# Patient Record
Sex: Female | Born: 1985 | Race: White | Hispanic: No | Smoking: Current every day smoker
Health system: Southern US, Community
[De-identification: ages and names within clinical notes are randomized; demographics above are authoritative.]

## PROBLEM LIST (undated history)

## (undated) DIAGNOSIS — C4490 Unspecified malignant neoplasm of skin, unspecified: Secondary | ICD-10-CM

## (undated) DIAGNOSIS — K589 Irritable bowel syndrome without diarrhea: Secondary | ICD-10-CM

## (undated) DIAGNOSIS — K219 Gastro-esophageal reflux disease without esophagitis: Secondary | ICD-10-CM

## (undated) DIAGNOSIS — F329 Major depressive disorder, single episode, unspecified: Secondary | ICD-10-CM

## (undated) DIAGNOSIS — M1990 Unspecified osteoarthritis, unspecified site: Secondary | ICD-10-CM

## (undated) DIAGNOSIS — R112 Nausea with vomiting, unspecified: Secondary | ICD-10-CM

## (undated) DIAGNOSIS — Z800 Family history of malignant neoplasm of digestive organs: Secondary | ICD-10-CM

## (undated) DIAGNOSIS — F419 Anxiety disorder, unspecified: Secondary | ICD-10-CM

## (undated) DIAGNOSIS — K529 Noninfective gastroenteritis and colitis, unspecified: Secondary | ICD-10-CM

## (undated) DIAGNOSIS — R197 Diarrhea, unspecified: Secondary | ICD-10-CM

## (undated) DIAGNOSIS — Z9889 Other specified postprocedural states: Secondary | ICD-10-CM

## (undated) DIAGNOSIS — N83209 Unspecified ovarian cyst, unspecified side: Secondary | ICD-10-CM

## (undated) DIAGNOSIS — J309 Allergic rhinitis, unspecified: Secondary | ICD-10-CM

## (undated) DIAGNOSIS — S6722XA Crushing injury of left hand, initial encounter: Secondary | ICD-10-CM

## (undated) DIAGNOSIS — M069 Rheumatoid arthritis, unspecified: Secondary | ICD-10-CM

## (undated) DIAGNOSIS — I10 Essential (primary) hypertension: Secondary | ICD-10-CM

## (undated) DIAGNOSIS — F909 Attention-deficit hyperactivity disorder, unspecified type: Secondary | ICD-10-CM

## (undated) DIAGNOSIS — G43909 Migraine, unspecified, not intractable, without status migrainosus: Secondary | ICD-10-CM

## (undated) DIAGNOSIS — M5417 Radiculopathy, lumbosacral region: Secondary | ICD-10-CM

## (undated) DIAGNOSIS — D510 Vitamin B12 deficiency anemia due to intrinsic factor deficiency: Secondary | ICD-10-CM

## (undated) DIAGNOSIS — F32A Depression, unspecified: Secondary | ICD-10-CM

## (undated) DIAGNOSIS — N809 Endometriosis, unspecified: Secondary | ICD-10-CM

## (undated) HISTORY — DX: Vitamin B12 deficiency anemia due to intrinsic factor deficiency: D51.0

## (undated) HISTORY — DX: Diarrhea, unspecified: R19.7

## (undated) HISTORY — DX: Unspecified osteoarthritis, unspecified site: M19.90

## (undated) HISTORY — PX: FRACTURE SURGERY: SHX138

## (undated) HISTORY — DX: Unspecified malignant neoplasm of skin, unspecified: C44.90

## (undated) HISTORY — DX: Irritable bowel syndrome, unspecified: K58.9

## (undated) HISTORY — DX: Family history of malignant neoplasm of digestive organs: Z80.0

## (undated) HISTORY — DX: Allergic rhinitis, unspecified: J30.9

## (undated) HISTORY — DX: Rheumatoid arthritis, unspecified: M06.9

## (undated) HISTORY — DX: Endometriosis, unspecified: N80.9

## (undated) HISTORY — PX: WISDOM TOOTH EXTRACTION: SHX21

## (undated) HISTORY — DX: Radiculopathy, lumbosacral region: M54.17

## (undated) HISTORY — DX: Essential (primary) hypertension: I10

## (undated) HISTORY — DX: Crushing injury of left hand, initial encounter: S67.22XA

## (undated) HISTORY — DX: Migraine, unspecified, not intractable, without status migrainosus: G43.909

## (undated) HISTORY — PX: SMALL INTESTINE SURGERY: SHX150

## (undated) HISTORY — DX: Noninfective gastroenteritis and colitis, unspecified: K52.9

## (undated) HISTORY — DX: Major depressive disorder, single episode, unspecified: F32.9

## (undated) HISTORY — DX: Unspecified ovarian cyst, unspecified side: N83.209

## (undated) HISTORY — DX: Attention-deficit hyperactivity disorder, unspecified type: F90.9

## (undated) HISTORY — PX: NASAL SINUS SURGERY: SHX719

## (undated) HISTORY — DX: Depression, unspecified: F32.A

## (undated) HISTORY — DX: Anxiety disorder, unspecified: F41.9

## (undated) HISTORY — PX: HAND SURGERY: SHX662

---
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
## 2006-04-28 DIAGNOSIS — N941 Unspecified dyspareunia: Secondary | ICD-10-CM | POA: Insufficient documentation

## 2006-12-28 ENCOUNTER — Emergency Department (HOSPITAL_COMMUNITY): Admission: EM | Admit: 2006-12-28 | Discharge: 2006-12-28 | Payer: Self-pay | Admitting: Emergency Medicine

## 2007-04-18 ENCOUNTER — Encounter: Admission: RE | Admit: 2007-04-18 | Discharge: 2007-04-18 | Payer: Self-pay | Admitting: Orthopaedic Surgery

## 2008-01-30 ENCOUNTER — Emergency Department (HOSPITAL_COMMUNITY): Admission: EM | Admit: 2008-01-30 | Discharge: 2008-01-31 | Payer: Self-pay | Admitting: Emergency Medicine

## 2008-06-06 ENCOUNTER — Emergency Department (HOSPITAL_COMMUNITY): Admission: EM | Admit: 2008-06-06 | Discharge: 2008-06-06 | Payer: Self-pay | Admitting: Family Medicine

## 2008-08-20 ENCOUNTER — Emergency Department (HOSPITAL_COMMUNITY): Admission: EM | Admit: 2008-08-20 | Discharge: 2008-08-20 | Payer: Self-pay | Admitting: Family Medicine

## 2008-10-26 HISTORY — PX: APPENDECTOMY: SHX54

## 2008-11-08 ENCOUNTER — Encounter (INDEPENDENT_AMBULATORY_CARE_PROVIDER_SITE_OTHER): Payer: Self-pay | Admitting: General Surgery

## 2008-11-08 ENCOUNTER — Observation Stay (HOSPITAL_COMMUNITY): Admission: EM | Admit: 2008-11-08 | Discharge: 2008-11-09 | Payer: Self-pay | Admitting: Emergency Medicine

## 2010-08-04 LAB — URINALYSIS, ROUTINE W REFLEX MICROSCOPIC
Bilirubin Urine: NEGATIVE
Glucose, UA: NEGATIVE mg/dL
Hgb urine dipstick: NEGATIVE
Ketones, ur: 15 mg/dL — AB
Protein, ur: NEGATIVE mg/dL

## 2010-08-04 LAB — GC/CHLAMYDIA PROBE AMP, GENITAL
Chlamydia, DNA Probe: NEGATIVE
GC Probe Amp, Genital: NEGATIVE

## 2010-08-04 LAB — COMPREHENSIVE METABOLIC PANEL
ALT: 20 U/L (ref 0–35)
AST: 27 U/L (ref 0–37)
Albumin: 4.1 g/dL (ref 3.5–5.2)
Alkaline Phosphatase: 42 U/L (ref 39–117)
BUN: 14 mg/dL (ref 6–23)
CO2: 22 mEq/L (ref 19–32)
Calcium: 9.7 mg/dL (ref 8.4–10.5)
Chloride: 107 mEq/L (ref 96–112)
Creatinine, Ser: 1.07 mg/dL (ref 0.4–1.2)
GFR calc Af Amer: >60 mL/min (ref >60)
GFR calc non Af Amer: >60 mL/min (ref >60)
Glucose, Bld: 108 mg/dL — ABNORMAL HIGH (ref 70–99)
Potassium: 4 mEq/L (ref 3.5–5.1)
Sodium: 138 mEq/L (ref 135–145)
Total Bilirubin: 0.9 mg/dL (ref 0.3–1.2)
Total Protein: 7.4 g/dL (ref 6.0–8.3)

## 2010-08-04 LAB — CBC
HCT: 42.7 % (ref 36.0–46.0)
Hemoglobin: 15 g/dL (ref 12.0–15.0)
MCHC: 35.2 g/dL (ref 30.0–36.0)
MCV: 87.1 fL (ref 78.0–100.0)
Platelets: 193 10*3/uL (ref 150–400)
RBC: 4.9 MIL/uL (ref 3.87–5.11)
RDW: 12.8 % (ref 11.5–15.5)
WBC: 9.7 10*3/uL (ref 4.0–10.5)

## 2010-08-04 LAB — DIFFERENTIAL
Basophils Absolute: 0.1 10*3/uL (ref 0.0–0.1)
Basophils Relative: 1 % (ref 0–1)
Eosinophils Absolute: 0.1 10*3/uL (ref 0.0–0.7)
Eosinophils Relative: 1 % (ref 0–5)
Lymphocytes Relative: 16 % (ref 12–46)
Lymphs Abs: 1.5 10*3/uL (ref 0.7–4.0)
Monocytes Absolute: 0.6 10*3/uL (ref 0.1–1.0)
Monocytes Relative: 7 % (ref 3–12)
Neutro Abs: 7.3 10*3/uL (ref 1.7–7.7)
Neutrophils Relative %: 76 % (ref 43–77)

## 2010-08-04 LAB — POCT PREGNANCY, URINE: Preg Test, Ur: NEGATIVE

## 2010-08-04 LAB — URINE CULTURE: Culture: NO GROWTH

## 2010-08-04 LAB — LIPASE, BLOOD: Lipase: 19 U/L (ref 11–59)

## 2010-09-10 NOTE — H&P (Signed)
[name], [name] NO.:  [id number]   MEDICAL RECORD NO.:  [id number]          PATIENT TYPE:  OBV   LOCATION:  5151                         FACILITY:  [facility]   PHYSICIAN:  Anselm Pancoast. Weatherly, M.D.DATE OF BIRTH:  [birth year]   DATE OF ADMISSION:  11/08/2008  DATE OF DISCHARGE:                              HISTORY & PHYSICAL   PRIMARY CARE PHYSICIAN:  Tammy R. Collins Scotland, MD   CHIEF COMPLAINT:  Right lower quadrant abdominal pain.   HISTORY OF PRESENT ILLNESS:  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  She was awakened  from sleep with upper abdominal pain more to the right upper and right  middle quadrants which was very severe in nature, began having nausea  and vomiting x1 and has had dry heaves since.  Later, she developed more  of a bilateral left lower quadrant pain, increased intensity, presented  to the ER.  White count was normal.  LFTs were normal.  Ultrasound was  negative for any gallbladder or other biliary issue.  Continued to have  pain, became more focused in the right lower quadrant, and clinical exam  also concurred with this.  So, EDP later ordered a CT of the abdomen and  pelvis which demonstrated acute appendicitis with an appendicolith.  Appendix was borderline and enlarged to 7 mm.  Previous pelvic exam  showed no evidence of cervical motion tenderness or PID.  Surgical  consultation has been requested.   PAST MEDICAL HISTORY:  1. Irritable bowel syndrome.  2. Ovarian cyst.  3. Mild depression and anxiety.   PAST SURGICAL HISTORY:  Back surgery.   FAMILY HISTORY:  Noncontributory.   SOCIAL HISTORY:  No alcohol.  No tobacco.  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  She also works as a Agricultural engineer  here at [employer] as previously mentioned.   DRUG ALLERGIES:  NKDA.   CURRENT MEDICATIONS:  She has been placed on citalopram by Dr. Alda Berthold  for anxiety and  depression issues.  She has had increasing stress with  her school work load.  Knows the citalopram had given her gastric  problems, so she came back to be reevaluated at the primary care  physician's office.  Dr. Collins Scotland was not there and the person who was  there ordered Seroquel.  The patient was reluctant to take this because  of side effects and has not started yet.   REVIEW OF SYSTEMS:  As per the history of present illness.  GI:  She has  had nausea and vomiting as described x1.  No chronic GI issues other  than the irritable bowel syndrome.  These symptoms are different than  her irritable bowel syndrome symptoms.  CONSTITUTIONAL:  She has not had  any fevers, chills, or myalgias.  GYN:  The patient has recurrent  ovarian cyst.  She is 2 weeks since her last menstrual period.  These  symptoms are not consistent with prior ovarian cyst symptoms.   PHYSICAL EXAMINATION:  GENERAL:  Pleasant but acutely ill-appearing  female patient  with a flushed-appearing face and complaining of severe  right lower quadrant abdominal pain.  VITAL SIGNS:  Temperature 98.3, BP 127/76, pulse 97 and regular, and  respirations 20.  PSYCH:  The patient is alert and oriented x3.  Affect appropriate to  current situation.  NEUROLOGIC:  Cranial nerves II-XII are grossly intact.  She is moving  all extremities x4 without any focal neurological deficits.  EYES:  Sclerae are mildly injected, nonicteric bilaterally.  EARS, NOSE, AND THROAT:  Ears are symmetrical.  No otorrhea.  Nose is  midline.  No rhinorrhea.  Oral mucous membranes are pink but dry.  CHEST:  Bilateral lung sounds are clear to auscultation anteriorly.  Respiratory effort is nonlabored.  She is on room air.  CARDIOVASCULAR:  Heart sounds are S1-S2.  No rubs, murmurs, clicks, or  gallops.  No JVD.  No peripheral edema.  ABDOMEN:  Soft, slightly distended.  Diminished bowel sounds.  Exquisitely tender in the right lower quadrant with voluntary  guarding  and some rebounding.  She has an umbilical piercing which I asked her to  remove and she gave this to her mother preoperatively.  EXTREMITIES:  Symmetrical in appearance without cyanosis or clubbing.   LABORATORY DATA:  White count 9700, neutrophils 76%, hemoglobin 15, and  platelets 193,000.  Urinalysis negative.  Wet prep only shows a few  WBCs, otherwise negative.  Sodium 138, potassium 4.0, CO2 22, glucose  108, BUN 14, and creatinine 1.07.  Lipase 19.  Ultrasound was negative.  CT again demonstrates the enlarged appendix at 7 mm with an  appendicolith consistent with early appendicitis.   IMPRESSION:  Early acute appendicitis.   PLAN:  Admit at this point on outpatient status.   PLAN:  1. Operative intervention today by Dr. Zachery Dakins.  Risks and benefits      of the procedure will be discussed per Dr. Zachery Dakins and the      patient will proceed pending acceptance.  2. Management of symptoms with IV fluids, IV narcotics, and IV      antiemetics.  3. Empiric Zosyn IV for broad-spectrum empiric antibiotic coverage.      Allison L. Rennis Harding, N.P.      Anselm Pancoast. Zachery Dakins, M.D.  Electronically Signed    ALE/MEDQ  D:  11/08/2008  T:  11/09/2008  Job:  [id number]   cc:   Tammy R. Collins Scotland, M.D.

## 2010-09-10 NOTE — Op Note (Signed)
Norma Harmon, [name] NO.:  [id number]   MEDICAL RECORD NO.:  [id number]          PATIENT TYPE:  OBV   LOCATION:  5151                         FACILITY:  [facility]   PHYSICIAN:  Anselm Pancoast. Weatherly, M.D.DATE OF BIRTH:  May 02, 1985   DATE OF PROCEDURE:  11/08/2008  DATE OF DISCHARGE:                               OPERATIVE REPORT   PREOPERATIVE DIAGNOSIS:  Acute appendicitis.   POSTOPERATIVE DIAGNOSIS:  Acute appendicitis.   OPERATION:  Laparoscopic appendectomy.   SURGEON:  Anselm Pancoast. Zachery Dakins, MD   ASSISTANT:  Letha Cape, PA   HISTORY:  Norma Harmon is a 25 year old female.  I think she is  actually a part-time employee here at [employer] who started having  abdominal pain yesterday, it kind of persisted, first it was kind of  more generalized and then kind of shifted to the lower right abdomen.  She came to the emergency room this morning, was seen by the ER  physician.  Originally, kind of wondered if this could be biliary and I  think an ultrasound of the gallbladder was obtained.  White count was  not elevated, but her pain was getting more in the lower abdomen, then a  CT was performed, which showed an enlarged appendix with fecalith in the  right lower quadrant.  The patient has had some previous issues with  abdominal pain and thought to be an ovarian cyst, but there was nothing  significant noted in the ovaries at this time.  We were asked to see her  and I agreed with the diagnosis and recommended to proceed with a  laparoscopic appendectomy.  The patient had no additional questions.  She was given 1 dose of Zosyn immediately preoperatively and taken to  the operative suite.  Induction of general anesthesia, endotracheal  tube, oral tube into the stomach, an a sterile Foley catheter was  inserted sterilely and then the abdomen was prepped with Betadine  solution and draped in the sterile manner.  A small incision was made  below the  umbilicus.  She has got real strong fascial layer and two  little Kocher were placed along the fascia of the underlying posterior  peritoneum, was picked up between 2 hemostats and then opened with a 15-  blade and then a pursestring suture of 0 Vicryl placed.  Hasson cannula  was introduced.  She has got a real low lying cecum and the 5-mm trocar  was placed under direct vision in the right lateral to the umbilicus and  the 10-11 was placed in the left lower quadrant under direct vision.  I  then was able to use a glass that allowed to kind of flipped the cecum  up, kind of manipulated the small bowel, so I could see this markedly  inflamed appendix, and the mesentery of the appendix was grasped and  then elevated.  Then with a Harmonic scalpel I divided the mesentery  under direct vision and it was inflamed right about 1 cm of this  junction with the most proximal portion of the appendix.  The mesentery  was divided, so I could get  a stapler across the base of the appendix-  cecal junction, clamped it, satisfied with the position.  I had looked  at the appendix placing the camera at the umbilicus and I had the camera  predominately in the left lower quadrant.  The stapler was fired.  The  appendix was then placed in the EndoCatch bag and then brought out the  EndoCatch bag with the inflamed appendix.  We reinserted the Hasson  cannula, looked at the cecal base.  There was no evidence of any  bleeding.  The staple line was good, controlled, a little bit of  irrigating fluid was used.  Next, I looked at the uterus and the left  and right ovaries, did not take any pictures and there was no evidence  of any cyst or anything on the ovaries or any evidence of any  inflammation around the fallopian tubes.  The CO2 was released and the  left lower quadrant port was withdrawn under direct vision.  No  bleeding.  I later put a fascial stitch in the anterior fascia under  direct vision.  The 5-mm  port was withdrawn under direct vision and then  the CO2 released.  The 10 mm trocar at the umbilicus  was withdrawn, and I put an additional figure-of-eight 0 Vicryl fascia  at the umbilicus and then tied both sutures.  The patient tolerated the  procedure nicely.  I did put Marcaine in the incisions, 5-0 Vicryl  subcuticular sutures, and then Benzoin and Steri-Strips on the skin.  The patient will spend a night and should be able to be discharged.      Anselm Pancoast. Zachery Dakins, M.D.  Electronically Signed     Anselm Pancoast. Zachery Dakins, M.D.  Electronically Signed    WJW/MEDQ  D:  11/08/2008  T:  11/09/2008  Job:  [id number]

## 2010-09-13 NOTE — Op Note (Signed)
Norma Harmon, Norma Harmon NO.:  [id number]   MEDICAL RECORD NO.:  [id number]          PATIENT TYPE:  INP   LOCATION:  6123                         FACILITY:  [facility]   PHYSICIAN:  Sharolyn Douglas, M.D.        DATE OF BIRTH:  07-09-1985   DATE OF PROCEDURE:  09/18/2005  DATE OF DISCHARGE:                                 OPERATIVE REPORT   PREOPERATIVE DIAGNOSES:  1.  L5-S1 spondylolysis.  2.  L5-S1 degenerative disc disease.  3.  L5-S1 facette arthropathy.   POSTOPERATIVE DIAGNOSES:  1.  L5-S1 spondylolysis.  2.  L5-S1 degenerative disc disease.  3.  L5-S1 facette arthropathy.   PROCEDURES:  1.  L5-S1 posterior spinal fusion.  2.  L5-S1 pedicle screw instrumentation using the Abbott Spine Pathfinder      system.  3.  Right posterior iliac crest bone graft.   SURGEON:  Sharolyn Douglas, M.D.   ASSISTANT:  Verlin Fester, P.A.   ANESTHESIA:  General endotracheal.   ESTIMATED BLOOD LOSS:  200 cc.   COMPLICATIONS:  None.   INDICATIONS:  The patient is a pleasant 25 year old female with chronic  progressive back and right leg pain.  Her radiographs and CT scan show pars  anomalies and facette arthropathy at L5-S1.  She has failed to respond to  conservative treatment measures and this point has elected to undergo  surgery in hopes of improving her symptoms.  I had talked to them  preoperatively about pars repair versus L5-S1 fusion.  We had hoped on being  able to do the pars repair to save the L5-S1 disc.  Intraoperatively, I  found the L5-S1 facette joints to be severely degenerative, right greater  than left.  There was a pars defect noted on the right side.  The left pars  was intact.  It was felt that repairing this pars defect would not  completely address the pathology.  I therefore scrubbed out and spoke to the  patient's mother and father and consented for the L5-S1 fusion with pedicle  screws.  Risks, benefits, and alternatives were discussed.   PROCEDURE:  The  patient was identified in the holding area and taken to the  operating room and underwent general endotracheal anesthesia without  difficulty, and given prophylactic IV antibiotics.  Neuro monitoring was  established in the form of lower extremity SSEPs and EMGs.  She was turned  prone onto the Booth frame.  All bony prominences were padded.  Face and  eyes were protected all times.  Back prepped and draped in the usual sterile  fashion.  Fluoroscopy was brought into the field to identify the L5-S1  level.  A midline skin incision was made from L4 down to the sacrum.  Dissection was carried through the deep fascia.  Subperiosteal exposure  carried out, exposing the facette joints of L5-S1 and the pars  interarticularis.  We found on the right side the facette joint was  enlarged, with osteophytes projecting towards the L4-5 joint.  The left  facette joint was also enlarged.  There was definitely some degree of  atropism.  The pars region was then examined.  On the left side, the pars  was found to be intact. On the right, it was found to be elongated, and a  small defect was noted.  Because of the amount of facette arthritis and  hypertrophy at L5-S1, it was not felt that repairing the pars on the right  side would address the pathology.  I therefore scrubbed out and spoke with  the patient's family.  We had discussed in the office the possibility of  lumbar fusion, and they had a good understanding.  They were consented for  the procedure.  I then returned to the operating room and completed the  operation.  Further dissection was carried out over the transverse processes  of L5 and the sacral ala bilaterally.  The facette joints were osteotomized  and decorticated.  Pedicle screws were placed at L5 and S1 using anatomic  probing technique.  Each pedicle's starting point was initiated with the awl  followed by the pedicle probe.  The pedicle holes were palpated with a ball-  tip feeler.  There were no breaches.  Each pedicle hole was tapped.  We  placed 6.5 x 40 mm screws in L5 bilaterally, 7.5 x 35 mm screws in the  sacrum.  We had excellent screw purchase.  The bone quality was good.  We  then decorticated the transverse processes of L5 and the sacral ala  bilaterally.  We then turned our attention to obtaining posterior iliac  crest bone graft.  The subcutaneous layer was elevated out to the right  posterior iliac crest.  Dissection was carried down through the deep fascia,  exposing the iliac crest.  The iliac crest cap was removed with a Leksell.  Copious amounts bone graft were removed from between the tables using a  curette.  Wound was irrigated.  The fascia was closed with #1 Vicryl suture.  We then returned to the midline incision.  The autogenous bone graft was  packed tightly between the L5 transverse process and the sacral ala.  5 cc  of Grafton allograft was supplemented.  We then placed the 50 mm titanium  rod into the polyaxial screw heads.  The locking caps were sheared off.  The  wound was irrigated.  Deep fascia was closed with a #1 Vicryl suture.  Subcutaneous layer was closed with 2-0 Vicryl followed by a running 3-0  subcuticular Vicryl suture on the skin edges.  Benzoin and Steri-Strips  placed and sterile dressing applied.  The patient was turned supine,  extubated without difficulty, transferred to recovery in stable condition.  Needle and sponge count was correct.  My assistant helped throughout the  procedure, including positioning during exposure, helping with retraction  and suction, and also with wound closure.      Sharolyn Douglas, M.D.  Electronically Signed     MC/MEDQ  D:  09/18/2005  T:  09/19/2005  Job:  [id number]

## 2010-09-13 NOTE — H&P (Signed)
Norma Harmon, LATKA NO.:  [id number]   MEDICAL RECORD NO.:  [id number]          PATIENT TYPE:  INP   LOCATION:  6123                         FACILITY:  [facility]   PHYSICIAN:  Sharolyn Douglas, M.D.        DATE OF BIRTH:  August 19, 1985   DATE OF ADMISSION:  09/18/2005  DATE OF DISCHARGE:  09/20/2005                                HISTORY & PHYSICAL   CHIEF COMPLAINT:  Low back pain.   HISTORY OF PRESENT ILLNESS:  The patient is an 24 year old female who was  found to have a possible L5 pars fracture and has been having longstanding  back pain.  She has failed to improve with any conservative treatment and it  was thought her best course of management would be L5 pars fracture repair.  Risks and benefits of this procedure were discussed with the patient and her  family today by Dr. Noel Gerold as well as myself and they indicated understanding  and opted to proceed.   ALLERGIES:  None.   MEDICATIONS:  Erythromycin.   PAST MEDICAL HISTORY:  Healthy.   PAST SURGICAL HISTORY:  None.   SOCIAL HISTORY:  The patient denies tobacco or alcohol use.  She is single.  She is a Printmaker at [employer].   FAMILY MEDICAL HISTORY:  Noncontributory.   REVIEW OF SYSTEMS:  Negative.   PHYSICAL EXAMINATION:  VITAL SIGNS:  Pulse is 72 and regular, respirations  16 and unlabored, blood pressure is 118/72.  GENERAL APPEARANCE:  The patient is an 25 year old white female who is alert  and oriented, in no acute distress, well-nourished, well-groomed, appears  stated age, and was cooperative with exam.  HEENT:  Head is normocephalic, atraumatic.  Pupils are equal, round and  reactive to light and accommodation.  Extraocular movements intact.  Nares  patent.  Pharynx is clear.  NECK:  Soft to palpation, no lymphadenopathy or thyromegaly noted.  No  bruits appreciated.  CHEST:  Clear to auscultation bilaterally.  No rales, rhonchi, stridor or  wheezes, no friction rubs.  BREASTS:  Not  pertinent and not performed.  HEART:  S1 and S2.  Regular rate and rhythm.  No murmurs, gallops or rubs  noted.  ABDOMEN:  Soft to palpation, nontender, non-distended and no organomegaly  noted.  Positive bowel sounds throughout.  GU:  Not pertinent and not performed.  EXTREMITIES:  As per HPI.  SKIN:  Intact, without any lesions or rashes.   LABORATORY AND ACCESSORY CLINICAL DATA:  CT suggests L5 pars fracture.   IMPRESSION:  L5 pars fracture with chronic back pain.   PLAN:  Admit to [facility] on Sep 18, 2005 for an L5 pars repair by  Dr. Noel Gerold.      Verlin Fester, P.A.      Sharolyn Douglas, M.D.  Electronically Signed    CM/MEDQ  D:  09/26/2005  T:  09/26/2005  Job:  [id number]

## 2010-11-17 ENCOUNTER — Emergency Department (HOSPITAL_COMMUNITY)
Admission: EM | Admit: 2010-11-17 | Discharge: 2010-11-17 | Disposition: A | Discharge status: Home / Self Care | Payer: 59 | Attending: Emergency Medicine | Admitting: Emergency Medicine

## 2010-11-17 ENCOUNTER — Emergency Department (HOSPITAL_COMMUNITY): Payer: 59

## 2010-11-17 ENCOUNTER — Emergency Department (HOSPITAL_COMMUNITY)
Admission: EM | Admit: 2010-11-17 | Discharge: 2010-11-18 | Disposition: A | Discharge status: Home / Self Care | Payer: 59 | Attending: Emergency Medicine | Admitting: Emergency Medicine

## 2010-11-17 DIAGNOSIS — R1031 Right lower quadrant pain: Secondary | ICD-10-CM | POA: Insufficient documentation

## 2010-11-17 DIAGNOSIS — R0602 Shortness of breath: Secondary | ICD-10-CM | POA: Insufficient documentation

## 2010-11-17 DIAGNOSIS — R109 Unspecified abdominal pain: Secondary | ICD-10-CM | POA: Insufficient documentation

## 2010-11-17 DIAGNOSIS — R112 Nausea with vomiting, unspecified: Secondary | ICD-10-CM | POA: Insufficient documentation

## 2010-11-17 DIAGNOSIS — K921 Melena: Secondary | ICD-10-CM | POA: Insufficient documentation

## 2010-11-17 DIAGNOSIS — R002 Palpitations: Secondary | ICD-10-CM | POA: Insufficient documentation

## 2010-11-17 DIAGNOSIS — R141 Gas pain: Secondary | ICD-10-CM | POA: Insufficient documentation

## 2010-11-17 DIAGNOSIS — R142 Eructation: Secondary | ICD-10-CM | POA: Insufficient documentation

## 2010-11-17 DIAGNOSIS — R197 Diarrhea, unspecified: Secondary | ICD-10-CM | POA: Insufficient documentation

## 2010-11-17 DIAGNOSIS — K589 Irritable bowel syndrome without diarrhea: Secondary | ICD-10-CM | POA: Insufficient documentation

## 2010-11-17 DIAGNOSIS — R110 Nausea: Secondary | ICD-10-CM | POA: Insufficient documentation

## 2010-11-17 DIAGNOSIS — K5289 Other specified noninfective gastroenteritis and colitis: Secondary | ICD-10-CM | POA: Insufficient documentation

## 2010-11-17 LAB — COMPREHENSIVE METABOLIC PANEL
ALT: 12 U/L (ref 0–35)
CO2: 25 mEq/L (ref 19–32)
Calcium: 9.5 mg/dL (ref 8.4–10.5)
Chloride: 104 mEq/L (ref 96–112)
GFR calc Af Amer: >60 mL/min (ref >60)
GFR calc non Af Amer: >60 mL/min (ref >60)
Glucose, Bld: 81 mg/dL (ref 70–99)
Sodium: 138 mEq/L (ref 135–145)
Total Bilirubin: 0.3 mg/dL (ref 0.3–1.2)

## 2010-11-17 LAB — DIFFERENTIAL
Lymphocytes Relative: 17 % (ref 12–46)
Monocytes Absolute: 0.8 10*3/uL (ref 0.1–1.0)
Monocytes Relative: 11 % (ref 3–12)
Neutro Abs: 5.1 10*3/uL (ref 1.7–7.7)

## 2010-11-17 LAB — URINALYSIS, ROUTINE W REFLEX MICROSCOPIC
Glucose, UA: NEGATIVE mg/dL
Hgb urine dipstick: NEGATIVE
Ketones, ur: NEGATIVE mg/dL
Protein, ur: NEGATIVE mg/dL
pH: 5.5 (ref 5.0–8.0)

## 2010-11-17 LAB — CBC
HCT: 40.8 % (ref 36.0–46.0)
Hemoglobin: 14.1 g/dL (ref 12.0–15.0)
MCH: 29.9 pg (ref 26.0–34.0)
MCHC: 34.6 g/dL (ref 30.0–36.0)

## 2010-11-17 MED ORDER — IOHEXOL 300 MG/ML  SOLN
100.0000 mL | Freq: Once | INTRAMUSCULAR | Status: AC | PRN
  Administered 2010-11-17: 100 mL via INTRAVENOUS

## 2010-11-18 ENCOUNTER — Telehealth: Payer: Self-pay | Admitting: Internal Medicine

## 2010-11-18 NOTE — Telephone Encounter (Signed)
Spoke with patient and scheduled her to see Dr. Juanda Chance on 11/20/10 at 3:30 PM. Patient states she thinks she saw a GI doctor when she was in middle school. She will try to get those records prior to the OV on 11/20/10.

## 2010-11-20 ENCOUNTER — Ambulatory Visit (INDEPENDENT_AMBULATORY_CARE_PROVIDER_SITE_OTHER): Payer: 59 | Admitting: Internal Medicine

## 2010-11-20 ENCOUNTER — Encounter: Payer: Self-pay | Admitting: Internal Medicine

## 2010-11-20 VITALS — BP 110/68 | HR 64 | Ht 65.5 in | Wt 142.0 lb

## 2010-11-20 DIAGNOSIS — R933 Abnormal findings on diagnostic imaging of other parts of digestive tract: Secondary | ICD-10-CM

## 2010-11-20 DIAGNOSIS — R1031 Right lower quadrant pain: Secondary | ICD-10-CM

## 2010-11-20 MED ORDER — HYDROCODONE-ACETAMINOPHEN 5-325 MG PO TABS: ORAL_TABLET | ORAL | Status: DC

## 2010-11-20 MED ORDER — PREDNISONE 10 MG PO TABS: ORAL_TABLET | ORAL | Status: DC

## 2010-11-20 MED ORDER — ALPRAZOLAM 0.25 MG PO TABS: ORAL_TABLET | ORAL | Status: DC

## 2010-11-20 MED ORDER — ONDANSETRON HCL 4 MG PO TABS: 4.0000 mg | ORAL_TABLET | Freq: Three times a day (TID) | ORAL | Status: DC | PRN

## 2010-11-20 NOTE — Progress Notes (Signed)
Norma Harmon 03-31-1986 MRN [id number]     History of Present Illness:  This is a 25 year old white female referred from the emergency room where she was evaluated on July 22nd for an abnormal CT scan of the abdomen and pelvis showing an active inflammatory process in the right lower quadrant and terminal ileum, some abnormal loops of the small bowel suggesting delayed transit consistent with focal enteritis suspicious for Crohn's disease. She has had 2 prior episodes of right lower quadrant abdominal pain in 2009 and again in 2010 when she underwent an appendectomy. For the past month she has experienced some diarrhea and abdominal pain. Her labs in the emergency room were normal. She was treated with Cipro twice a day and Flagyl 3 times a day which she is taking currently. This is day # 3 of antibiotics. She still has severe right lower quadrant abdominal pain for which she takes hydrocodone. She has had small amounts of diarrhea but no nausea or vomiting. There has been no fever. She  has also taken Zofran 8 mg when necessary for nausea. Her weight was 162 pounds 2 years ago. She is currently 142 pounds.   Past Medical History  Diagnosis Date  . IBS (irritable bowel syndrome)   . Ovarian cyst    Past Surgical History  Procedure Date  . Appendectomy 2010  . Back surgery     L5-S1    reports that she quit smoking about 6 months ago. Her smoking use included Cigarettes. She has never used smokeless tobacco. She reports that she drinks alcohol. She reports that she does not use illicit drugs. family history includes Breast cancer in her paternal grandmother; Cirrhosis in her maternal grandfather; Colon cancer in her paternal grandfather; Colon polyps in her paternal grandmother; Diabetes in her maternal grandmother; and Heart disease in her maternal grandmother. Allergies  Allergen Reactions  . Percocet (Oxycodone-Acetaminophen)         Review of Systems: Denies vomiting but has  had nausea. No fever. No chest pain or shortness of breath  The remainder of the 10  point ROS is negative except as outlined in H&P   Physical Exam: General appearance  Well developed in no distress Eyes- non icteric HEENT nontraumatic, normocephalic Mouth no lesions, tongue papillated, no cheilosis, no aphthous ulcers Neck supple without adenopathy, thyroid not enlarged, no carotid bruits, no JVD Lungs Clear to auscultation bilaterally Cor normal S1 normal S2, regular rhythm , no murmur,  quiet precordium Abdomen soft abdomen with marked tenderness in the right lower quadrant at membranous point. No fullness or mass. No rebound. Bowel sounds are normoactive. Left lower and upper quadrants unremarkable. There is no CVA tenderness. Rectal: Soft Hemoccult negative stool. No perianal disease. Extremities no pedal edema. Skin no lesions. Neurological alert and oriented x 3. Psychological normal mood and affect.  Assessment and Plan:  Problem #1 Right lower quadrant abdominal pain with acute exacerbation. There have been 2 prior episodes of right lower quadrant abdominal pain with  CT scans not showing any inflammatory process c/w Crohn's disease.. She is status post appendectomy 2 years ago. Her symptoms and CT scan findings are suggestive of Crohn's disease. I cannot rule out an infectious process , she has not so far responded to Cipro and Flagyl. There is a partial distal small bowel obstruction as per CT scan with delayed transit time. We will start steroids including prednisone 40 mg daily. I have discussed the possibility of taking Entocort but favor the use of  prednisone. She will use Xanax 0.25 mg when necessary for anxiety. She will stay on full liquids and low fiber diet and complete the course of antibiotics. I will see her again in 2 weeks. If she is much improved, we will consider a colonoscopy. I have discussed at length with her steroids and her steroid taper as well as side effects  of steroids.   11/20/2010 [name]

## 2010-11-20 NOTE — Patient Instructions (Signed)
We have sent the following medications to your pharmacy for you to pick up at your convenience: Prednisone 10 mg. Take 2 tablets by mouth twice daily x 2 weeks (40mg ), then take 2 tablets by mouth every morning and 1 tablet by mouth every evening x 2 weeks. Hydrocodone 5/325 mg. Take 1 tablet by mouth every 4-6 hours as needed. Zofran 4 mg. Take 1 tablet by mouth every 8 hours as needed for nausea. Xanax 0.25 mg. Take 1 tablet by mouth every 8 hours as needed. Continue your antibiotics. Follow a full liquid diet and increase to low residue diet as tolerated (We have given you brochures on this). We have given you an excuse for work. 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 We have scheduled you for an appointment for follow up with Dr Juanda Chance in 2 weeks, 12/04/10 @ 9:00 am.

## 2010-11-22 ENCOUNTER — Telehealth: Payer: Self-pay | Admitting: *Deleted

## 2010-11-22 NOTE — Telephone Encounter (Signed)
We got a prior authorization request from [facility] for ondansetron for patient. I have contacted [employer] regarding the authorization. I was on hold for 32 minutes and was then hung up on. I will attempt to call back on Monday.

## 2010-11-26 NOTE — Telephone Encounter (Signed)
OK to send Phenergan 12,5 mg, #20, 1 po q 4-6 hrs prn

## 2010-11-26 NOTE — Telephone Encounter (Signed)
Dr Juanda Chance, patient's insurance has denied ondansetron because she does not have a diagnosis of hyperemesis gravidarum and is not receiving chemotherapy or radiation therapy. Do you want to try her on phenergan or reglan?

## 2010-11-26 NOTE — Telephone Encounter (Signed)
Sent prior authorization form. Awaiting response from insurance company.

## 2010-11-27 ENCOUNTER — Telehealth: Payer: Self-pay | Admitting: Internal Medicine

## 2010-11-27 MED ORDER — PROMETHAZINE HCL 12.5 MG PO TABS: ORAL_TABLET | ORAL | Status: DC

## 2010-11-27 NOTE — Telephone Encounter (Signed)
Forwarded to Dr. Brodie for Review °

## 2010-11-27 NOTE — Telephone Encounter (Signed)
I have left a voicemail for patient that her insurance will not cover zofran, therefore, we will send phenergan instead.

## 2010-12-04 ENCOUNTER — Encounter: Payer: Self-pay | Admitting: Internal Medicine

## 2010-12-04 ENCOUNTER — Ambulatory Visit (INDEPENDENT_AMBULATORY_CARE_PROVIDER_SITE_OTHER): Payer: 59 | Admitting: Internal Medicine

## 2010-12-04 ENCOUNTER — Telehealth: Payer: Self-pay | Admitting: Internal Medicine

## 2010-12-04 DIAGNOSIS — R197 Diarrhea, unspecified: Secondary | ICD-10-CM

## 2010-12-04 DIAGNOSIS — R933 Abnormal findings on diagnostic imaging of other parts of digestive tract: Secondary | ICD-10-CM

## 2010-12-04 MED ORDER — HYDROCODONE-ACETAMINOPHEN 5-325 MG PO TABS: ORAL_TABLET | ORAL | Status: DC

## 2010-12-04 MED ORDER — PEG-KCL-NACL-NASULF-NA ASC-C 100 G PO SOLR: 1.0000 | Freq: Once | ORAL | Status: DC

## 2010-12-04 NOTE — Telephone Encounter (Signed)
Patient states that she recently went from 2 tablets bid of prednisone down to 2 tablets in am and 1 tablet in pm. States she was starting to feel better on the 2 tab bid dosing but once she went down to 3 tabs daily, she relapsed. She was wondering if Dr Juanda Chance thinks this could have anything to do with her starting to have abdominal pain again.

## 2010-12-04 NOTE — Progress Notes (Signed)
Norma Harmon [birth year] MRN [id number]    History of Present Illness:  This is a 25 year old white female with right lower quadrant abdominal pain. He rlast appointment was 3 weeks ago. She was started on prednisone 40 mg a day with initial improvement in abdominal pain but has had a relapse of pain in the last several days. She continues to have diarrhea. She notes pelvic pain on the right side when she inserts a tampon. Her gynecological exam was normal and she denies any vaginal discharge, fever or rectal bleeding. She is currently on prednisone 30 mg a day. She is tolerating full liquids and soft foods but is unable to tolerate high roughage foods. There has been no nausea or vomiting. She has continued to lose weight and is currently 134 pounds.   Past Medical History  Diagnosis Date  . IBS (irritable bowel syndrome)   . Ovarian cyst   . Diarrhea    Past Surgical History  Procedure Date  . Appendectomy 2010  . Back surgery     L5-S1    reports that she quit smoking about 7 months ago. Her smoking use included Cigarettes. She has never used smokeless tobacco. She reports that she drinks alcohol. She reports that she does not use illicit drugs. family history includes Breast cancer in her paternal grandmother; Cirrhosis in her maternal grandfather; Colon cancer in her paternal grandfather; Colon polyps in her paternal grandmother; Diabetes in her maternal grandmother; and Heart disease in her maternal grandmother. Allergies  Allergen Reactions  . Percocet (Oxycodone-Acetaminophen)         Review of Systems: Denies dysphagia odynophagia or shortness of breath or chest pain.  The remainder of the 10  point ROS is negative except as outlined in H&P   Physical Exam: General appearance  Well developed, in no distress. Eyes- non icteric. HEENT nontraumatic, normocephalic. Mouth no lesions, tongue papillated, no cheilosis. Neck supple without adenopathy, thyroid not  enlarged, no carotid bruits, no JVD. Lungs Clear to auscultation bilaterally. Cor normal S1 normal S2, regular rhythm , no murmur,  quiet precordium. Abdomen scaphoid. Soft. Marked tenderness in the right lower quadrant without palpable mass normoactive bowel sounds. No rebound.  Rectal: Not done. Extremities no pedal edema. Skin no lesions. Neurological alert and oriented x 3. Psychological normal mood and affect.  Assessment and Plan:  Problem #1 Inflammatory process in the right lower quadrant of the pelvis consistent with inflammatory bowel disease. This has been poorly responsive to steroids. We need to rule out a partial terminal ileal obstruction. We will proceed with a colonoscopy and biopsies. She is to continue prednisone 30 mg a day. We may need to consider repeating a CT scan or obtaining a small bowel follow-through to assess the terminal ileum for obstruction. We may also need to consider adding Asacol depending on the findings of her colonoscopy.   12/04/2010 [name]

## 2010-12-04 NOTE — Telephone Encounter (Signed)
Spoke with pt, she will go up to 40mg /day

## 2010-12-04 NOTE — Patient Instructions (Addendum)
You have been scheduled for a colonoscopy. Please follow written instructions given to you at your visit today.  Please pick up your Moviprep kit at the pharmacy within the next 2-3 days. We have sent the following medications to your pharmacy for you to pick up at your convenience: Hydrocodone

## 2010-12-06 ENCOUNTER — Telehealth: Payer: Self-pay | Admitting: Internal Medicine

## 2010-12-06 ENCOUNTER — Ambulatory Visit (AMBULATORY_SURGERY_CENTER): Payer: 59 | Admitting: Internal Medicine

## 2010-12-06 ENCOUNTER — Encounter: Payer: Self-pay | Admitting: Internal Medicine

## 2010-12-06 VITALS — BP 120/74 | HR 62 | Temp 97.5°F | Resp 13 | Ht 65.0 in | Wt 127.0 lb

## 2010-12-06 DIAGNOSIS — R197 Diarrhea, unspecified: Secondary | ICD-10-CM

## 2010-12-06 DIAGNOSIS — R933 Abnormal findings on diagnostic imaging of other parts of digestive tract: Secondary | ICD-10-CM

## 2010-12-06 DIAGNOSIS — K5289 Other specified noninfective gastroenteritis and colitis: Secondary | ICD-10-CM

## 2010-12-06 DIAGNOSIS — K5000 Crohn's disease of small intestine without complications: Secondary | ICD-10-CM

## 2010-12-06 MED ORDER — SODIUM CHLORIDE 0.9 % IV SOLN: 500.0000 mL | INTRAVENOUS | Status: DC

## 2010-12-06 MED ORDER — METRONIDAZOLE 250 MG PO TABS: 250.0000 mg | ORAL_TABLET | Freq: Three times a day (TID) | ORAL | Status: AC

## 2010-12-06 NOTE — Telephone Encounter (Signed)
Patient called after having a colonoscopy today, which on review of the note, revealed ileitis.   She reports having received the rx for prednisone, metronidazole, but not for asacol. On review of the colon report, Dr. Juanda Chance indicates Asacol HD 2 tabs po BID.   I will alert Dr. Juanda Chance to this call and she can rx the med on Monday if needed. I advised the patient that going the next 2 days without this medication should not adversely affect her medical condition.  She voiced understanding and thanked me for the call.

## 2010-12-09 ENCOUNTER — Telehealth: Payer: Self-pay | Admitting: *Deleted

## 2010-12-09 NOTE — Telephone Encounter (Signed)
Follow up Call- Patient questions:  Do you have a fever, pain , or abdominal swelling? yes Pain Score  6 *  Have you tolerated food without any problems? no  Have you been able to return to your normal activities? no  Do you have any questions about your discharge instructions: Diet   no Medications  no Follow up visit  no  Do you have questions or concerns about your Care? no  Actions: * If pain score is 4 or above: Physician/ provider Notified : [name], MD.  Patient states that it's the same pain that she has had all along.  States still can't eat well, but can eat a little.  Will notify Dr. Juanda Chance when she comes in this am.   Patient states no need to call Dr. Nor to make another appt. To see her.  Will add documentation if further orders are received,

## 2010-12-11 ENCOUNTER — Telehealth: Payer: Self-pay | Admitting: Internal Medicine

## 2010-12-11 MED ORDER — MESALAMINE 800 MG PO TBEC: DELAYED_RELEASE_TABLET | ORAL | Status: DC

## 2010-12-11 NOTE — Telephone Encounter (Signed)
Patient notified of Dr. Brodie's recommendations. 

## 2010-12-11 NOTE — Telephone Encounter (Signed)
OK to go down on Prednisone to 30 mg/day. I will call her with results

## 2010-12-11 NOTE — Telephone Encounter (Signed)
Spoke with patient and told her I do not have her results back yet. Patient states she did not get an rx for the Asacol HD 2 po BID last week after her procedure as stated in her report. Rx sent to patient's pharmacy. She also reports since going back up to Prednisone 40 mg/ day, she has started feeling "jittery" and is wondering if she can decrease the dose. Please, advise.

## 2010-12-12 ENCOUNTER — Encounter: Payer: Self-pay | Admitting: Internal Medicine

## 2010-12-13 ENCOUNTER — Encounter: Payer: Self-pay | Admitting: *Deleted

## 2010-12-13 ENCOUNTER — Telehealth: Payer: Self-pay | Admitting: Internal Medicine

## 2010-12-13 NOTE — Telephone Encounter (Signed)
Patient given results as per Dr. Regino Schultze letter. Scheduled patient for OV on 01/15/11 at 8:30 AM.

## 2010-12-17 ENCOUNTER — Other Ambulatory Visit: Payer: Self-pay | Admitting: Internal Medicine

## 2010-12-17 ENCOUNTER — Telehealth: Payer: Self-pay | Admitting: Internal Medicine

## 2010-12-17 NOTE — Telephone Encounter (Signed)
Spoke with patient. She states for the last 2 days, her stomach has felt like it is in "knots," States she is still not eating much. She is taking her Asacol 2 tabs BID. She has finished her antibiotic. She is on Prednisone 30 mg/day( She decreased from 40 mg/day last week due to jittery feeling) and wants to know if she is to stay on this until her OV on 01/15/11. Please, advise.

## 2010-12-17 NOTE — Telephone Encounter (Signed)
Left a message for patient to call me. 

## 2010-12-17 NOTE — Telephone Encounter (Signed)
I have spoken to the pt, she is somewhat better but not as well as I was expecting her to be on Prednisone 30mg /day. She has lost 3 more lbs, stays on liquids. Please schedule her for SBFT any and let her know. She will stay on Pred.30 mg x 1 more week, then start tapering down.

## 2010-12-17 NOTE — Telephone Encounter (Signed)
Scheduled patient for SBFT at [facility] radiology on 12/23/10 9:00 AM. NPO after midnight. Patient notified.

## 2010-12-23 ENCOUNTER — Ambulatory Visit (HOSPITAL_COMMUNITY)
Admission: RE | Admit: 2010-12-23 | Discharge: 2010-12-23 | Disposition: A | Discharge status: Home / Self Care | Payer: 59 | Source: Ambulatory Visit | Attending: Internal Medicine | Admitting: Internal Medicine

## 2010-12-23 DIAGNOSIS — R1031 Right lower quadrant pain: Secondary | ICD-10-CM | POA: Insufficient documentation

## 2010-12-24 ENCOUNTER — Telehealth: Payer: Self-pay | Admitting: *Deleted

## 2010-12-24 NOTE — Telephone Encounter (Signed)
Dr. Juanda Chance, These are the choices for IBD markers . Please tell me which one you would like: 1. [facility] Diagnostic- includes serology, genetic and inflammation markers to help differentiate IBD vs non-IBD and Crohn's disease vs UC. 2.[facility] Add [facility] Crohn's Prognostic- If [facility] indicates Crohn's disease 3. [facility] Add [facility] Celiac Serology- If [facility] indicated non-IBD

## 2010-12-24 NOTE — Telephone Encounter (Signed)
Message copied by Daphine Deutscher on Tue Dec 24, 2010  1:11 PM ------      Message from: Hart Carwin      Created: Tue Dec 24, 2010  9:53 AM       Please call pt with results of SBFT which confirm a narrow segment of the TI about 11/2 inch long consistent with Crohn's disease. Stay on Prednisone , please, as we planned, low residue diet. OV  In next  2-3 weeks. To discuss possible surgery. Vs new treatment ( such as remicade). Please obtain IBD markers

## 2010-12-24 NOTE — Telephone Encounter (Signed)
The # 1. db

## 2010-12-25 NOTE — Telephone Encounter (Signed)
The abnormal CT scan and small bowl x-ray are not consistent with celiac disease. Never the less, please order a sprue panel before IBD markers, please.

## 2010-12-25 NOTE — Telephone Encounter (Signed)
Spoke with patient and gave her the results and recommendations by Dr. Juanda Chance. Patient states she has been reading some articles and is wondering if she could have an gluten allergy. She wants to know if she should be tested for this before having the expensive IBD markers. Please, advise.

## 2010-12-26 ENCOUNTER — Telehealth: Payer: Self-pay | Admitting: Internal Medicine

## 2010-12-26 NOTE — Telephone Encounter (Signed)
Spoke with patient and she will come tomorrow for sprue panel.

## 2010-12-27 ENCOUNTER — Other Ambulatory Visit: Payer: 59

## 2010-12-31 ENCOUNTER — Telehealth: Payer: Self-pay | Admitting: Internal Medicine

## 2010-12-31 LAB — GLIA (IGA/G) + TTG IGA: Tissue Transglutaminase Ab, IgA: 3.2 U/mL (ref <20)

## 2011-01-01 ENCOUNTER — Telehealth: Payer: Self-pay | Admitting: *Deleted

## 2011-01-01 MED ORDER — HYDROCODONE-ACETAMINOPHEN 5-325 MG PO TABS: ORAL_TABLET | ORAL | Status: DC

## 2011-01-01 MED ORDER — DOXYCYCLINE HYCLATE 100 MG PO TABS: ORAL_TABLET | ORAL | Status: DC

## 2011-01-01 NOTE — Telephone Encounter (Signed)
Left a message for patient to call me. 

## 2011-01-01 NOTE — Telephone Encounter (Signed)
See previous phone note.  

## 2011-01-01 NOTE — Telephone Encounter (Signed)
Message copied by Daphine Deutscher on Wed Jan 01, 2011  8:12 AM ------      Message from: Hart Carwin      Created: Tue Dec 31, 2010  6:09 PM       I have discussed negative celiac profile with the pt. She is still having pain, across lower abdomen, running low grade temperature. Down to Prednisone 25 mg, will start 20 mg /day tomorrow. Please refill her pain meds ( Oxycodone) and start Doxycycline 100 mg, #30 1 po qd, She has an appointment on Sept 18. We discussed surgery.

## 2011-01-01 NOTE — Telephone Encounter (Signed)
Spoke with patient and verified with her that her pain medication is Hydrocodone. Rx sent to her pharmacy as requested.

## 2011-01-10 ENCOUNTER — Other Ambulatory Visit: Payer: Self-pay | Admitting: Internal Medicine

## 2011-01-15 ENCOUNTER — Other Ambulatory Visit: Payer: Self-pay | Admitting: *Deleted

## 2011-01-15 ENCOUNTER — Encounter: Payer: Self-pay | Admitting: Internal Medicine

## 2011-01-15 ENCOUNTER — Ambulatory Visit (INDEPENDENT_AMBULATORY_CARE_PROVIDER_SITE_OTHER): Payer: 59 | Admitting: Internal Medicine

## 2011-01-15 ENCOUNTER — Telehealth: Payer: Self-pay | Admitting: *Deleted

## 2011-01-15 ENCOUNTER — Other Ambulatory Visit (INDEPENDENT_AMBULATORY_CARE_PROVIDER_SITE_OTHER): Payer: 59

## 2011-01-15 DIAGNOSIS — K56609 Unspecified intestinal obstruction, unspecified as to partial versus complete obstruction: Secondary | ICD-10-CM

## 2011-01-15 DIAGNOSIS — R933 Abnormal findings on diagnostic imaging of other parts of digestive tract: Secondary | ICD-10-CM

## 2011-01-15 DIAGNOSIS — K5010 Crohn's disease of large intestine without complications: Secondary | ICD-10-CM

## 2011-01-15 DIAGNOSIS — E538 Deficiency of other specified B group vitamins: Secondary | ICD-10-CM

## 2011-01-15 LAB — COMPREHENSIVE METABOLIC PANEL
AST: 15 U/L (ref 0–37)
Albumin: 4.1 g/dL (ref 3.5–5.2)
Alkaline Phosphatase: 26 U/L — ABNORMAL LOW (ref 39–117)
BUN: 11 mg/dL (ref 6–23)
Calcium: 9.3 mg/dL (ref 8.4–10.5)
Chloride: 107 mEq/L (ref 96–112)
Glucose, Bld: 83 mg/dL (ref 70–99)
Potassium: 3.7 mEq/L (ref 3.5–5.1)
Sodium: 141 mEq/L (ref 135–145)
Total Protein: 6.7 g/dL (ref 6.0–8.3)

## 2011-01-15 LAB — CBC WITH DIFFERENTIAL/PLATELET
Basophils Absolute: 0 10*3/uL (ref 0.0–0.1)
Eosinophils Relative: 0.2 % (ref 0.0–5.0)
HCT: 40.5 % (ref 36.0–46.0)
Hemoglobin: 13.5 g/dL (ref 12.0–15.0)
Lymphs Abs: 1.3 10*3/uL (ref 0.7–4.0)
MCV: 91.1 fl (ref 78.0–100.0)
Monocytes Absolute: 0.4 10*3/uL (ref 0.1–1.0)
Neutro Abs: 5.4 10*3/uL (ref 1.4–7.7)
Platelets: 196 10*3/uL (ref 150.0–400.0)
RDW: 15.3 % — ABNORMAL HIGH (ref 11.5–14.6)

## 2011-01-15 LAB — IBC PANEL: Iron: 91 ug/dL (ref 42–145)

## 2011-01-15 MED ORDER — OMEPRAZOLE 20 MG PO CPDR: 20.0000 mg | DELAYED_RELEASE_CAPSULE | Freq: Every day | ORAL | Status: DC

## 2011-01-15 MED ORDER — CYANOCOBALAMIN 1000 MCG/ML IJ SOLN
1000.0000 ug | INTRAMUSCULAR | Status: DC
  Administered 2011-01-17: 1000 ug via INTRAMUSCULAR

## 2011-01-15 NOTE — Progress Notes (Signed)
Norma Harmon 09/02/1985 MRN [id number]    History of Present Illness:  This is a 25 year old white female with right lower quadrant abdominal pain and now diffuse abdominal pain which occurs in relationship to meals as early as during the meal and as late as several hours after eating. She continues to lose weight from an initial 140 pounds to a current 122 pounds. She is afraid to eat because of abdominal pain. There has been no fever or rectal bleeding. A small bowel follow through recently showed a narrow terminal ileum suggestive of possible partial obstruction but no evidence of dilatation of the small bowel. There was a 3 cm segment of beaking of the terminal ileum. A CT scan of the abdomen showed an inflammatory process in the right lower quadrant with thickening of the terminal ileum. There was no fistula. A colonoscopy on 12/06/2010 showed ileitis and focal colitis but endoscopically, the mucosa appeared unremarkable. She had a tubular adenoma removed. She is currently on Asacol HD 800 mg 2 tablets twice a day, doxycycline 100 mg a day and a prednisone taper from 40 mg mg to a current 10 mg a day. Although prednisone initially helped somewhat, it has not truly resolved all the pain and diarrhea. She denies upper stomatitis but has low back pain.   Past Medical History  Diagnosis Date  . IBS (irritable bowel syndrome)   . Ovarian cyst   . Diarrhea   . Arthritis     BACK  . Crohn's   . Family history of malignant neoplasm of gastrointestinal tract    Past Surgical History  Procedure Date  . Appendectomy 2010  . Back surgery     L5-S1  . Wisdom tooth extraction     reports that she quit smoking about 8 months ago. Her smoking use included Cigarettes. She has never used smokeless tobacco. She reports that she does not drink alcohol or use illicit drugs. family history includes Breast cancer in her paternal grandmother; Cirrhosis in her maternal grandfather; Colon cancer in her  paternal grandfather; Colon polyps in her paternal grandmother; Diabetes in her maternal grandmother; and Heart disease in her maternal grandmother. Allergies  Allergen Reactions  . Percocet (Oxycodone-Acetaminophen)         Review of Systems: Positive for heartburn indigestion abdominal pain The remainder of the 10  point ROS is negative except as outlined in H&P   Physical Exam: General appearance  Well developed, in no distress. Eyes- non icteric. HEENT nontraumatic, normocephalic. Mouth no lesions, tongue papillated, no cheilosis. Neck supple without adenopathy, thyroid not enlarged, no carotid bruits, no JVD. Lungs Clear to auscultation bilaterally. Cor normal S1 normal S2, regular rhythm , no murmur,  quiet precordium. Abdomen soft flat with normoactive bowel sounds. Diffusely tender, more so in the right lower quadrant. No palpable mass. Rectal: Not repeated. Extremities no pedal edema. Skin no lesions, tattoo on right lower quadrant. Neurological alert and oriented x 3. Psychological normal mood and affect.  Assessment and Plan:  Problem #1 Persistent radiographic abnormality of the terminal ileum associated with right lower quadrant abdominal pain and weight loss consistent with inflammatory bowel disease. We have not ordered IBD markers because of the expense to the patient and the fact that even if they were negative, we would move ahead with our recommendations . I feel confident that we are dealing with Crohn's disease with a stricture which has not responded to conservative measures. She has been having attacks of abdominal pain now for  several years and I suspect that she has a fixed stricture rather than edema I am skeptical that Imuran or Remicade would eliminate the structural abnormality.. For that reason, after long discussion with the patient and her mother I recommend  surgical resection of the terminal ileum. We will go ahead and make an appointment for a surgical  consultation. We will recheck her metabolic panel, iron studies, Z61 and CBC. He will be started on Prilosec 20 mg a day.   01/15/2011 Norma Harmon

## 2011-01-15 NOTE — Telephone Encounter (Signed)
Message copied by Daphine Deutscher on Wed Jan 15, 2011  2:32 PM ------      Message from: Hart Carwin      Created: Wed Jan 15, 2011  1:33 PM       Please call pt with normal results except for B12 219 pg. Please start B12 1000ugIM weekly x 4, then monthly. Iron level and blood count are normal.

## 2011-01-15 NOTE — Patient Instructions (Signed)
Your physician has requested that you go to the basement for the following lab work before leaving today: CMET, IBC, B12, Sedimentation Rate, CBC. We have sent the following medications to your pharmacy for you to pick up at your convenience: Prilosec 20 mg daily. You have been scheduled for an appointment with Dr Luretha Murphy at [facility]. Your appointment is on 02/12/11 Wednesday at 10:00 am. Please arrive at 9:30 am for registration. Make certain to bring a list of current medications, including any over the counter medications or vitamins. Also bring your co-pay if you have one as well as your insurance cards. [facility] is located at [address], [address]. Should you need to reschedule your appointment, please contact them at [phone]. CC: Dr Luretha Murphy

## 2011-01-15 NOTE — Telephone Encounter (Signed)
Spoke with patient and gave her results and recommendations. Scheduled for first B 12 injection on 01/17/11 at 9:45 AM. Orders in EPIC

## 2011-01-17 ENCOUNTER — Telehealth: Payer: Self-pay | Admitting: Internal Medicine

## 2011-01-17 ENCOUNTER — Ambulatory Visit (INDEPENDENT_AMBULATORY_CARE_PROVIDER_SITE_OTHER): Payer: 59 | Admitting: Internal Medicine

## 2011-01-17 DIAGNOSIS — D519 Vitamin B12 deficiency anemia, unspecified: Secondary | ICD-10-CM

## 2011-01-17 DIAGNOSIS — E538 Deficiency of other specified B group vitamins: Secondary | ICD-10-CM

## 2011-01-17 MED ORDER — CYANOCOBALAMIN 1000 MCG/ML IJ SOLN: 1000.0000 ug | Freq: Once | INTRAMUSCULAR | Status: DC

## 2011-01-17 NOTE — Telephone Encounter (Signed)
Spoke with Dr. Juanda Chance and she states Prilosec is the most likely cause. Stop Prilosec. Patient notified.

## 2011-01-17 NOTE — Telephone Encounter (Signed)
Patient calling to report red bumps on her lower neck and stomach that are rash like in appearance. It does not itch. She had her first Vitamin B 12 injection this AM at 9:45 and she started on Prilosec on Wednesday. She is wondering if either of these could have caused the rash. Please, advise.

## 2011-01-20 ENCOUNTER — Telehealth: Payer: Self-pay | Admitting: Internal Medicine

## 2011-01-20 MED ORDER — SYRINGE (DISPOSABLE) 3 ML MISC: Status: DC

## 2011-01-20 NOTE — Telephone Encounter (Signed)
Pt reported we did not order any needles or syringes for her- ordered. Pt also reports we only gave her 10 individual vials and how long should she take them- she has surgery soon. Advised pt to call before her last injection for advice on f/u labs, OV , etc; pt stated understanding.

## 2011-01-22 ENCOUNTER — Other Ambulatory Visit: Payer: Self-pay | Admitting: Internal Medicine

## 2011-01-23 ENCOUNTER — Other Ambulatory Visit: Payer: Self-pay | Admitting: Internal Medicine

## 2011-01-23 MED ORDER — HYDROCODONE-ACETAMINOPHEN 5-325 MG PO TABS: ORAL_TABLET | ORAL | Status: DC

## 2011-01-23 NOTE — Telephone Encounter (Signed)
Advised pt rx was sent already.

## 2011-01-23 NOTE — Telephone Encounter (Signed)
OK to refill, she is awaiting surgical consultation

## 2011-01-23 NOTE — Telephone Encounter (Signed)
rx sent

## 2011-01-23 NOTE — Telephone Encounter (Signed)
Dr Juanda Chance- Patient requests more refills on hydrocodone. However, she got #20 on 12/17/10, #20 on 01/01/11 and #20 on 01/10/11. Do you want me to refill again this early? If so, do you want me to give her more than #20 ?

## 2011-01-27 LAB — DIFFERENTIAL
Eosinophils Absolute: 0.1
Eosinophils Relative: 1
Lymphocytes Relative: 30
Lymphs Abs: 2.1
Monocytes Absolute: 0.6
Monocytes Relative: 8

## 2011-01-27 LAB — CBC
MCV: 88.2
Platelets: 206
RDW: 12.3
WBC: 6.8

## 2011-01-27 LAB — URINALYSIS, ROUTINE W REFLEX MICROSCOPIC
Bilirubin Urine: NEGATIVE
Glucose, UA: NEGATIVE
Hgb urine dipstick: NEGATIVE
Ketones, ur: NEGATIVE
Nitrite: NEGATIVE
Specific Gravity, Urine: 1.022
pH: 6

## 2011-01-27 LAB — WET PREP, GENITAL: Trich, Wet Prep: NONE SEEN

## 2011-01-27 LAB — POCT I-STAT, CHEM 8
BUN: 9
Chloride: 112
Creatinine, Ser: 1
Glucose, Bld: 100 — ABNORMAL HIGH
Hemoglobin: 13.6
Potassium: 3.6
Sodium: 141

## 2011-01-27 LAB — GC/CHLAMYDIA PROBE AMP, GENITAL: Chlamydia, DNA Probe: NEGATIVE

## 2011-01-27 LAB — LIPASE, BLOOD: Lipase: 21

## 2011-01-28 ENCOUNTER — Encounter: Payer: Self-pay | Admitting: Family Medicine

## 2011-01-28 ENCOUNTER — Ambulatory Visit (INDEPENDENT_AMBULATORY_CARE_PROVIDER_SITE_OTHER): Payer: 59 | Admitting: Family Medicine

## 2011-01-28 DIAGNOSIS — R0789 Other chest pain: Secondary | ICD-10-CM

## 2011-01-28 DIAGNOSIS — Z23 Encounter for immunization: Secondary | ICD-10-CM

## 2011-01-28 DIAGNOSIS — R0602 Shortness of breath: Secondary | ICD-10-CM

## 2011-01-28 DIAGNOSIS — Z720 Tobacco use: Secondary | ICD-10-CM

## 2011-01-28 DIAGNOSIS — K5090 Crohn's disease, unspecified, without complications: Secondary | ICD-10-CM

## 2011-01-28 DIAGNOSIS — M549 Dorsalgia, unspecified: Secondary | ICD-10-CM

## 2011-01-28 DIAGNOSIS — R300 Dysuria: Secondary | ICD-10-CM

## 2011-01-28 DIAGNOSIS — F17200 Nicotine dependence, unspecified, uncomplicated: Secondary | ICD-10-CM

## 2011-01-28 LAB — POCT URINALYSIS DIPSTICK
Bilirubin, UA: NEGATIVE
Blood, UA: NEGATIVE
Nitrite, UA: NEGATIVE
Protein, UA: NEGATIVE
pH, UA: 6

## 2011-01-28 MED ORDER — VARENICLINE TARTRATE 1 MG PO TABS: 1.0000 mg | ORAL_TABLET | Freq: Two times a day (BID) | ORAL | Status: DC

## 2011-01-28 NOTE — Progress Notes (Signed)
Subjective:    Patient ID: Norma Harmon, female    DOB: 1985/06/12, 25 y.o.   MRN: [id number]  HPI Norma Harmon Is a 25 year old, divorced female, who comes in today as a new patient for evaluation of multiple issues.  She states she was diagnosed this summer by Dr. Dickie Harmon to have Crohn's disease and is due to see Dr. Daphine Harmon at October, the 17th to consider surgery.  She is currently tapering prednisone and is on a Asacol.  She's had trouble with left upper anterior chest wall pain for about two weeks.  She says it comes and goes.  It feels sharp.  She also states she sometimes feels short of breath.  She also has left and right back pain.  This has been going on for about 3 days.  She describes as dull and varies in a scale of one to 5.  The pain is decreased by rest and increased by bending.  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  The birth control.  She uses the ring......... Pap sprain 2012 normal.  Family history negative for bowel disease.  Tetanus booster unknown.......... We will give her a booster today.  She smokes, but she is decreased her smoking down to two cigarettes a day.  We discussed a smoking cessation program and will start her on chantix today   Review of Systems  Constitutional: Positive for fatigue.  HENT: Negative.   Eyes: Negative.   Respiratory: Positive for shortness of breath.   Cardiovascular: Positive for chest pain.  Gastrointestinal: Positive for nausea and abdominal pain.  Genitourinary: Negative.   Musculoskeletal: Negative.   Skin:       History of skin cancer left abdominal wall, type unknown.  Asked her to get the records  Neurological: Negative.   Hematological: Negative.   Psychiatric/Behavioral: Negative.        Objective:   Physical Exam  Constitutional: She appears well-developed and well-nourished.  HENT:  Head: Normocephalic and atraumatic.  Right Ear: External ear normal.  Left  Ear: External ear normal.  Nose: Nose normal.  Mouth/Throat: Oropharynx is clear and moist.  Eyes: EOM are normal. Pupils are equal, round, and reactive to light.  Neck: Normal range of motion. Neck supple. No JVD present. No tracheal deviation present. No thyromegaly present.  Cardiovascular: Normal rate, regular rhythm, normal heart sounds and intact distal pulses.  Exam reveals no gallop and no friction rub.   No murmur heard. Pulmonary/Chest: Effort normal and breath sounds normal. No stridor. No respiratory distress. She has no wheezes. She has no rales. She exhibits no tenderness.  Abdominal: Soft. Bowel sounds are normal. She exhibits no distension and no mass. There is tenderness. There is no rebound and no guarding.  Genitourinary:       Bilateral breast exam shows multiple fibrocystic changes also, the area that she is having the chest wall pain.  There is a pea-sized, fibrocystic lesion in her right breast at the 9 o'clock position.  Musculoskeletal: Normal range of motion.  Lymphadenopathy:    She has no cervical adenopathy.  Neurological: She is alert. She has normal reflexes. No cranial nerve deficit. She exhibits normal muscle tone. Coordination normal.  Skin: Skin is warm and dry.       Scar left abdomen demand from previous skin cancer removal....... She's unsure of the type asked her to get her medical records  Psychiatric: She has a normal mood and affect.  Her behavior is normal. Judgment and thought content normal.          Assessment & Plan:  Healthy female.  Crohn's disease, followed by Dr. Dickie Harmon, and surgical consult with Dr. Daphine Harmon in October.  Fibrocystic breasts.  Advise BSE monthly.  Back pain secondary to Crohn's disease.  Continue Vicodin per Dr. Marian Harmon directions....... She takes two tabs daily after work.  History of skin cancer asked her to get her medical records.  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  Status post  appendectomy.  Tobacco abuse start on chantix one half tab daily explained she must stop smoking.

## 2011-01-28 NOTE — Patient Instructions (Signed)
Start the Chantix..............Norma Harmon takes one half tablet daily.  Return to see me in two weeks for follow-up.  In the meantime get a copy of all your records from the dermatologist concerning the skin cancer.  Bring those records which you to your next visit.  Do a thorough breast exam monthly......... You have multiple fibrocystic changes, as we discussed.  Wear sunscreens SPF 50 and going forward.  We need to see you in the fall for a complete examination.  Return in the spring for your Pap smear and refill on your birth control

## 2011-01-29 ENCOUNTER — Telehealth: Payer: Self-pay | Admitting: *Deleted

## 2011-01-29 NOTE — Telephone Encounter (Signed)
patient  Was unable to get the Chantix filled because of cost.  However she is going to try to stop smoking on her own.  FYI

## 2011-01-29 NOTE — Telephone Encounter (Signed)
ok 

## 2011-02-07 ENCOUNTER — Other Ambulatory Visit: Payer: Self-pay | Admitting: Internal Medicine

## 2011-02-11 ENCOUNTER — Ambulatory Visit (INDEPENDENT_AMBULATORY_CARE_PROVIDER_SITE_OTHER): Payer: 59 | Admitting: Family Medicine

## 2011-02-11 ENCOUNTER — Encounter: Payer: Self-pay | Admitting: Family Medicine

## 2011-02-11 ENCOUNTER — Encounter (INDEPENDENT_AMBULATORY_CARE_PROVIDER_SITE_OTHER): Payer: Self-pay | Admitting: Surgery

## 2011-02-11 DIAGNOSIS — Z720 Tobacco use: Secondary | ICD-10-CM | POA: Insufficient documentation

## 2011-02-11 DIAGNOSIS — F909 Attention-deficit hyperactivity disorder, unspecified type: Secondary | ICD-10-CM

## 2011-02-11 DIAGNOSIS — F17200 Nicotine dependence, unspecified, uncomplicated: Secondary | ICD-10-CM

## 2011-02-11 MED ORDER — METHYLPHENIDATE HCL 10 MG PO TABS: ORAL_TABLET | ORAL | Status: DC

## 2011-02-11 MED ORDER — VARENICLINE TARTRATE 1 MG PO TABS: 1.0000 mg | ORAL_TABLET | Freq: Two times a day (BID) | ORAL | Status: DC

## 2011-02-11 NOTE — Progress Notes (Signed)
  Subjective:    Patient ID: Norma Harmon, female    DOB: 12-16-1985, 25 y.o.   MRN: [id number]  HPI  Norma Harmon is a delightful 25 year old female......... Ex smoker x 2 weeks via cold [place],,,,, who comes in today for follow-up.  She cannot afford the chantix, but she stop smoking completely on her own.  I discussed with buying the product at [facility] and using a smaller number and to only take a half a tablet a day.  She went to [employer] had a 4.0 however, when she went to [employer].  She did not do well in school.  She finds focusing concentration and reading very difficult.  Now she is in a job where she has to go to class and read material and she is having a great deal of difficulty staying on point.  She wonders if she might have some underlying ADD.  Her brother has ADD and    Review of Systems    General review of systems otherwise Objective:   Physical Exam Well-developed well-nourished, female in acute distress       Assessment & Plan:  Tobacco abuse, ex-smoker......... Trial of chantix one half tab daily.  Probable ADD......... Trial of Ritalin, 10 mg b.i.d........... She will check with her insurance company to see what product to cover

## 2011-02-11 NOTE — Patient Instructions (Signed)
Began the Ritalin, 10 mg twice daily.  Also, consider the chantix one half tab daily to help.  He is off your nicotine   Return in two weeks for follow-up.  Check with your insurance company to see what products they would cover for the ADD

## 2011-02-12 ENCOUNTER — Encounter (INDEPENDENT_AMBULATORY_CARE_PROVIDER_SITE_OTHER): Payer: Self-pay | Admitting: Surgery

## 2011-02-12 ENCOUNTER — Ambulatory Visit (INDEPENDENT_AMBULATORY_CARE_PROVIDER_SITE_OTHER): Payer: 59 | Admitting: Surgery

## 2011-02-12 ENCOUNTER — Other Ambulatory Visit: Payer: Self-pay | Admitting: Internal Medicine

## 2011-02-12 VITALS — BP 124/88 | HR 76 | Temp 98.3°F | Resp 12 | Ht 65.5 in | Wt 122.4 lb

## 2011-02-12 DIAGNOSIS — K5000 Crohn's disease of small intestine without complications: Secondary | ICD-10-CM

## 2011-02-12 NOTE — Progress Notes (Signed)
Subjective:     Patient ID: Norma Harmon, female   DOB: October 07, 1985, 25 y.o.   MRN: [id number]  HPI  25 year old white female with a history of RLQ abdominal pain going back to the seventh grade when she lost 40 lbs over a summer with similar pain.  This has recurred and is very similar.  She had a lap appendectomy 10/2008 by Zachery Dakins which showed acute appendicitis and not Crohn's.  She has developed terminal ileitis and has been treated with some success but she still hurts and she wants to have this area resected.  Patient Active Problem List  Diagnoses  . Crohn's disease  . Chest pain, atypical  . SOB (shortness of breath)  . Back pain  . Tobacco abuse  . ADD (attention deficit disorder with hyperactivity)     Current Outpatient Prescriptions  Medication Sig Dispense Refill  . ASACOL HD 800 MG TBEC TAKE 2 TABLETS BY MOUTH TWICE DAILY  120 tablet  0  . B-D INSULIN SYRINGE 1CC/25GX1" 25G X 1" 1 ML MISC       . cyanocobalamin (,VITAMIN B-12,) 1000 MCG/ML injection Inject 1 mL (1,000 mcg total) into the muscle once.  10 mL  0  . HYDROcodone-acetaminophen (NORCO) 5-325 MG per tablet TAKE 1 TABLET BY MOUTH EVERY 4 HOURS AS NEEDED FOR PAIN  20 tablet  0  . methylphenidate (RITALIN) 10 MG tablet One p.o. B.i.d.  60 tablet  0  . NUVARING 0.12-0.015 MG/24HR vaginal ring       . promethazine (PHENERGAN) 12.5 MG tablet Take 1 tablet by mouth every 4-6 hours as needed. (pharmacy-please d/c rx for zofran. Insurance will not cover)  20 tablet  0  . Syringe, Disposable, 3 ML MISC Use syringe for B12 injections: 1000ug weekly x 4 then monthly  25 each  0  . varenicline (CHANTIX CONTINUING MONTH PAK) 1 MG tablet Take 1 tablet (1 mg total) by mouth 2 (two) times daily.  15 tablet  3   Current Facility-Administered Medications  Medication Dose Route Frequency Provider Last Rate Last Dose  . cyanocobalamin ((VITAMIN B-12)) injection 1,000 mcg  1,000 mcg Intramuscular Weekly Hart Carwin, MD   1,000  mcg at 01/17/11 1008   Allergies  Allergen Reactions  . Percocet (Oxycodone-Acetaminophen)    Past Medical History  Diagnosis Date  . IBS (irritable bowel syndrome)   . Ovarian cyst   . Diarrhea   . Arthritis     BACK  . Crohn's   . Family history of malignant neoplasm of gastrointestinal tract   . Skin cancer    Past Surgical History  Procedure Date  . Back surgery     L5-S1  . Wisdom tooth extraction   . Appendectomy 10/2008  . Spine surgery 08/2005    L5 - S1 fusion     Review of Systems  Constitutional: Positive for activity change and appetite change.  HENT: Negative.   Eyes: Negative.   Respiratory: Negative.   Cardiovascular: Negative.   Gastrointestinal: Positive for vomiting and abdominal pain.  Genitourinary: Negative.   Musculoskeletal: Negative.   Skin: Negative.   Neurological: Negative.   Hematological: Negative.   Psychiatric/Behavioral: Negative.        Objective:   Physical Exam  Constitutional: She is oriented to person, place, and time. She appears well-developed and well-nourished.  HENT:  Head: Normocephalic and atraumatic.  Eyes: Conjunctivae are normal. Pupils are equal, round, and reactive to light.  Neck: Normal range  of motion. Neck supple.  Cardiovascular: Normal rate, regular rhythm and normal heart sounds.   Pulmonary/Chest: Effort normal and breath sounds normal.  Abdominal: Soft. There is tenderness.       RLQ pain to palpation  Musculoskeletal: Normal range of motion.  Neurological: She is alert and oriented to person, place, and time.  Skin: Skin is warm and dry.  Psychiatric: She has a normal mood and affect. Her behavior is normal. Judgment and thought content normal.       Assessment:     Crohn's of terminal ileum.  I have discussed lap assisted ileo cecectomy with her and her mother including risk and benefits with chance for recurrent disease of the small bowel      Plan:     Lap assisted right ileocecectomy

## 2011-02-13 ENCOUNTER — Other Ambulatory Visit (INDEPENDENT_AMBULATORY_CARE_PROVIDER_SITE_OTHER): Payer: Self-pay | Admitting: General Surgery

## 2011-02-14 ENCOUNTER — Other Ambulatory Visit (INDEPENDENT_AMBULATORY_CARE_PROVIDER_SITE_OTHER): Payer: Self-pay | Admitting: General Surgery

## 2011-02-14 ENCOUNTER — Telehealth: Payer: Self-pay | Admitting: Internal Medicine

## 2011-02-14 MED ORDER — PREDNISONE 10 MG PO TABS: ORAL_TABLET | ORAL | Status: DC

## 2011-02-14 NOTE — Telephone Encounter (Signed)
Reviewed and agree.

## 2011-02-14 NOTE — Telephone Encounter (Signed)
Patient calling to report on episode of blood in stool with bowel movement and when she wiped. She has had another bowel movement with no blood in it since this. Also reports increase in right lower abdominal pain and left upper abdominal pain. Some nausea but not vomiting. No fever. Bowel movements are normal but not as frequent as usual. ?had a virus last weekend. Not eating well. Taking Asacol 2 tab/day. Not on Prednisone but did feel better when on it. She is going out of town for 2 weeks. She is scheduled for resection of terminal ileum on 04/02/11. Hx crohns stricture. Spoke with Mike Gip, PA re: patient. Use pain medication as needed(patient states she had this), bland to soft diet, Prednisone 20 mg/day x 2 weeks, then 15 mg x 2 weeks then 10 mg x  2 weeks. Call us back if not better next week. Rx sent to pharmacy Patient aware. She will call if she is not better next week.

## 2011-02-19 ENCOUNTER — Telehealth: Payer: Self-pay | Admitting: Internal Medicine

## 2011-02-19 ENCOUNTER — Other Ambulatory Visit (INDEPENDENT_AMBULATORY_CARE_PROVIDER_SITE_OTHER): Payer: Self-pay | Admitting: General Surgery

## 2011-02-19 ENCOUNTER — Telehealth (INDEPENDENT_AMBULATORY_CARE_PROVIDER_SITE_OTHER): Payer: Self-pay | Admitting: General Surgery

## 2011-02-19 DIAGNOSIS — K5090 Crohn's disease, unspecified, without complications: Secondary | ICD-10-CM

## 2011-02-19 NOTE — Telephone Encounter (Signed)
Spoke with Feliz Beam [facility]/ rx called for nulytley and Erythromycin 1gm #3, Neomycin 1gm #3 take at 3pm 6pm 10pm

## 2011-02-19 NOTE — Telephone Encounter (Signed)
Dr Juanda Chance, do you want me to go ahead and fill rx? #20 last given 02/07/11.

## 2011-02-19 NOTE — Telephone Encounter (Signed)
OK to refill Hydrocodone 

## 2011-02-20 MED ORDER — HYDROCODONE-ACETAMINOPHEN 5-325 MG PO TABS
1.0000 | ORAL_TABLET | ORAL | Status: DC | PRN
Start: 2011-02-20 — End: 2011-03-10

## 2011-02-20 NOTE — Telephone Encounter (Signed)
rx sent. Patient advised. 

## 2011-03-10 ENCOUNTER — Ambulatory Visit (INDEPENDENT_AMBULATORY_CARE_PROVIDER_SITE_OTHER): Payer: 59 | Admitting: Family Medicine

## 2011-03-10 ENCOUNTER — Encounter: Payer: Self-pay | Admitting: Family Medicine

## 2011-03-10 DIAGNOSIS — K5090 Crohn's disease, unspecified, without complications: Secondary | ICD-10-CM

## 2011-03-10 DIAGNOSIS — F909 Attention-deficit hyperactivity disorder, unspecified type: Secondary | ICD-10-CM

## 2011-03-10 MED ORDER — AMPHETAMINE-DEXTROAMPHET ER 30 MG PO CP24: 30.0000 mg | ORAL_CAPSULE | ORAL | Status: DC

## 2011-03-10 MED ORDER — HYDROCODONE-ACETAMINOPHEN 5-325 MG PO TABS: 1.0000 | ORAL_TABLET | ORAL | Status: DC | PRN

## 2011-03-10 NOTE — Patient Instructions (Signed)
Start the long-acting Adderall 30 mg daily.  Return after surgery for follow-up

## 2011-03-10 NOTE — Progress Notes (Signed)
  Subjective:    Patient ID: [name], female    DOB: Nov 16, 1985, 25 y.o.   MRN: [id number]  HPI K. Is a 25 year old single female now a nonsmoker...Marland KitchenMarland KitchenMarland Kitchen Anticipating partial colectomy for ulcerative colitis December, the fifth......... Who comes in today for follow-up.  She was congratulated on staying away from smoking.  She did not have to take the Chantix  She just stopped.  We started her on 10 mg of Ritalin b.i.d. Because of ADD.  She says it helped tremendously with focus and concentration.  Her only lasted for a couple hours.  She check with her insurance company and was told they do cover the long-acting Adderall.  We will therefore start her on 30 mg daily.  She is out of the pain medication that Dr. Dickie La gave her.  She wants to know if we can refill her medication or she needs to see Dr. Dickie La.  I refilled her medicine for   Review of Systems    General psychiatric his systems otherwise negative Objective:   Physical Exam  Well-developed well-nourished, female in acute distress      Assessment & Plan:  Adult ADD, start Adderall 30 mg long-acting daily.  Follow-up after surgery.  Crohn's disease with a focal abnormality anticipating 4 inches of bowel removal December the fifth refilled Vicodin, number 50 no refills

## 2011-03-18 ENCOUNTER — Emergency Department (HOSPITAL_COMMUNITY): Payer: 59

## 2011-03-18 ENCOUNTER — Encounter (HOSPITAL_COMMUNITY): Payer: Self-pay | Admitting: Emergency Medicine

## 2011-03-18 ENCOUNTER — Emergency Department (HOSPITAL_COMMUNITY)
Admission: EM | Admit: 2011-03-18 | Discharge: 2011-03-18 | Disposition: A | Discharge status: Home / Self Care | Payer: 59 | Attending: Emergency Medicine | Admitting: Emergency Medicine

## 2011-03-18 ENCOUNTER — Telehealth: Payer: Self-pay | Admitting: Internal Medicine

## 2011-03-18 DIAGNOSIS — K5090 Crohn's disease, unspecified, without complications: Secondary | ICD-10-CM | POA: Insufficient documentation

## 2011-03-18 DIAGNOSIS — R10813 Right lower quadrant abdominal tenderness: Secondary | ICD-10-CM | POA: Insufficient documentation

## 2011-03-18 DIAGNOSIS — R109 Unspecified abdominal pain: Secondary | ICD-10-CM | POA: Insufficient documentation

## 2011-03-18 LAB — URINE MICROSCOPIC-ADD ON

## 2011-03-18 LAB — URINALYSIS, ROUTINE W REFLEX MICROSCOPIC
Glucose, UA: NEGATIVE mg/dL
pH: 7 (ref 5.0–8.0)

## 2011-03-18 LAB — DIFFERENTIAL
Eosinophils Relative: 1 % (ref 0–5)
Lymphocytes Relative: 28 % (ref 12–46)
Lymphs Abs: 2.1 10*3/uL (ref 0.7–4.0)
Neutrophils Relative %: 64 % (ref 43–77)

## 2011-03-18 LAB — CBC
Hemoglobin: 13.3 g/dL (ref 12.0–15.0)
MCV: 93.2 fL (ref 78.0–100.0)
Platelets: 210 10*3/uL (ref 150–400)
RBC: 4.29 MIL/uL (ref 3.87–5.11)
WBC: 7.5 10*3/uL (ref 4.0–10.5)

## 2011-03-18 LAB — POCT PREGNANCY, URINE: Preg Test, Ur: NEGATIVE

## 2011-03-18 LAB — POCT I-STAT, CHEM 8
Chloride: 101 mEq/L (ref 96–112)
HCT: 41 % (ref 36.0–46.0)
Potassium: 3.5 mEq/L (ref 3.5–5.1)

## 2011-03-18 MED ORDER — SODIUM CHLORIDE 0.9 % IV SOLN
INTRAVENOUS | Status: DC
  Administered 2011-03-18: 13:00:00 via INTRAVENOUS

## 2011-03-18 MED ORDER — HYDROMORPHONE HCL PF 2 MG/ML IJ SOLN
INTRAMUSCULAR | Status: AC
  Administered 2011-03-18: 1 mg via INTRAVENOUS
  Filled 2011-03-18: qty 1

## 2011-03-18 MED ORDER — IOHEXOL 300 MG/ML  SOLN
100.0000 mL | Freq: Once | INTRAMUSCULAR | Status: AC | PRN
  Administered 2011-03-18: 100 mL via INTRAVENOUS

## 2011-03-18 MED ORDER — METHYLPREDNISOLONE SODIUM SUCC 40 MG IJ SOLR
20.0000 mg | Freq: Once | INTRAMUSCULAR | Status: AC
  Administered 2011-03-18: 13:00:00 via INTRAVENOUS
  Filled 2011-03-18: qty 1

## 2011-03-18 MED ORDER — HYDROMORPHONE HCL PF 1 MG/ML IJ SOLN: 1.0000 mg | Freq: Once | INTRAMUSCULAR | Status: AC

## 2011-03-18 MED ORDER — ONDANSETRON HCL 4 MG/2ML IJ SOLN
4.0000 mg | Freq: Once | INTRAMUSCULAR | Status: AC
  Administered 2011-03-18: 4 mg via INTRAVENOUS
  Filled 2011-03-18: qty 2

## 2011-03-18 MED ORDER — PROMETHAZINE HCL 25 MG/ML IJ SOLN
12.5000 mg | Freq: Once | INTRAMUSCULAR | Status: AC
  Administered 2011-03-18: 12.5 mg via INTRAVENOUS
  Filled 2011-03-18 (×2): qty 1

## 2011-03-18 MED ORDER — ONDANSETRON HCL 4 MG/2ML IJ SOLN
INTRAMUSCULAR | Status: AC
  Administered 2011-03-18: 4 mg via INTRAVENOUS
  Filled 2011-03-18: qty 2

## 2011-03-18 NOTE — ED Notes (Signed)
Pt ambulatory to [facility]

## 2011-03-18 NOTE — ED Notes (Signed)
MD at bedside. 

## 2011-03-18 NOTE — ED Provider Notes (Signed)
History     CSN: [id number] Arrival date & time: 03/18/2011 10:39 AM   First MD Initiated Contact with Patient 03/18/11 1140      Chief Complaint  Patient presents with  . Abdominal Pain    (Consider location/radiation/quality/duration/timing/severity/associated sxs/prior treatment) HPI Complains of abdominal pain right lower quadrant nonradiating onset this morning accompanied by one episode of vomiting. Last bowel movement yesterday, small amount. No treatment prior to coming here as she could not hold down her pain medicine due to vomiting. Presently complains of nausea. Feels like flareup of Crohn's disease that she's had in the past. Patient reports partial obstruction of bowel is scheduled for surgery 04/01/2011. Pain is worse with pressure not improved by anything sharp moderate to severe Past Medical History  Diagnosis Date  . IBS (irritable bowel syndrome)   . Ovarian cyst   . Diarrhea   . Arthritis     BACK  . Crohn's   . Family history of malignant neoplasm of gastrointestinal tract   . Skin cancer     Past Surgical History  Procedure Date  . Back surgery     L5-S1  . Wisdom tooth extraction   . Appendectomy 10/2008  . Spine surgery 08/2005    L5 - S1 fusion    Family History  Problem Relation Age of Onset  . Colon cancer Paternal Grandfather   . Cancer Paternal Grandfather     colon  . Breast cancer Paternal Grandmother   . Colon polyps Paternal Grandmother   . Cancer Paternal Grandmother     breast  . Diabetes Maternal Grandmother   . Heart disease Maternal Grandmother   . Cirrhosis Maternal Grandfather     heavy drinker    History  Substance Use Topics  . Smoking status: Former Smoker    Types: Cigarettes    Quit date: 04/28/2010  . Smokeless tobacco: Never Used  . Alcohol Use: No     rarely    OB History    Grav Para Term Preterm Abortions TAB SAB Ect Mult Living                  Review of Systems  Constitutional: Negative.   HENT:  Negative.   Respiratory: Negative.   Cardiovascular: Negative.   Gastrointestinal: Positive for nausea, vomiting and abdominal pain.  Musculoskeletal: Negative.   Skin: Negative.   Neurological: Negative.   Hematological: Negative.   Psychiatric/Behavioral: Negative.     Allergies  Percocet  Home Medications   Current Outpatient Rx  Name Route Sig Dispense Refill  . AMPHETAMINE-DEXTROAMPHETAMINE 30 MG PO CP24 Oral Take 1 capsule (30 mg total) by mouth every morning. 30 capsule 0  . ASACOL HD 800 MG PO TBEC  TAKE 2 TABLETS BY MOUTH TWICE DAILY 120 tablet 1  . BD INSULIN SYRINGE 25G X 1" 1 ML MISC      . CYANOCOBALAMIN 1000 MCG/ML IJ SOLN Intramuscular Inject 1 mL (1,000 mcg total) into the muscle once. 10 mL 0  . HYDROCODONE-ACETAMINOPHEN 5-325 MG PO TABS Oral Take 1 tablet by mouth every 4 (four) hours as needed for pain. 50 tablet 0  . NUVARING 0.12-0.015 MG/24HR VA RING      . PREDNISONE 10 MG PO TABS  Take 20 mg po daily x 2 weeks, then 15 mg po  daily x 2 weeks then 10 mg po daily x 2 weeks. 70 tablet 0  . PROMETHAZINE HCL 12.5 MG PO TABS  Take 1 tablet by mouth  every 4-6 hours as needed. (pharmacy-please d/c rx for zofran. Insurance will not cover) 20 tablet 0  . SYRINGE (DISPOSABLE) 3 ML MISC  Use syringe for B12 injections: 1000ug weekly x 4 then monthly 25 each 0    BP 124/82  Pulse 103  Temp(Src) 97.7 F (36.5 C) (Oral)  Resp 18  SpO2 100%  LMP 03/17/2011  Physical Exam  Nursing note and vitals reviewed. Constitutional: She appears well-developed and well-nourished.  HENT:  Head: Normocephalic and atraumatic.  Eyes: Conjunctivae are normal. Pupils are equal, round, and reactive to light.  Neck: Neck supple. No tracheal deviation present. No thyromegaly present.  Cardiovascular: Normal rate and regular rhythm.   No murmur heard. Pulmonary/Chest: Effort normal and breath sounds normal.  Abdominal: Soft. Bowel sounds are normal. She exhibits no distension. There  is tenderness.       Tender right lower quadrant  Musculoskeletal: Normal range of motion. She exhibits no edema and no tenderness.  Neurological: She is alert. Coordination normal.  Skin: Skin is warm and dry. No rash noted.  Psychiatric: She has a normal mood and affect.    ED Course  Procedures (including critical care time)   Labs Reviewed  I-STAT, CHEM 8  CBC  DIFFERENTIAL  URINALYSIS, ROUTINE W REFLEX MICROSCOPIC  POCT PREGNANCY, URINE   No results found.   No diagnosis found.  12:25 PM spoke with Dr. Juanda Chance plan suggests Solu-Medrol 20 mg intravenously pain and nausea control, symptomatic treatment rule out bowel obstruction CT scan  3:25 PM patient's pain is improved after treatment with narcotic pain medicine intravenous fluids. Still feels nauseated Phenergan ordered. 4:10 PM patient feels ready to go home 3:25 PM  MDM  Plan clear liquids for 24 hours, Dr.Brodie's office to call patient tomorrow for followup Diagnosis abdominal pain #2 nausea and vomiting #3 exacerbation of Crohn's disease        Doug Sou, MD 03/18/11 1622

## 2011-03-18 NOTE — ED Notes (Signed)
Pt c/o sudden onset of abd pain since this AM. Hx of Crohn's dx.  Pt states she has n/v. Scheduled for surgery on 12/5. Pt states she took a Phenergan PTA.

## 2011-03-18 NOTE — ED Notes (Signed)
Returned from CT.

## 2011-03-18 NOTE — Telephone Encounter (Signed)
Patient's mother calling to report patient is in a lot of pain in RLQ this AM. States her stomach feels like it is under pressure all across it. Every time she sits up she has nausea and vomiting. She can hardly get out of bed. Denies fever. This all started this AM. Patient was fine when she went to bed. She will take patient to ER to be evaluated. Dr. Juanda Chance aware.

## 2011-03-18 NOTE — Telephone Encounter (Signed)
Left a message for patient to call back. 

## 2011-03-25 ENCOUNTER — Telehealth: Payer: Self-pay | Admitting: *Deleted

## 2011-03-25 NOTE — Telephone Encounter (Signed)
Patient states that she is doing much better than on Tuesday, however, she is still having some left sided abdominal pain and aching joints. She states that this is tolerable at this time. Her surgery is scheduled for next Wednesday and she wonders if she should discontinue her steroids prior to surgery. I have advised her that her surgeon may be able to answer that question better since he will be operating on her. She verbalizes understanding and has been told that she may start full liquids if she feels she is able to.

## 2011-03-25 NOTE — Telephone Encounter (Signed)
Message copied by Richardson Chiquito on Tue Mar 25, 2011 10:00 AM ------      Message from: Richardson Chiquito      Created: Tue Mar 25, 2011  9:14 AM                   ----- Message -----         From: Vernia Buff, CMA         Sent: 03/25/2011   8:58 AM           To: Daphine Deutscher, RN            Left message for patient to call back.            ----- Message -----         From: Mike Gip, PA         Sent: 03/18/2011   3:37 PM           To: Daphine Deutscher, RN             Rene Kocher, pt was in the er today, and had a CT which does not show obstruction. She was sent home on clear liquids and analgesics, and is to continue her steroids. Please check on her tomorrow-she can advance to full liquids if doing ok.  Also need to be sure she has her surgery date  Scheduled. Thank you

## 2011-03-28 ENCOUNTER — Encounter (HOSPITAL_COMMUNITY): Payer: Self-pay

## 2011-03-31 ENCOUNTER — Encounter (HOSPITAL_COMMUNITY): Payer: Self-pay

## 2011-03-31 ENCOUNTER — Ambulatory Visit (HOSPITAL_COMMUNITY)
Admission: RE | Admit: 2011-03-31 | Discharge: 2011-03-31 | Disposition: A | Discharge status: Home / Self Care | Payer: 59 | Source: Ambulatory Visit | Attending: Surgery | Admitting: Surgery

## 2011-03-31 ENCOUNTER — Encounter (HOSPITAL_COMMUNITY)
Admission: RE | Admit: 2011-03-31 | Discharge: 2011-03-31 | Disposition: A | Discharge status: Home / Self Care | Payer: 59 | Source: Ambulatory Visit | Attending: Surgery | Admitting: Surgery

## 2011-03-31 DIAGNOSIS — Z01812 Encounter for preprocedural laboratory examination: Secondary | ICD-10-CM | POA: Insufficient documentation

## 2011-03-31 DIAGNOSIS — K5000 Crohn's disease of small intestine without complications: Secondary | ICD-10-CM | POA: Insufficient documentation

## 2011-03-31 DIAGNOSIS — Z01818 Encounter for other preprocedural examination: Secondary | ICD-10-CM | POA: Insufficient documentation

## 2011-03-31 DIAGNOSIS — R0602 Shortness of breath: Secondary | ICD-10-CM | POA: Insufficient documentation

## 2011-03-31 HISTORY — DX: Nausea with vomiting, unspecified: R11.2

## 2011-03-31 HISTORY — DX: Other specified postprocedural states: Z98.890

## 2011-03-31 HISTORY — DX: Gastro-esophageal reflux disease without esophagitis: K21.9

## 2011-03-31 LAB — BASIC METABOLIC PANEL
CO2: 23 mEq/L (ref 19–32)
Chloride: 104 mEq/L (ref 96–112)
Creatinine, Ser: 0.82 mg/dL (ref 0.50–1.10)
GFR calc Af Amer: >90 mL/min (ref >90)
Potassium: 3.8 mEq/L (ref 3.5–5.1)
Sodium: 138 mEq/L (ref 135–145)

## 2011-03-31 LAB — SURGICAL PCR SCREEN: MRSA, PCR: NEGATIVE

## 2011-03-31 LAB — CBC
MCV: 89.2 fL (ref 78.0–100.0)
Platelets: 231 10*3/uL (ref 150–400)
RBC: 4.45 MIL/uL (ref 3.87–5.11)
RDW: 11.8 % (ref 11.5–15.5)
WBC: 5.1 10*3/uL (ref 4.0–10.5)

## 2011-03-31 LAB — HCG, SERUM, QUALITATIVE: Preg, Serum: NEGATIVE

## 2011-03-31 NOTE — Patient Instructions (Addendum)
20 GENOLA YUILLE  03/31/2011   Your procedure is scheduled on:  04/02/11    Surgery [id number]  Report to [facility] [id number] AM.  Call this number if you have problems the morning of surgery: [phone]              PST  Azalya Galyon [id number]  Remember:   Do not eat food:After Midnight. Monday  May have clear liquids:beginning Tuesday AM. Increase fluids Tuesday. None after midnight Tuesday night. States has bowel prep instructions at home. Clear liquids include soda, tea, black coffee, apple or grape juice, broth.  Take these medicines the morning of surgery with A SIP OF WATER:NORCO, PHENERGRAN  Or Xanax with sip water if needed   Do not wear jewelry, make-up or nail polish.  Do not wear lotions, powders, or perfumes. You may wear deodorant.  Do not shave 48 hours prior to surgery.  Do not bring valuables to the hospital.  Contacts, dentures or bridgework may not be worn into surgery.  Leave suitcase in the car. After surgery it may be brought to your room.  For patients admitted to the hospital, checkout time is 11:00 AM the day of discharge.   Patients discharged the day of surgery will not be allowed to drive home.  Name and phone number of your driver: mom or friend  Special Instructions: CHG Shower Use Special Wash: 1/2 bottle night before surgery and 1/2 bottle morning of surgery.               Regular soap face and privates, no more shaving before surgery  Please read over the following fact sheets that you were given: MRSA Information

## 2011-03-31 NOTE — Pre-Procedure Instructions (Signed)
EKG report 7/12 on chart- denies any problems

## 2011-04-01 ENCOUNTER — Encounter (HOSPITAL_COMMUNITY): Payer: Self-pay | Admitting: *Deleted

## 2011-04-01 NOTE — Anesthesia Preprocedure Evaluation (Addendum)
Anesthesia Evaluation  Patient identified by MRN, date of birth, ID band Patient awake    Reviewed: Allergy & Precautions, H&P , NPO status , Patient's Chart, lab work & pertinent test results  History of Anesthesia Complications (+) PONV  Airway Mallampati: II TM Distance: >3 FB Neck ROM: full    Dental No notable dental hx. (+) Teeth Intact and Dental Advisory Given   Pulmonary neg pulmonary ROS,  clear to auscultation  Pulmonary exam normal       Cardiovascular Exercise Tolerance: Good neg cardio ROS regular Normal    Neuro/Psych PSYCHIATRIC DISORDERS Anxiety Depression Negative Neurological ROS     GI/Hepatic Neg liver ROS, GERD-  ,Crohns disease.  Chronic steroid use.   Endo/Other  Negative Endocrine ROS  Renal/GU negative Renal ROS  Genitourinary negative   Musculoskeletal   Abdominal   Peds  Hematology negative hematology ROS (+)   Anesthesia Other Findings   Reproductive/Obstetrics negative OB ROS                       Anesthesia Physical Anesthesia Plan  ASA: II  Anesthesia Plan: General   Post-op Pain Management:    Induction: Intravenous  Airway Management Planned: Oral ETT  Additional Equipment:   Intra-op Plan:   Post-operative Plan: Extubation in OR  Informed Consent: I have reviewed the patients History and Physical, chart, labs and discussed the procedure including the risks, benefits and alternatives for the proposed anesthesia with the patient or authorized representative who has indicated his/her understanding and acceptance.   Dental Advisory Given  Plan Discussed with: CRNA and Surgeon  Anesthesia Plan Comments:         Anesthesia Quick Evaluation

## 2011-04-01 NOTE — H&P (Addendum)
Norma Merino, MD   Subjective:   Patient ID: Norma Harmon, female DOB: 1985/07/13, 25 y.o. MRN: [id number]  HPI 25 year old white female with a history of RLQ abdominal pain going back to the seventh grade when she lost 40 lbs over a summer with similar pain. This has recurred and is very similar. She had a lap appendectomy 10/2008 by Zachery Dakins which showed acute appendicitis and not Crohn's. She has developed terminal ileitis and has been treated with some success but she still hurts and she wants to have this area resected.  Patient Active Problem List   Diagnoses   .  Crohn's disease   .  Chest pain, atypical   .  SOB (shortness of breath)   .  Back pain   .  Tobacco abuse   .  ADD (attention deficit disorder with hyperactivity)    Current Outpatient Prescriptions   Medication  Sig  Dispense  Refill   .  ASACOL HD 800 MG TBEC  TAKE 2 TABLETS BY MOUTH TWICE DAILY  120 tablet  0   .  B-D INSULIN SYRINGE 1CC/25GX1" 25G X 1" 1 ML MISC      .  cyanocobalamin (,VITAMIN B-12,) 1000 MCG/ML injection  Inject 1 mL (1,000 mcg total) into the muscle once.  10 mL  0   .  HYDROcodone-acetaminophen (NORCO) 5-325 MG per tablet  TAKE 1 TABLET BY MOUTH EVERY 4 HOURS AS NEEDED FOR PAIN  20 tablet  0   .  methylphenidate (RITALIN) 10 MG tablet  One p.o. B.i.d.  60 tablet  0   .  NUVARING 0.12-0.015 MG/24HR vaginal ring      .  promethazine (PHENERGAN) 12.5 MG tablet  Take 1 tablet by mouth every 4-6 hours as needed. (pharmacy-please d/c rx for zofran. Insurance will not cover)  20 tablet  0   .  Syringe, Disposable, 3 ML MISC  Use syringe for B12 injections: 1000ug weekly x 4 then monthly  25 each  0   .  varenicline (CHANTIX CONTINUING MONTH PAK) 1 MG tablet  Take 1 tablet (1 mg total) by mouth 2 (two) times daily.  15 tablet  3    Current Facility-Administered Medications   Medication  Dose  Route  Frequency  Provider  Last Rate  Last Dose   .  cyanocobalamin ((VITAMIN B-12)) injection 1,000  mcg  1,000 mcg  Intramuscular  Weekly  Hart Carwin, MD   1,000 mcg at 01/17/11 1008    Allergies   Allergen  Reactions   .  Percocet (Oxycodone-Acetaminophen)     Past Medical History   Diagnosis  Date   .  IBS (irritable bowel syndrome)    .  Ovarian cyst    .  Diarrhea    .  Arthritis      BACK   .  Crohn's    .  Family history of malignant neoplasm of gastrointestinal tract    .  Skin cancer     Past Surgical History   Procedure  Date   .  Back surgery      L5-S1   .  Wisdom tooth extraction    .  Appendectomy  10/2008   .  Spine surgery  08/2005     L5 - S1 fusion    Review of Systems  Constitutional: Positive for activity change and appetite change.  HENT: Negative.  Eyes: Negative.  Respiratory: Negative.  Cardiovascular: Negative.  Gastrointestinal: Positive for  vomiting and abdominal pain.  Genitourinary: Negative.  Musculoskeletal: Negative.  Skin: Negative.  Neurological: Negative.  Hematological: Negative.  Psychiatric/Behavioral: Negative.    Objective:   Physical Exam  Constitutional: She is oriented to person, place, and time. She appears well-developed and well-nourished.  HENT:  Head: Normocephalic and atraumatic.  Eyes: Conjunctivae are normal. Pupils are equal, round, and reactive to light.  Neck: Normal range of motion. Neck supple.  Cardiovascular: Normal rate, regular rhythm and normal heart sounds.  Pulmonary/Chest: Effort normal and breath sounds normal.  Abdominal: Soft. There is tenderness.  RLQ pain to palpation  Musculoskeletal: Normal range of motion.  Neurological: She is alert and oriented to person, place, and time.  Skin: Skin is warm and dry.  Psychiatric: She has a normal mood and affect. Her behavior is normal. Judgment and thought content normal.    Assessment:    Crohn's of terminal ileum. I have reviewed her SBFT.   I have discussed lap assisted ileo cecectomy with her and her mother including risk and benefits with  chance for recurrent disease of the small bowel   Plan:    Lap assisted right ileocecectomy   There has been no change in the patient's past medical history or physical exam in the past 24 hours to the best of my knowledge.  Expectations and outcome results have been discussed with the patient to include risks and benefits.  All questions have been answered and will proceed with previously discussed procedure noted and signed in the consent form in the patient's record.    Livier Hendel BMD @NOW  04/02/2011

## 2011-04-02 ENCOUNTER — Encounter (HOSPITAL_COMMUNITY): Payer: Self-pay | Admitting: *Deleted

## 2011-04-02 ENCOUNTER — Other Ambulatory Visit (INDEPENDENT_AMBULATORY_CARE_PROVIDER_SITE_OTHER): Payer: Self-pay | Admitting: Surgery

## 2011-04-02 ENCOUNTER — Encounter (HOSPITAL_COMMUNITY)
Admission: RE | Disposition: A | Discharge status: Home / Self Care | Payer: Self-pay | Source: Ambulatory Visit | Attending: Surgery

## 2011-04-02 ENCOUNTER — Encounter (HOSPITAL_COMMUNITY): Payer: Self-pay | Admitting: Surgery

## 2011-04-02 ENCOUNTER — Inpatient Hospital Stay (HOSPITAL_COMMUNITY): Payer: 59 | Admitting: Anesthesiology

## 2011-04-02 ENCOUNTER — Encounter (HOSPITAL_COMMUNITY): Payer: Self-pay | Admitting: Anesthesiology

## 2011-04-02 ENCOUNTER — Inpatient Hospital Stay (HOSPITAL_COMMUNITY)
Admission: RE | Admit: 2011-04-02 | Discharge: 2011-04-09 | DRG: 331 | Disposition: A | Discharge status: Home / Self Care | Payer: 59 | Source: Ambulatory Visit | Attending: Surgery | Admitting: Surgery

## 2011-04-02 DIAGNOSIS — R110 Nausea: Secondary | ICD-10-CM | POA: Diagnosis not present

## 2011-04-02 DIAGNOSIS — K5090 Crohn's disease, unspecified, without complications: Secondary | ICD-10-CM

## 2011-04-02 DIAGNOSIS — F17200 Nicotine dependence, unspecified, uncomplicated: Secondary | ICD-10-CM | POA: Diagnosis present

## 2011-04-02 DIAGNOSIS — K562 Volvulus: Principal | ICD-10-CM | POA: Diagnosis present

## 2011-04-02 DIAGNOSIS — M549 Dorsalgia, unspecified: Secondary | ICD-10-CM

## 2011-04-02 DIAGNOSIS — F909 Attention-deficit hyperactivity disorder, unspecified type: Secondary | ICD-10-CM

## 2011-04-02 DIAGNOSIS — K560 Paralytic ileus: Secondary | ICD-10-CM | POA: Diagnosis not present

## 2011-04-02 HISTORY — PX: COLON RESECTION: SHX5231

## 2011-04-02 LAB — CBC
HCT: 34.4 % — ABNORMAL LOW (ref 36.0–46.0)
MCV: 88.2 fL (ref 78.0–100.0)
Platelets: 170 10*3/uL (ref 150–400)
RBC: 3.9 MIL/uL (ref 3.87–5.11)
WBC: 10 10*3/uL (ref 4.0–10.5)

## 2011-04-02 LAB — CREATININE, SERUM: GFR calc Af Amer: >90 mL/min (ref >90)

## 2011-04-02 LAB — TYPE AND SCREEN: ABO/RH(D): A POS

## 2011-04-02 SURGERY — COLON RESECTION LAPAROSCOPIC
Anesthesia: General | Site: Abdomen | Wound class: Clean Contaminated

## 2011-04-02 MED ORDER — PROMETHAZINE HCL 25 MG/ML IJ SOLN
INTRAMUSCULAR | Status: AC
  Filled 2011-04-02: qty 1

## 2011-04-02 MED ORDER — SODIUM CHLORIDE 0.9 % IV SOLN
1.0000 g | INTRAVENOUS | Status: AC
  Administered 2011-04-02: 1 g via INTRAVENOUS
  Filled 2011-04-02: qty 1

## 2011-04-02 MED ORDER — FENTANYL CITRATE 0.05 MG/ML IJ SOLN
INTRAMUSCULAR | Status: DC | PRN
  Administered 2011-04-02 (×5): 100 ug via INTRAVENOUS
  Administered 2011-04-02 (×2): 50 ug via INTRAVENOUS

## 2011-04-02 MED ORDER — KETOROLAC TROMETHAMINE 15 MG/ML IJ SOLN
15.0000 mg | Freq: Four times a day (QID) | INTRAMUSCULAR | Status: AC
  Administered 2011-04-02 – 2011-04-03 (×2): 15 mg via INTRAVENOUS
  Filled 2011-04-02 (×2): qty 1

## 2011-04-02 MED ORDER — BUPIVACAINE LIPOSOME 1.3 % IJ SUSP
20.0000 mL | INTRAMUSCULAR | Status: AC
  Administered 2011-04-02: 20 mL
  Filled 2011-04-02: qty 20

## 2011-04-02 MED ORDER — HYDROMORPHONE HCL PF 1 MG/ML IJ SOLN
0.5000 mg | INTRAMUSCULAR | Status: DC | PRN
  Administered 2011-04-02: 0.5 mg via INTRAVENOUS
  Filled 2011-04-02: qty 1

## 2011-04-02 MED ORDER — SODIUM CHLORIDE 0.9 % IV SOLN
INTRAVENOUS | Status: AC
  Filled 2011-04-02: qty 50

## 2011-04-02 MED ORDER — HYDROMORPHONE HCL PF 2 MG/ML IJ SOLN
1.0000 mg | INTRAMUSCULAR | Status: DC | PRN
  Administered 2011-04-02: 1 mg via INTRAVENOUS
  Administered 2011-04-02: 0.5 mg via INTRAVENOUS
  Administered 2011-04-02 – 2011-04-09 (×30): 1 mg via INTRAVENOUS
  Filled 2011-04-02 (×31): qty 1

## 2011-04-02 MED ORDER — HYDROMORPHONE HCL PF 1 MG/ML IJ SOLN
INTRAMUSCULAR | Status: AC
  Administered 2011-04-02: 0.5 mg via INTRAVENOUS
  Filled 2011-04-02: qty 1

## 2011-04-02 MED ORDER — POTASSIUM CHLORIDE IN NACL 20-0.45 MEQ/L-% IV SOLN
INTRAVENOUS | Status: DC
  Administered 2011-04-02 – 2011-04-08 (×10): via INTRAVENOUS
  Filled 2011-04-02 (×14): qty 1000

## 2011-04-02 MED ORDER — ACETAMINOPHEN 10 MG/ML IV SOLN
INTRAVENOUS | Status: DC | PRN
  Administered 2011-04-02: 1000 mg via INTRAVENOUS

## 2011-04-02 MED ORDER — HYDROMORPHONE HCL PF 1 MG/ML IJ SOLN
0.2500 mg | INTRAMUSCULAR | Status: DC | PRN
  Administered 2011-04-02 (×3): 0.5 mg via INTRAVENOUS

## 2011-04-02 MED ORDER — LACTATED RINGERS IV SOLN: INTRAVENOUS | Status: DC

## 2011-04-02 MED ORDER — LACTATED RINGERS IV SOLN
INTRAVENOUS | Status: DC | PRN
  Administered 2011-04-02 (×3): via INTRAVENOUS

## 2011-04-02 MED ORDER — SCOPOLAMINE 1 MG/3DAYS TD PT72
MEDICATED_PATCH | TRANSDERMAL | Status: AC
  Filled 2011-04-02: qty 1

## 2011-04-02 MED ORDER — HEPARIN SODIUM (PORCINE) 5000 UNIT/ML IJ SOLN
5000.0000 [IU] | Freq: Three times a day (TID) | INTRAMUSCULAR | Status: DC
  Administered 2011-04-02 – 2011-04-09 (×21): 5000 [IU] via SUBCUTANEOUS
  Filled 2011-04-02 (×24): qty 1

## 2011-04-02 MED ORDER — DEXAMETHASONE SODIUM PHOSPHATE 10 MG/ML IJ SOLN
INTRAMUSCULAR | Status: DC | PRN
  Administered 2011-04-02: 10 mg via INTRAVENOUS

## 2011-04-02 MED ORDER — ONDANSETRON HCL 4 MG/2ML IJ SOLN
INTRAMUSCULAR | Status: DC | PRN
  Administered 2011-04-02: 4 mg via INTRAVENOUS

## 2011-04-02 MED ORDER — HEPARIN SODIUM (PORCINE) 5000 UNIT/ML IJ SOLN
5000.0000 [IU] | Freq: Once | INTRAMUSCULAR | Status: AC
  Administered 2011-04-02: 5000 [IU] via SUBCUTANEOUS

## 2011-04-02 MED ORDER — FENTANYL CITRATE 0.05 MG/ML IJ SOLN
INTRAMUSCULAR | Status: AC
  Filled 2011-04-02: qty 2

## 2011-04-02 MED ORDER — SCOPOLAMINE 1 MG/3DAYS TD PT72
MEDICATED_PATCH | TRANSDERMAL | Status: DC | PRN
  Administered 2011-04-02: 1 via TRANSDERMAL

## 2011-04-02 MED ORDER — SODIUM CHLORIDE 0.9 % IR SOLN
Status: DC | PRN
  Administered 2011-04-02: 1000 mL

## 2011-04-02 MED ORDER — ONDANSETRON HCL 4 MG/2ML IJ SOLN
4.0000 mg | Freq: Four times a day (QID) | INTRAMUSCULAR | Status: DC | PRN
  Administered 2011-04-02 – 2011-04-08 (×13): 4 mg via INTRAVENOUS
  Filled 2011-04-02 (×13): qty 2

## 2011-04-02 MED ORDER — PROPOFOL 10 MG/ML IV EMUL
INTRAVENOUS | Status: DC | PRN
  Administered 2011-04-02: 180 mg via INTRAVENOUS

## 2011-04-02 MED ORDER — DIPHENHYDRAMINE HCL 50 MG/ML IJ SOLN
25.0000 mg | Freq: Four times a day (QID) | INTRAMUSCULAR | Status: AC | PRN
  Administered 2011-04-02 – 2011-04-07 (×6): 25 mg via INTRAVENOUS
  Filled 2011-04-02 (×6): qty 1

## 2011-04-02 MED ORDER — ROCURONIUM BROMIDE 100 MG/10ML IV SOLN
INTRAVENOUS | Status: DC | PRN
  Administered 2011-04-02: 50 mg via INTRAVENOUS
  Administered 2011-04-02: 10 mg via INTRAVENOUS

## 2011-04-02 MED ORDER — NEOSTIGMINE METHYLSULFATE 1 MG/ML IJ SOLN
INTRAMUSCULAR | Status: DC | PRN
  Administered 2011-04-02: 3.5 mg via INTRAVENOUS

## 2011-04-02 MED ORDER — SUCCINYLCHOLINE CHLORIDE 20 MG/ML IJ SOLN
INTRAMUSCULAR | Status: DC | PRN
  Administered 2011-04-02: 100 mg via INTRAVENOUS

## 2011-04-02 MED ORDER — HYDROCODONE-ACETAMINOPHEN 5-325 MG PO TABS
1.0000 | ORAL_TABLET | ORAL | Status: DC | PRN
  Administered 2011-04-03 (×2): 1 via ORAL
  Administered 2011-04-03 – 2011-04-04 (×3): 2 via ORAL
  Administered 2011-04-04: 1 via ORAL
  Administered 2011-04-04 – 2011-04-05 (×5): 2 via ORAL
  Administered 2011-04-05: 1 via ORAL
  Administered 2011-04-06 – 2011-04-09 (×12): 2 via ORAL
  Filled 2011-04-02 (×2): qty 2
  Filled 2011-04-02: qty 1
  Filled 2011-04-02 (×5): qty 2
  Filled 2011-04-02: qty 1
  Filled 2011-04-02 (×13): qty 2
  Filled 2011-04-02: qty 1
  Filled 2011-04-02: qty 2

## 2011-04-02 MED ORDER — PROMETHAZINE HCL 25 MG/ML IJ SOLN
6.2500 mg | INTRAMUSCULAR | Status: DC | PRN
  Administered 2011-04-02: 6.25 mg via INTRAVENOUS

## 2011-04-02 MED ORDER — LIDOCAINE HCL (CARDIAC) 20 MG/ML IV SOLN
INTRAVENOUS | Status: DC | PRN
  Administered 2011-04-02: 50 mg via INTRAVENOUS

## 2011-04-02 MED ORDER — ACETAMINOPHEN 10 MG/ML IV SOLN
1000.0000 mg | Freq: Once | INTRAVENOUS | Status: AC
  Administered 2011-04-02: 1000 mg via INTRAVENOUS
  Filled 2011-04-02: qty 100

## 2011-04-02 MED ORDER — GLYCOPYRROLATE 0.2 MG/ML IJ SOLN
INTRAMUSCULAR | Status: DC | PRN
  Administered 2011-04-02: .6 mg via INTRAVENOUS

## 2011-04-02 MED ORDER — HYDROMORPHONE HCL PF 1 MG/ML IJ SOLN
INTRAMUSCULAR | Status: AC
  Filled 2011-04-02: qty 1

## 2011-04-02 MED ORDER — ONDANSETRON HCL 4 MG PO TABS
4.0000 mg | ORAL_TABLET | Freq: Four times a day (QID) | ORAL | Status: DC | PRN
  Administered 2011-04-04 – 2011-04-09 (×3): 4 mg via ORAL
  Filled 2011-04-02 (×3): qty 1

## 2011-04-02 MED ORDER — ACETAMINOPHEN 10 MG/ML IV SOLN
INTRAVENOUS | Status: AC
  Filled 2011-04-02: qty 100

## 2011-04-02 MED ORDER — HYDROMORPHONE HCL PF 1 MG/ML IJ SOLN
INTRAMUSCULAR | Status: DC | PRN
  Administered 2011-04-02 (×2): 1 mg via INTRAVENOUS

## 2011-04-02 SURGICAL SUPPLY — 83 items
APPLIER CLIP 5 13 M/L LIGAMAX5 (MISCELLANEOUS)
APPLIER CLIP ROT 10 11.4 M/L (STAPLE)
APR CLP MED LRG 11.4X10 (STAPLE)
BLADE EXTENDED COATED 6.5IN (ELECTRODE) ×2 IMPLANT
BLADE HEX COATED 2.75 (ELECTRODE) ×2 IMPLANT
BLADE SURG SZ10 CARB STEEL (BLADE) ×2 IMPLANT
CANISTER SUCTION 2500CC (MISCELLANEOUS) ×2 IMPLANT
CELLS DAT CNTRL 66122 CELL SVR (MISCELLANEOUS) ×1 IMPLANT
CLAMP ENDO BABCK 10MM (STAPLE) IMPLANT
CLIP APPLIE 5 13 M/L LIGAMAX5 (MISCELLANEOUS) IMPLANT
CLIP APPLIE ROT 10 11.4 M/L (STAPLE) IMPLANT
CLOTH BEACON ORANGE TIMEOUT ST (SAFETY) ×2 IMPLANT
CONNECTOR 5 IN 1 STRAIGHT STRL (MISCELLANEOUS) IMPLANT
COVER MAYO STAND STRL (DRAPES) ×2 IMPLANT
COVER SURGICAL LIGHT HANDLE (MISCELLANEOUS) IMPLANT
DECANTER SPIKE VIAL GLASS SM (MISCELLANEOUS) ×2 IMPLANT
DEVICE TROCAR PUNCTURE CLOSURE (ENDOMECHANICALS) IMPLANT
DRAPE LAPAROSCOPIC ABDOMINAL (DRAPES) ×2 IMPLANT
DRAPE LG THREE QUARTER DISP (DRAPES) ×2 IMPLANT
DRAPE WARM FLUID 44X44 (DRAPE) ×2 IMPLANT
ELECT REM PT RETURN 9FT ADLT (ELECTROSURGICAL) ×2
ELECTRODE REM PT RTRN 9FT ADLT (ELECTROSURGICAL) ×1 IMPLANT
GAUZE SPONGE 4X4 12PLY STRL LF (GAUZE/BANDAGES/DRESSINGS) ×2 IMPLANT
GLOVE BIOGEL M 8.0 STRL (GLOVE) ×4 IMPLANT
GLOVE BIOGEL PI IND STRL 7.0 (GLOVE) ×1 IMPLANT
GLOVE BIOGEL PI INDICATOR 7.0 (GLOVE) ×1
GOWN STRL NON-REIN LRG LVL3 (GOWN DISPOSABLE) ×4 IMPLANT
GOWN STRL REIN XL XLG (GOWN DISPOSABLE) ×4 IMPLANT
HAND ACTIVATED (MISCELLANEOUS) ×2 IMPLANT
KIT BASIN OR (CUSTOM PROCEDURE TRAY) ×2 IMPLANT
LEGGING LITHOTOMY PAIR STRL (DRAPES) IMPLANT
LIGASURE IMPACT 36 18CM CVD LR (INSTRUMENTS) IMPLANT
NS IRRIG 1000ML POUR BTL (IV SOLUTION) ×6 IMPLANT
PENCIL BUTTON HOLSTER BLD 10FT (ELECTRODE) ×2 IMPLANT
RELOAD PROXIMATE 75MM BLUE (ENDOMECHANICALS) ×4 IMPLANT
RTRCTR WOUND ALEXIS 18CM MED (MISCELLANEOUS) ×2
SCISSORS LAP 5X35 DISP (ENDOMECHANICALS) ×2 IMPLANT
SET IRRIG TUBING LAPAROSCOPIC (IRRIGATION / IRRIGATOR) ×2 IMPLANT
SHEET LAVH (DRAPES) IMPLANT
SLEEVE Z-THREAD 5X100MM (TROCAR) ×2 IMPLANT
SOLUTION ANTI FOG 6CC (MISCELLANEOUS) ×2 IMPLANT
SPONGE GAUZE 4X4 12PLY (GAUZE/BANDAGES/DRESSINGS) ×2 IMPLANT
SPONGE LAP 18X18 X RAY DECT (DISPOSABLE) ×4 IMPLANT
STAPLER PROXIMATE 75MM BLUE (STAPLE) ×2 IMPLANT
STAPLER VISISTAT 35W (STAPLE) ×2 IMPLANT
STRIP CLOSURE SKIN 1/2X4 (GAUZE/BANDAGES/DRESSINGS) IMPLANT
STRIP CLOSURE SKIN 1/8X3 (GAUZE/BANDAGES/DRESSINGS) ×2 IMPLANT
SUCTION POOLE TIP (SUCTIONS) ×2 IMPLANT
SUT PDS AB 0 CTX 60 (SUTURE) ×2 IMPLANT
SUT PDS AB 1 CT1 27 (SUTURE) IMPLANT
SUT PDS AB 1 CTX 36 (SUTURE) IMPLANT
SUT PDS AB 4-0 SH 27 (SUTURE) ×4 IMPLANT
SUT PROLENE 2 0 KS (SUTURE) IMPLANT
SUT SILK 2 0 (SUTURE) ×1
SUT SILK 2 0 SH CR/8 (SUTURE) ×4 IMPLANT
SUT SILK 2-0 18XBRD TIE 12 (SUTURE) ×1 IMPLANT
SUT SILK 3 0 (SUTURE) ×1
SUT SILK 3 0 SH CR/8 (SUTURE) ×2 IMPLANT
SUT SILK 3-0 18XBRD TIE 12 (SUTURE) ×1 IMPLANT
SUT VIC AB 2-0 CT1 27 (SUTURE)
SUT VIC AB 2-0 CT1 27XBRD (SUTURE) IMPLANT
SUT VIC AB 3-0 PS2 18 (SUTURE)
SUT VIC AB 3-0 PS2 18XBRD (SUTURE) IMPLANT
SUT VIC AB 4-0 SH 18 (SUTURE) IMPLANT
SUT VICRYL 0 UR6 27IN ABS (SUTURE) IMPLANT
SYR 30ML LL (SYRINGE) ×2 IMPLANT
SYR BULB IRRIGATION 50ML (SYRINGE) ×2 IMPLANT
SYS LAPSCP GELPORT 120MM (MISCELLANEOUS)
SYSTEM LAPSCP GELPORT 120MM (MISCELLANEOUS) IMPLANT
TAPE CLOTH SURG 4X10 WHT LF (GAUZE/BANDAGES/DRESSINGS) ×2 IMPLANT
TOWEL OR 17X26 10 PK STRL BLUE (TOWEL DISPOSABLE) IMPLANT
TRAY FOLEY CATH 14FRSI W/METER (CATHETERS) ×2 IMPLANT
TRAY LAP CHOLE (CUSTOM PROCEDURE TRAY) ×2 IMPLANT
TROCAR BLADELESS OPT 5 100 (ENDOMECHANICALS) IMPLANT
TROCAR BLADELESS OPT 5 75 (ENDOMECHANICALS) IMPLANT
TROCAR HASSON GELL 12X100 (TROCAR) IMPLANT
TROCAR Z-THREAD FIOS 11X100 BL (TROCAR) IMPLANT
TROCAR Z-THREAD FIOS 5X100MM (TROCAR) ×2 IMPLANT
TROCAR Z-THREAD SLEEVE 11X100 (TROCAR) ×2 IMPLANT
TUBING CONNECTING 10 (TUBING) IMPLANT
TUBING FILTER THERMOFLATOR (ELECTROSURGICAL) ×2 IMPLANT
YANKAUER SUCT BULB TIP 10FT TU (MISCELLANEOUS) ×2 IMPLANT
YANKAUER SUCT BULB TIP NO VENT (SUCTIONS) ×2 IMPLANT

## 2011-04-02 NOTE — Progress Notes (Signed)
Pt did bowel prep with good results

## 2011-04-02 NOTE — Progress Notes (Signed)
Pt admitted from [facility], moved from stretcher to bed under own power.  C/o pain and nausea. VS stable.  Family at bedside.  Will medicate and continue to monitor.

## 2011-04-02 NOTE — Preoperative (Signed)
Beta Blockers   Reason not to administer Beta Blockers:Not Applicable 

## 2011-04-02 NOTE — Op Note (Signed)
Surgeon: Wenda Low, MD, FACS  Asst:  Harriette Bouillon, MD, FACS  Anes:  General by Ms. Judeth Cornfield [place]  Procedure: Laparoscopy assisted right colectomy   Diagnosis: RLQ pain, ileal stricture and presumed Crohn's disease.  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 She understood the indications and risks of the procedure. She was taken back to the OR #11 and given general anesthesia. The abdomen was prepped with PCMX and draped sterilely. A timeout was performed. Abdomen was entered through the left upper quadrant using a 0 5 mm Optiview technique. The abdomen was insufflated and 2 additional 5 mm ports were placed one in the side of the eventual right transverse incision and the other in the lower midline. The cecum was noted to be long and down in the pelvis and completely mobile where it could be twisting easily on itself. I identified the appendiceal stump where Dr. Zachery Dakins had previously removed her appendix laparoscopically. There were no adhesions to this everything was well healed. Identified her terminal ileum and with the laparoscopic instruments I examined her intestine all the way back to her ligament of Treitz. There was no creeping fat, no small bowel thickening or evidence of Crohn's disease. I had previously reviewed her upper GI which showed the terminal ileal stricture which at that point I felt was likely due to the likely volvulus of the cecum. I discussed this case with Dr. Dickie La by telephone.  I resected the terminal ileum and the descending colon did this through a 8 cm transverse incision to the right of the midline. The bowel was easily delivered outside the body with the hand assist wound protector in place. The terminal ileum was stapled with a GIA as was the descending colon near the hepatic flexure.  Dermal mesentery was resected and we ligated the mesentery with 2-0 silk's as we went across. Hemostasis was maintained. A side-to-side ileo-colostomy was created with the GIA and the common defect was closed in 2 layers with 4-0 PDS and 3-0 silk. Mesentery was approximated with 2-0 silk area the bowel was returned to the abdomen. Gloves were changed. There was essentially no spillage.  The posterior sheath was closed with running 0 PDS and Exparel was injected into the subfascial layer. Wounds were irrigated and closed with 4-0 Vicryl with benzoin and Steri-Strips. Patient tolerated procedure well and was taken to the recovery room in satisfactory condition.   Matt B. Daphine Deutscher, MD, [facility], [place] [phone]

## 2011-04-02 NOTE — Transfer of Care (Signed)
Immediate Anesthesia Transfer of Care Note  Patient: Norma Harmon  Procedure(s) Performed:  COLON RESECTION LAPAROSCOPIC - Laparscopic  Assisted Ileocecostomy   Patient Location: PACU  Anesthesia Type: General  Level of Consciousness: awake and oriented  Airway & Oxygen Therapy: Patient Spontanous Breathing and Patient connected to face mask oxygen  Post-op Assessment: Report given to PACU RN, Post -op Vital signs reviewed and stable and Patient moving all extremities X 4  Post vital signs: Reviewed and stable  Complications: No apparent anesthesia complications

## 2011-04-02 NOTE — Progress Notes (Signed)
Pt c/o itching.  Paged Dr.Martin.  Awaiting return call.

## 2011-04-02 NOTE — Progress Notes (Signed)
Pt c/o itching.  Paged Dr.Martin again.  Awaiting return call.

## 2011-04-02 NOTE — Anesthesia Procedure Notes (Addendum)
Procedure Name: Intubation Date/Time: 04/02/2011 8:36 AM Performed by: [place], Chester Romero C Pre-anesthesia Checklist: Patient identified, Emergency Drugs available and Suction available Patient Re-evaluated:Patient Re-evaluated prior to inductionOxygen Delivery Method: Circle System Utilized Preoxygenation: Pre-oxygenation with 100% oxygen Intubation Type: IV induction Ventilation: Mask ventilation without difficulty Laryngoscope Size: Mac and 3 Tube type: Oral Tube size: 7.5 mm Number of attempts: 1 Airway Equipment and Method: stylet Placement Confirmation: ETT inserted through vocal cords under direct vision,  positive ETCO2,  CO2 detector and breath sounds checked- equal and bilateral Secured at: 21 cm Tube secured with: Tape Dental Injury: Teeth and Oropharynx as per pre-operative assessment

## 2011-04-02 NOTE — Progress Notes (Signed)
Foley catheter removed without difficulty- 125 cc of yellow-colored urine in bag.

## 2011-04-02 NOTE — Anesthesia Postprocedure Evaluation (Signed)
  Anesthesia Post-op Note  Patient: Norma Harmon  Procedure(s) Performed:  COLON RESECTION LAPAROSCOPIC - Laparscopic  Assisted Ileocecostomy   Patient Location: PACU  Anesthesia Type: General  Level of Consciousness: awake and alert   Airway and Oxygen Therapy: Patient Spontanous Breathing  Post-op Pain: mild  Post-op Assessment: Post-op Vital signs reviewed, Patient's Cardiovascular Status Stable, Respiratory Function Stable, Patent Airway and No signs of Nausea or vomiting  Post-op Vital Signs: stable  Complications: No apparent anesthesia complications

## 2011-04-03 MED ORDER — PROMETHAZINE HCL 25 MG/ML IJ SOLN
12.5000 mg | Freq: Four times a day (QID) | INTRAMUSCULAR | Status: DC | PRN
  Administered 2011-04-03 – 2011-04-07 (×8): 12.5 mg via INTRAVENOUS
  Administered 2011-04-08 (×2): 25 mg via INTRAVENOUS
  Filled 2011-04-03 (×10): qty 1

## 2011-04-03 MED ORDER — METHYLPREDNISOLONE SODIUM SUCC 40 MG IJ SOLR
40.0000 mg | Freq: Two times a day (BID) | INTRAMUSCULAR | Status: DC
  Administered 2011-04-03 – 2011-04-07 (×10): 40 mg via INTRAVENOUS
  Filled 2011-04-03 (×12): qty 1

## 2011-04-03 NOTE — Progress Notes (Signed)
Patient ID: Norma Harmon, female   DOB: 1985/05/11, 25 y.o.   MRN: [id number] [facility] Progress Note:   1 Day Post-Op  Subjective: Pain better controlled this am.  No new complaints Objective: Vital signs in last 24 hours: Temp:  [97.1 F (36.2 C)-98.7 F (37.1 C)] 98.4 F (36.9 C) (12/06 0600) Pulse Rate:  [66-110] 75  (12/06 0600) Resp:  [8-20] 16  (12/06 0600) BP: (96-145)/(57-88) 103/60 mmHg (12/06 0600) SpO2:  [96 %-100 %] 97 % (12/06 0600) Weight:  [115 lb (52.164 kg)] 115 lb (52.164 kg) (12/05 1433)  Intake/Output from previous day: 12/05 0701 - 12/06 0700 In: 4381 [I.V.:4381] Out: 2826 [Urine:2826] Intake/Output this shift:    Physical Exam:  Incisions OK Lab Results:   [facility] 04/02/11 1405 03/31/11 0935  WBC 10.0 5.1  HGB 11.8* 13.5  HCT 34.4* 39.7  PLT 170 231   BMET  Basename 04/02/11 1405 03/31/11 0935  NA -- 138  K -- 3.8  CL -- 104  CO2 -- 23  GLUCOSE -- 83  BUN -- 8  CREATININE 0.77 0.82  CALCIUM -- 10.1   PT/INR No results found for this basename: LABPROT:2,INR:2 in the last 72 hours Studies/Results: No results found. Anti-infectives: Anti-infectives     Start     Dose/Rate Route Frequency Ordered Stop   04/02/11 0645   ertapenem (INVANZ) 1 g in sodium chloride 0.9 % 50 mL IVPB        1 g 100 mL/hr over 30 Minutes Intravenous 60 min pre-op 04/02/11 4098 04/02/11 1191          Assessment/Plan: Problem List: Patient Active Problem List  Diagnoses  . Crohn's disease  . Back pain  . ADD (attention deficit disorder with hyperactivity)    Post op right  Colectomy.  No flatus.  Will add solumedrol because of history of recent steroid administration 1 Day Post-Op    LOS: 1 day   Matt B. Daphine Deutscher, MD, [facility] [phone] beeper [phone]  04/03/2011 8:37 AM

## 2011-04-03 NOTE — Progress Notes (Signed)
Pt verbalizes that she understands how to correctly use Incentive Spirometer; pt states that she has no questions regarding its correct use. Marcelino Duster, RN

## 2011-04-04 ENCOUNTER — Encounter (HOSPITAL_COMMUNITY): Payer: Self-pay | Admitting: Surgery

## 2011-04-04 NOTE — Progress Notes (Signed)
Patient ID: Norma Harmon, female   DOB: 1986/01/27, 25 y.o.   MRN: [id number] [facility] Progress Note:   2 Days Post-Op  Subjective: Sitting up and moving around more.  Less discomfort.  Some flatus Objective: Vital signs in last 24 hours: Temp:  [97.9 F (36.6 C)-98.3 F (36.8 C)] 98.3 F (36.8 C) (12/06 2200) Pulse Rate:  [56-74] 70  (12/06 2200) Resp:  [18] 18  (12/06 2200) BP: (104-113)/(64-70) 107/64 mmHg (12/06 2200) SpO2:  [97 %-100 %] 100 % (12/06 2200)  Intake/Output from previous day: 12/06 0701 - 12/07 0700 In: 2089.7 [I.V.:2089.7] Out: 3275 [Urine:3275] Intake/Output this shift:    Physical Exam:  nontender Lab Results:   Basename 04/02/11 1405  WBC 10.0  HGB 11.8*  HCT 34.4*  PLT 170   BMET  Basename 04/02/11 1405  NA --  K --  CL --  CO2 --  GLUCOSE --  BUN --  CREATININE 0.77  CALCIUM --   PT/INR No results found for this basename: LABPROT:2,INR:2 in the last 72 hours Studies/Results: No results found. Anti-infectives: Anti-infectives     Start     Dose/Rate Route Frequency Ordered Stop   04/02/11 0645   ertapenem (INVANZ) 1 g in sodium chloride 0.9 % 50 mL IVPB        1 g 100 mL/hr over 30 Minutes Intravenous 60 min pre-op 04/02/11 6578 04/02/11 4696          Assessment/Plan: Problem List: Patient Active Problem List  Diagnoses  . Crohn's disease  . Back pain  . ADD (attention deficit disorder with hyperactivity)    Path pending.  Covered with steroids short term.  Advance to clear liquid diet 2 Days Post-Op    LOS: 2 days   Matt B. Daphine Deutscher, MD, [facility] [phone] beeper [phone]  04/04/2011 7:56 AM

## 2011-04-05 LAB — CBC
HCT: 34.1 % — ABNORMAL LOW (ref 36.0–46.0)
RDW: 11.7 % (ref 11.5–15.5)
WBC: 6.5 10*3/uL (ref 4.0–10.5)

## 2011-04-05 LAB — DIFFERENTIAL
Basophils Absolute: 0 10*3/uL (ref 0.0–0.1)
Lymphocytes Relative: 13 % (ref 12–46)
Monocytes Absolute: 0.3 10*3/uL (ref 0.1–1.0)
Neutro Abs: 5.3 10*3/uL (ref 1.7–7.7)

## 2011-04-05 NOTE — Progress Notes (Signed)
Patient ID: Norma Harmon, female   DOB: May 26, 1985, 25 y.o.   MRN: [id number] [facility] Progress Note:   3 Days Post-Op  Subjective: Had some nausea this morning with chicken broth.  Good flatus since last night.   Objective: Vital signs in last 24 hours: Temp:  [98.2 F (36.8 C)-98.8 F (37.1 C)] 98.8 F (37.1 C) (12/08 0600) Pulse Rate:  [60-65] 60  (12/08 0600) Resp:  [18] 18  (12/08 0600) BP: (111-124)/(67-77) 114/67 mmHg (12/08 0600) SpO2:  [97 %-100 %] 100 % (12/08 0600)  Intake/Output from previous day: 12/07 0701 - 12/08 0700 In: 3422.4 [P.O.:1040; I.V.:2382.4] Out: 1550 [Urine:1550] Intake/Output this shift:    Physical Exam:  Incisions clean.  Abdomen flat Lab Results:   [facility] 04/02/11 1405  WBC 10.0  HGB 11.8*  HCT 34.4*  PLT 170   BMET  Basename 04/02/11 1405  NA --  K --  CL --  CO2 --  GLUCOSE --  BUN --  CREATININE 0.77  CALCIUM --   PT/INR No results found for this basename: LABPROT:2,INR:2 in the last 72 hours Studies/Results: No results found. Anti-infectives: Anti-infectives     Start     Dose/Rate Route Frequency Ordered Stop   04/02/11 0645   ertapenem (INVANZ) 1 g in sodium chloride 0.9 % 50 mL IVPB        1 g 100 mL/hr over 30 Minutes Intravenous 60 min pre-op 04/02/11 5621 04/02/11 3086          Assessment/Plan: Problem List: Patient Active Problem List  Diagnoses  . Crohn's disease  . Back pain  . ADD (attention deficit disorder with hyperactivity)    Will decrease IV rate to 50 and allow to continue clears today.  May advance tomorrow with hopeful discharge Monday.   3 Days Post-Op    LOS: 3 days   Matt B. Daphine Deutscher, MD, [facility] [phone] beeper [phone]  04/05/2011 8:08 AM

## 2011-04-06 LAB — CBC
HCT: 36.1 % (ref 36.0–46.0)
Hemoglobin: 12.1 g/dL (ref 12.0–15.0)
MCHC: 33.5 g/dL (ref 30.0–36.0)
RBC: 4 MIL/uL (ref 3.87–5.11)
WBC: 5.8 10*3/uL (ref 4.0–10.5)

## 2011-04-06 NOTE — Progress Notes (Signed)
Patient ID: Norma Harmon, female   DOB: [birth year], 25 y.o.   MRN: [id number] 4 Days Post-Op  Subjective: Some incisional pain when ambulating but not severe. She is drinking some clear liquids but having nausea without vomiting. She has had loose bowel movements. Up walking in the hall.  Objective: Vital signs in last 24 hours: Temp:  [98.9 F (37.2 C)-99.1 F (37.3 C)] 98.9 F (37.2 C) (12/09 0607) Pulse Rate:  [56-79] 62  (12/09 0607) Resp:  [16-18] 18  (12/09 0607) BP: (117-126)/(69-78) 117/78 mmHg (12/09 0607) SpO2:  [97 %] 97 % (12/09 0607) Last BM Date: 04/05/11  Intake/Output from previous day: 12/08 0701 - 12/09 0700 In: 2305.8 [P.O.:960; I.V.:1345.8] Out: -  Intake/Output this shift:    General appearance: alert and no distress GI: normal findings: soft, non-tender Incision/Wound:clean and dry without evidence of infection  Lab Results:   Basename 04/06/11 0435 04/05/11 1040  WBC 5.8 6.5  HGB 12.1 11.6*  HCT 36.1 34.1*  PLT 213 196   BMET No results found for this basename: NA:2,K:2,CL:2,CO2:2,GLUCOSE:2,BUN:2,CREATININE:2,CALCIUM:2 in the last 72 hours PT/INR No results found for this basename: LABPROT:2,INR:2 in the last 72 hours ABG No results found for this basename: PHART:2,PCO2:2,PO2:2,HCO3:2 in the last 72 hours  Studies/Results: No results found.  Anti-infectives: Anti-infectives     Start     Dose/Rate Route Frequency Ordered Stop   04/02/11 0645   ertapenem (INVANZ) 1 g in sodium chloride 0.9 % 50 mL IVPB        1 g 100 mL/hr over 30 Minutes Intravenous 60 min pre-op 04/02/11 4098 04/02/11 0838          Assessment/Plan: s/p Procedure(s): COLON RESECTION LAPAROSCOPIC Doing well except for some persistent nausea. Likely resolving ileus. The patient does not like clear liquids and would like to try a little full liquids. Will order this and observe today.   LOS: 4 days    Noel Rodier T 04/06/2011

## 2011-04-07 MED ORDER — ENSURE CLINICAL ST REVIGOR PO LIQD
237.0000 mL | Freq: Three times a day (TID) | ORAL | Status: DC
  Administered 2011-04-07 – 2011-04-09 (×3): 237 mL via ORAL

## 2011-04-07 NOTE — Progress Notes (Signed)
Patient ID: Norma Harmon, female   DOB: 04-12-1986, 25 y.o.   MRN: [id number] [facility] Progress Note:   5 Days Post-Op  Subjective: Got nauseated with the grits.  Not sure if it is pain meds or physiology.  However, will advance diet Objective: Vital signs in last 24 hours: Temp:  [97 F (36.1 C)-98.3 F (36.8 C)] 98.3 F (36.8 C) (12/10 0644) Pulse Rate:  [58-79] 58  (12/10 0644) Resp:  [18] 18  (12/10 0644) BP: (120-131)/(76-84) 131/84 mmHg (12/10 0644) SpO2:  [97 %-98 %] 98 % (12/10 0644)  Intake/Output from previous day: 12/09 0701 - 12/10 0700 In: 840.8 [P.O.:265; I.V.:575.8] Out: -  Intake/Output this shift:    Physical Exam:  Soft, no pain except incisional Lab Results:   Basename 04/06/11 0435 04/05/11 1040  WBC 5.8 6.5  HGB 12.1 11.6*  HCT 36.1 34.1*  PLT 213 196   BMET No results found for this basename: NA:2,K:2,CL:2,CO2:2,GLUCOSE:2,BUN:2,CREATININE:2,CALCIUM:2 in the last 72 hours PT/INR No results found for this basename: LABPROT:2,INR:2 in the last 72 hours Studies/Results: No results found. Anti-infectives: Anti-infectives     Start     Dose/Rate Route Frequency Ordered Stop   04/02/11 0645   ertapenem (INVANZ) 1 g in sodium chloride 0.9 % 50 mL IVPB        1 g 100 mL/hr over 30 Minutes Intravenous 60 min pre-op 04/02/11 0865 04/02/11 7846          Assessment/Plan: Problem List: Patient Active Problem List  Diagnoses  . Crohn's disease  . Back pain  . ADD (attention deficit disorder with hyperactivity)    Will try to advance diet.  Not quite ready for discharge 5 Days Post-Op    LOS: 5 days   Matt B. Daphine Deutscher, MD, [facility] [phone] beeper [phone]  04/07/2011 7:25 AM

## 2011-04-07 NOTE — Progress Notes (Signed)
Attempted to flush iv in right anterior forearm, pt stated flush caused excruciating pain and requested iv be pulled out and restarted by IV team, IV team notified of need,

## 2011-04-08 MED ORDER — DIPHENHYDRAMINE HCL 50 MG/ML IJ SOLN
25.0000 mg | INTRAMUSCULAR | Status: DC | PRN
  Administered 2011-04-08 – 2011-04-09 (×3): 25 mg via INTRAVENOUS
  Filled 2011-04-08 (×3): qty 1

## 2011-04-08 MED ORDER — METHYLPREDNISOLONE SODIUM SUCC 40 MG IJ SOLR
40.0000 mg | INTRAMUSCULAR | Status: DC
  Administered 2011-04-08: 40 mg via INTRAVENOUS
  Filled 2011-04-08 (×2): qty 1

## 2011-04-08 NOTE — Progress Notes (Signed)
Patient ID: AUBRIEGH MINCH, female   DOB: [birth year], 25 y.o.   MRN: [id number] [facility] Progress Note:   6 Days Post-Op  Subjective: Nausea better.  Wants to try to eat more before going home Objective: Vital signs in last 24 hours: Temp:  [98.2 F (36.8 C)-98.6 F (37 C)] 98.2 F (36.8 C) (12/11 0625) Pulse Rate:  [60-77] 76  (12/11 0625) Resp:  [18] 18  (12/11 0625) BP: (121-131)/(83-84) 124/84 mmHg (12/11 0625) SpO2:  [96 %-98 %] 98 % (12/11 0625)  Intake/Output from previous day: 12/10 0701 - 12/11 0700 In: 2890.3 [P.O.:1060; I.V.:1808.3; IV Piggyback:22] Out: -  Intake/Output this shift:    Physical Exam:  No abdominal pain.   Lab Results:   Basename 04/06/11 0435 04/05/11 1040  WBC 5.8 6.5  HGB 12.1 11.6*  HCT 36.1 34.1*  PLT 213 196   BMET No results found for this basename: NA:2,K:2,CL:2,CO2:2,GLUCOSE:2,BUN:2,CREATININE:2,CALCIUM:2 in the last 72 hours PT/INR No results found for this basename: LABPROT:2,INR:2 in the last 72 hours Studies/Results: No results found. Anti-infectives: Anti-infectives     Start     Dose/Rate Route Frequency Ordered Stop   04/02/11 0645   ertapenem (INVANZ) 1 g in sodium chloride 0.9 % 50 mL IVPB        1 g 100 mL/hr over 30 Minutes Intravenous 60 min pre-op 04/02/11 4098 04/02/11 1191          Assessment/Plan: Problem List: Patient Active Problem List  Diagnoses  . Crohn's disease  . Back pain  . ADD (attention deficit disorder with hyperactivity)    Improvement.  Hopeful discharge tomorrow 6 Days Post-Op    LOS: 6 days   Matt B. Daphine Deutscher, MD, [facility] [phone] beeper [phone]  04/08/2011 7:18 AM

## 2011-04-09 MED ORDER — ONDANSETRON HCL 4 MG PO TABS: 4.0000 mg | ORAL_TABLET | Freq: Four times a day (QID) | ORAL | Status: DC | PRN

## 2011-04-09 NOTE — Discharge Summary (Signed)
Physician Discharge Summary  Patient ID: Norma Harmon MRN: [id number] DOB/AGE: [birth year] 25 y.o.  Admit date: 04/02/2011 Discharge date: 04/09/2011  Admission Diagnoses:  Discharge Diagnoses:  Active Problems:  * No active hospital problems. *    Discharged Condition: good  Hospital Course: Did well after right hemicolectomy for intermittent cecal volvulus  Consults: none  Significant Diagnostic Studies: path showed no Crohn's  Treatments: right hemicolectomy  Discharge Exam: Blood pressure 135/78, pulse 81, temperature 98.7 F (37.1 C), temperature source Oral, resp. rate 18, height 5\' 5"  (1.651 m), weight 115 lb (52.164 kg), SpO2 99.00%. Incision/Wound: healing ok  Disposition: Home or Self Care  Discharge Orders    Future Appointments: Provider: Department: Dept Phone: Center:   04/25/2011 2:10 PM [name], MD [facility] [phone] None     Future Orders Please Complete By Expires   Discharge patient        Current Discharge Medication List    START taking these medications   Details  ondansetron (ZOFRAN) 4 MG tablet Take 1 tablet (4 mg total) by mouth every 6 (six) hours as needed for nausea. Qty: 20 tablet, Refills: 1      CONTINUE these medications which have NOT CHANGED   Details  amphetamine-dextroamphetamine (ADDERALL XR, 30MG ,) 30 MG 24 hr capsule Take 1 capsule (30 mg total) by mouth every morning. Qty: 30 capsule, Refills: 0   Associated Diagnoses: ADD (attention deficit disorder with hyperactivity)    BIOTIN 5000 PO Take 1 tablet by mouth daily.     HYDROcodone-acetaminophen (NORCO) 5-325 MG per tablet Take 1 tablet by mouth every 4 (four) hours as needed. pain    Multiple Vitamins-Minerals (CENTRUM ULTRA WOMENS PO) Take 1 tablet by mouth 2 (two) times daily after a meal.     multivitamin (THERAGRAN) per tablet Take 1 tablet by mouth daily.      NUVARING 0.12-0.015 MG/24HR vaginal ring Place 1 each vaginally every 28  (twenty-eight) days. Nuvaring in place at PST appt- states due to change 04/06/11    promethazine (PHENERGAN) 12.5 MG tablet Take 12.5 mg by mouth every 6 (six) hours as needed. Take 1 tablet by mouth every 4-6 hours as needed. (pharmacy-please d/c rx for zofran. Insurance will not cover)    ALPRAZolam (XANAX) 0.25 MG tablet Take 0.25 mg by mouth at bedtime as needed.     predniSONE (DELTASONE) 10 MG tablet Take 10 mg by mouth every morning.       STOP taking these medications     ASACOL HD 800 MG TBEC        Follow-up Information    Follow up with Pistol Kessenich B, MD in 3 weeks.   Contact information:   [employer] [place] [address], [address] [place] [address] [phone]          Signed: [name] 04/09/2011, 11:20 AM

## 2011-04-09 NOTE — Progress Notes (Signed)
Patient given discharge instructions, verbalized understanding. Iv site removed, catheter tip intact. Left unit in wheelchair accompanied by staff and mother.

## 2011-04-09 NOTE — Progress Notes (Signed)
Patient ID: Norma Harmon, female   DOB: 1986/04/16, 25 y.o.   MRN: [id number] [facility] Progress Note:   7 Days Post-Op  Subjective: Feeling better and eating better.  Ready for discharge Objective: Vital signs in last 24 hours: Temp:  [98.3 F (36.8 C)-98.7 F (37.1 C)] 98.7 F (37.1 C) (12/12 0600) Pulse Rate:  [81-88] 81  (12/12 0600) Resp:  [18] 18  (12/12 0600) BP: (120-135)/(78-85) 135/78 mmHg (12/12 0600) SpO2:  [95 %-99 %] 99 % (12/12 0600)  Intake/Output from previous day: 12/11 0701 - 12/12 0700 In: 1955 [P.O.:700; I.V.:1255] Out: 800 [Urine:800] Intake/Output this shift:    Physical Exam:  No abdominal pain issues Lab Results:  No results found for this basename: WBC:2,HGB:2,HCT:2,PLT:2 in the last 72 hours BMET No results found for this basename: NA:2,K:2,CL:2,CO2:2,GLUCOSE:2,BUN:2,CREATININE:2,CALCIUM:2 in the last 72 hours PT/INR No results found for this basename: LABPROT:2,INR:2 in the last 72 hours Studies/Results: No results found. Anti-infectives: Anti-infectives     Start     Dose/Rate Route Frequency Ordered Stop   04/02/11 0645   ertapenem (INVANZ) 1 g in sodium chloride 0.9 % 50 mL IVPB        1 g 100 mL/hr over 30 Minutes Intravenous 60 min pre-op 04/02/11 4098 04/02/11 1191          Assessment/Plan: Problem List: Patient Active Problem List  Diagnoses  . Back pain  . ADD (attention deficit disorder with hyperactivity)    Ready for discharge 7 Days Post-Op    LOS: 7 days   Matt B. Daphine Deutscher, MD, [facility] [phone] beeper [phone]  04/09/2011 11:14 AM

## 2011-04-11 ENCOUNTER — Encounter: Payer: Self-pay | Admitting: Family Medicine

## 2011-04-11 ENCOUNTER — Ambulatory Visit (INDEPENDENT_AMBULATORY_CARE_PROVIDER_SITE_OTHER): Payer: 59 | Admitting: Family Medicine

## 2011-04-11 DIAGNOSIS — R109 Unspecified abdominal pain: Secondary | ICD-10-CM

## 2011-04-11 MED ORDER — HYDROCODONE-ACETAMINOPHEN 5-325 MG PO TABS: 1.0000 | ORAL_TABLET | ORAL | Status: DC | PRN

## 2011-04-11 NOTE — Progress Notes (Signed)
  Subjective:    Patient ID: Norma Harmon, female    DOB: 1985/09/20, 25 y.o.   MRN: [id number]  HPICrystal is a 25 year old single female, who comes in today following a partial....... 4 inches...Marland KitchenMarland KitchenMarland Kitchen Colon resection.  Initially, it was felt she had Crohn's disease.  Pathology showed a stricture not Crohn's.  She's done well postop.  While she is here she wants a refill on her pain medicine.  She is on liquids doing well.  She had a bowel movement, but was liquidy on Wednesday.  No bowel movement yesterday, a little bit today.    Review of Systems    General and GI review of systems otherwise negative Objective:   Physical Exam Well-developed well-nourished, female in no acute distress.  Examination the abdomen shows the incisions are well healed.  Steri-Strips were removed.  Bowel sounds are normal.  No distention.  Diffuse tenderness no rebound.  Rectal normal.  No impaction       Assessment & Plan:  Postop bowel resection, doing very well.  I will refill her pain medication.

## 2011-04-11 NOTE — Patient Instructions (Signed)
Return next Thursday for follow-up.  Diet as outlined.  A stool softener as needed.  Call if any problems

## 2011-04-14 ENCOUNTER — Other Ambulatory Visit (INDEPENDENT_AMBULATORY_CARE_PROVIDER_SITE_OTHER): Payer: Self-pay | Admitting: Surgery

## 2011-04-14 DIAGNOSIS — K625 Hemorrhage of anus and rectum: Secondary | ICD-10-CM

## 2011-04-15 LAB — CBC
MCH: 29.8 pg (ref 26.0–34.0)
MCHC: 33 g/dL (ref 30.0–36.0)
MCV: 90.3 fL (ref 78.0–100.0)
Platelets: 271 10*3/uL (ref 150–400)
RDW: 12.2 % (ref 11.5–15.5)

## 2011-04-17 ENCOUNTER — Ambulatory Visit (INDEPENDENT_AMBULATORY_CARE_PROVIDER_SITE_OTHER): Payer: 59 | Admitting: Family Medicine

## 2011-04-17 ENCOUNTER — Encounter: Payer: Self-pay | Admitting: Family Medicine

## 2011-04-17 DIAGNOSIS — L259 Unspecified contact dermatitis, unspecified cause: Secondary | ICD-10-CM | POA: Insufficient documentation

## 2011-04-17 DIAGNOSIS — F988 Other specified behavioral and emotional disorders with onset usually occurring in childhood and adolescence: Secondary | ICD-10-CM

## 2011-04-17 MED ORDER — AMPHETAMINE-DEXTROAMPHET ER 30 MG PO CP24: 30.0000 mg | ORAL_CAPSULE | ORAL | Status: DC

## 2011-04-17 MED ORDER — AMPHETAMINE-DEXTROAMPHET ER 30 MG PO CP24: 30.0000 mg | ORAL_CAPSULE | Freq: Every day | ORAL | Status: DC

## 2011-04-17 NOTE — Patient Instructions (Signed)
Do the  skin program, that we outlined.  Call two weeks from being out of your third, Adderall prescription for refills.  Return next December for your annual yearly evaluation.  Come see [name] sooner if she have a problem Take a stool softener as needed.  Begin a walking program and gentle stretching twice daily

## 2011-04-17 NOTE — Progress Notes (Signed)
  Subjective:    Patient ID: Norma Harmon, female    DOB: 02-24-1986, 25 y.o.   MRN: [id number]  HPI  Norma Harmon is a 25 year old female, who comes in today accompanied by her mother for evaluation of multiple issues.  She is now 14 days from being discharged from the hospital with a bowel resection.  She's done really well.  She feels much better this, week she's only taken one pain pill every day or two.  She's taken over-the-counter Motrin.  Advised to limit that to 600 mg b.i.d. With food.  She has a skin rash.  She would like [name] to check.  She thinks is related to detergent.  She also needs a refill on her Adderall.  She anticipates going back to work the third week in January.  She also wants to try to get back in physical shape, and was to know what she can do.  She had blood work at Dr. Ermalene Searing office this past Monday.  She hasn't heard from them, but thinks it was probably normal.  She had some bright red rectal bleeding for a couple days and then stop.  It was painless  Review of Systems    General GI review of systems otherwise negative Objective:   Physical Exam Well-developed well-nourished, female, in no acute distress.  Abdominal exam shows the scars to be well healing.  Rectal exam shows no evidence of any external hemorrhoids.  Internal exam was negative.  Guaiac-negative.  No palpable masses.  Skin exam shows a rash on the underwear line consistent with a contact dermatitis       Assessment & Plan:

## 2011-04-23 ENCOUNTER — Telehealth: Payer: Self-pay | Admitting: Family Medicine

## 2011-04-23 NOTE — Telephone Encounter (Signed)
Rachel please call.......... We do not recommend the Tamiflu because of the potential bad side effects, and it only, shortness the duration of the illness by one day

## 2011-04-23 NOTE — Telephone Encounter (Signed)
Pt just had surgery on her stomach. Pts mom was just dx with the flu, so pt wants to get a script for Tamiflu called in to [facility] on [address], to help pt from getting flu as well.

## 2011-04-24 NOTE — Telephone Encounter (Signed)
Spoke with patient.

## 2011-04-25 ENCOUNTER — Telehealth: Payer: Self-pay | Admitting: Family Medicine

## 2011-04-25 ENCOUNTER — Encounter (INDEPENDENT_AMBULATORY_CARE_PROVIDER_SITE_OTHER): Payer: Self-pay | Admitting: Surgery

## 2011-04-25 ENCOUNTER — Ambulatory Visit: Payer: 59 | Admitting: Family Medicine

## 2011-04-25 ENCOUNTER — Ambulatory Visit (INDEPENDENT_AMBULATORY_CARE_PROVIDER_SITE_OTHER): Payer: 59 | Admitting: Surgery

## 2011-04-25 VITALS — BP 148/96 | HR 112 | Temp 96.5°F | Resp 16 | Ht 65.5 in | Wt 117.8 lb

## 2011-04-25 DIAGNOSIS — Z9884 Bariatric surgery status: Secondary | ICD-10-CM

## 2011-04-25 MED ORDER — HYDROCODONE-HOMATROPINE 5-1.5 MG/5ML PO SYRP: 5.0000 mL | ORAL_SOLUTION | Freq: Three times a day (TID) | ORAL | Status: AC | PRN

## 2011-04-25 NOTE — Progress Notes (Signed)
Norma Harmon 25 y.o.  Body mass index is 19.30 kg/(m^2).  Patient Active Problem List  Diagnoses  . Back pain  . ADD (attention deficit disorder with hyperactivity)  . Contact dermatitis    Allergies  Allergen Reactions  . Percocet (Oxycodone-Acetaminophen) Itching and Swelling    Past Surgical History  Procedure Date  . Back surgery     L5-S1  . Wisdom tooth extraction   . Appendectomy 10/2008  . Spine surgery 08/2005    L5 - S1 fusion  . Colon resection 04/02/2011    Procedure: COLON RESECTION LAPAROSCOPIC;  Surgeon: [name], MD;  Location: [facility];  Service: General;  Laterality: N/A;  Laparscopic  Assisted Ileocecostomy    TODD,JEFFREY ALLEN, MD No diagnosis found.  Incision fine.  Doing well .  She will let us know when she can return to work.  Return 2 months Matt B. Daphine Deutscher, MD, [facility] [phone] beeper [phone]  04/25/2011 2:32 PM

## 2011-04-25 NOTE — Telephone Encounter (Signed)
Pt would like something call into [facility] [address] for head congestion,body aches no fever. Pt decline ov today

## 2011-04-25 NOTE — Telephone Encounter (Signed)
rx called in and Mom is aware

## 2011-04-25 NOTE — Telephone Encounter (Signed)
Hydromet 4 ounces directions 0.5-teaspoon to 1 teaspoon at bedtime p.r.n. For cold and cough, refills x 1

## 2011-04-28 ENCOUNTER — Telehealth: Payer: Self-pay | Admitting: *Deleted

## 2011-04-28 NOTE — Telephone Encounter (Signed)
Patient calling because there is a red, hot to touch area.  Left message on machine for patient that she will need to call her surgeon or go to the ER.

## 2011-05-05 ENCOUNTER — Telehealth (INDEPENDENT_AMBULATORY_CARE_PROVIDER_SITE_OTHER): Payer: Self-pay | Admitting: Surgery

## 2011-05-05 NOTE — Telephone Encounter (Signed)
Is requesting a work release letter to be able to go back to work on 05/26/11, w/restrictions of not lifting more than 15lbs, and also would like to work only 1/2days for the 1st week of going back to work.  She also has some concerns so please call at your earliest convenience.

## 2011-05-06 ENCOUNTER — Other Ambulatory Visit: Payer: Self-pay | Admitting: Family Medicine

## 2011-05-06 NOTE — Telephone Encounter (Signed)
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 Patient states she is  exercising and having an increase of abdominal pain and would like to have a refill on her pain medication.

## 2011-05-12 ENCOUNTER — Telehealth (INDEPENDENT_AMBULATORY_CARE_PROVIDER_SITE_OTHER): Payer: Self-pay | Admitting: General Surgery

## 2011-05-12 ENCOUNTER — Telehealth: Payer: Self-pay | Admitting: Family Medicine

## 2011-05-12 NOTE — Telephone Encounter (Signed)
Pt has either an inner ear inf or sinus inf. Pt has a low grade fever and says that her immune system is real low and did not want to sch an ov unless it was absolutely necessary. Pt req a med to [facility] [address].

## 2011-05-12 NOTE — Telephone Encounter (Signed)
Spoke with patient. She is going to try OTC then call back if no improvement

## 2011-05-12 NOTE — Telephone Encounter (Signed)
Norma Harmon please call we would be happy to see her this afternoon for an evaluation.  Unable to call and medications without examining her, knowing what's wrong

## 2011-05-12 NOTE — Telephone Encounter (Signed)
Patient status post right colectomy 04/01/12, calling today complaining of abdominal pain "just like before surgery." States she has been exercising lately and wants to make sure she hasn't done anything to damage her surgical site. States she has had this pain for 4 days. Moving her bowels normally. She did feel they were a little bit harder but she has added stool softener and this has resolved that. Patient states this feels more tender at incision site and has noticed some swelling. Pain is in the RLQ. Please advise. Paged Dr Daphine Deutscher and discussed patient's symptoms. Dr Daphine Deutscher advised for patient to lay off exercising for a few weeks. I made patient aware of this. Also made her aware if she is having any vomiting, change in bowel movements or increase in pain for patient to call back immediately. Patient agrees with this and also asked about pain medicine. I saw previous telephone note where patient asked about pain medicine. I told her I would send this note to Dr Ermalene Searing nurse for her to discuss with him. Patient will call back with any questions.

## 2011-05-22 ENCOUNTER — Encounter (INDEPENDENT_AMBULATORY_CARE_PROVIDER_SITE_OTHER): Payer: Self-pay | Admitting: General Surgery

## 2011-05-23 ENCOUNTER — Telehealth (INDEPENDENT_AMBULATORY_CARE_PROVIDER_SITE_OTHER): Payer: Self-pay | Admitting: Surgery

## 2011-06-05 ENCOUNTER — Telehealth: Payer: Self-pay | Admitting: *Deleted

## 2011-06-05 MED ORDER — LEVONORGEST-ETH ESTRAD 91-DAY 0.15-0.03 MG PO TABS: 1.0000 | ORAL_TABLET | Freq: Every day | ORAL | Status: DC

## 2011-06-05 MED ORDER — LISDEXAMFETAMINE DIMESYLATE 30 MG PO CAPS: 30.0000 mg | ORAL_CAPSULE | ORAL | Status: DC

## 2011-06-05 NOTE — Telephone Encounter (Signed)
rx sent, ready to pick up, and appointment made

## 2011-06-05 NOTE — Telephone Encounter (Addendum)
Pt would like to speak to Fleet Contras about changing her [facility] meds, and wants to discuss this before asking Dr. Tawanna Cooler??

## 2011-06-05 NOTE — Telephone Encounter (Signed)
Patient would like to know if she can change from her nuvaring to seasonal BCP?  And would like to try something besides the Adderall XR 30.  Okay per Dr Tawanna Cooler with a follow up office visit in two weeks

## 2011-06-11 ENCOUNTER — Other Ambulatory Visit: Payer: Self-pay | Admitting: Family Medicine

## 2011-06-16 ENCOUNTER — Telehealth (INDEPENDENT_AMBULATORY_CARE_PROVIDER_SITE_OTHER): Payer: Self-pay | Admitting: General Surgery

## 2011-06-16 NOTE — Telephone Encounter (Signed)
The patient called stating she has had some abdominal pain and rectal bleeding, her bleeding is bright red and she denies constipation or loose stools. Pt is scheduled to be seen on 2/22 but is unsure if she can wait. I suggested if she is in that much pain to go to the ED and I would advise Dr Daphine Deutscher of her symptoms.

## 2011-06-17 ENCOUNTER — Other Ambulatory Visit: Payer: Self-pay | Admitting: Family Medicine

## 2011-06-17 ENCOUNTER — Encounter: Payer: Self-pay | Admitting: Family Medicine

## 2011-06-17 ENCOUNTER — Ambulatory Visit (INDEPENDENT_AMBULATORY_CARE_PROVIDER_SITE_OTHER): Payer: 59 | Admitting: Family Medicine

## 2011-06-17 DIAGNOSIS — Z9884 Bariatric surgery status: Secondary | ICD-10-CM

## 2011-06-17 DIAGNOSIS — R109 Unspecified abdominal pain: Secondary | ICD-10-CM | POA: Insufficient documentation

## 2011-06-17 LAB — CBC WITH DIFFERENTIAL/PLATELET
Basophils Absolute: 0 10*3/uL (ref 0.0–0.1)
Eosinophils Relative: 2.4 % (ref 0.0–5.0)
HCT: 39.7 % (ref 36.0–46.0)
Lymphs Abs: 1.2 10*3/uL (ref 0.7–4.0)
MCV: 88.2 fl (ref 78.0–100.0)
Monocytes Absolute: 0.4 10*3/uL (ref 0.1–1.0)
Monocytes Relative: 8.1 % (ref 3.0–12.0)
Neutrophils Relative %: 59.8 % (ref 43.0–77.0)
Platelets: 223 10*3/uL (ref 150.0–400.0)
RDW: 11.9 % (ref 11.5–14.6)
WBC: 4.3 10*3/uL — ABNORMAL LOW (ref 4.5–10.5)

## 2011-06-17 LAB — POCT URINALYSIS DIPSTICK
Ketones, UA: NEGATIVE
Leukocytes, UA: NEGATIVE
Nitrite, UA: NEGATIVE
Protein, UA: NEGATIVE
Urobilinogen, UA: 0.2
pH, UA: 7

## 2011-06-17 LAB — BASIC METABOLIC PANEL
BUN: 15 mg/dL (ref 6–23)
Sodium: 140 mEq/L (ref 135–145)

## 2011-06-17 LAB — HEPATIC FUNCTION PANEL
Albumin: 3.9 g/dL (ref 3.5–5.2)
Total Bilirubin: 0.4 mg/dL (ref 0.3–1.2)

## 2011-06-17 MED ORDER — HYDROCODONE-ACETAMINOPHEN 7.5-750 MG PO TABS: 1.0000 | ORAL_TABLET | Freq: Three times a day (TID) | ORAL | Status: AC | PRN

## 2011-06-17 NOTE — Patient Instructions (Signed)
Continue your normal diet  Take 2 Tylenol 3 times daily for minor pain and the Vicodin for severe pain  Lab work today  We will get you set up for a scan of her abdomen tomorrow  I will call you the report

## 2011-06-17 NOTE — Progress Notes (Signed)
  Subjective:    Patient ID: Norma Harmon, female    DOB: 01-Jan-1986, 26 y.o.   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 Postop she's done well except the abdominal pain postop has not gone away. Her last followup visit with Dr. Daphine Deutscher he felt like it was postop pain and things would improve however they do not improve. She describes a dull constant pain and occasionally she'll have a severe sharp pain. The sharp pain occurs in the right lower in the left upper quadrant. It can last for couple hours contents a 10 on a scale of 1-10. Then her pain reverts to a constant dull pain. She's had a history of ovarian cyst in 2009 which resolved spontaneously  She's had no fever chills nausea occasional vomiting no diarrhea except for loose bowel movements. She states yesterday she noticed some bright red blood in her bowel movements. Last menstrual period was February 12 through the 19th normal she started her BCPs this past Sunday.  Urinary habits normal. For pain she tries to take Tylenol with severe have to take a Vicodin to lie down.  She has a postop appointment this Friday with Dr. Daphine Deutscher   Review of Systems    general in GI review of systems otherwise negative Objective:   Physical Exam Well-developed well-nourished female in no acute distress examination the abdomen shows he hadn't be flat the bowel sounds are normal there is diffuse abdominal tenderness no masses no rebound pelvic examination external genitalia within normal limits vaginal vault was normal bimanual exam normal  Rectal examination normal stool guaiac-negative       Assessment & Plan:  Abdominal pain unknown etiology plan begin workup with labs and schedule CT scan of her abdomen with oral  contrast

## 2011-06-18 ENCOUNTER — Ambulatory Visit (INDEPENDENT_AMBULATORY_CARE_PROVIDER_SITE_OTHER)
Admission: RE | Admit: 2011-06-18 | Discharge: 2011-06-18 | Disposition: A | Discharge status: Home / Self Care | Payer: 59 | Source: Ambulatory Visit | Attending: Family Medicine | Admitting: Family Medicine

## 2011-06-18 DIAGNOSIS — Z9884 Bariatric surgery status: Secondary | ICD-10-CM

## 2011-06-18 DIAGNOSIS — R109 Unspecified abdominal pain: Secondary | ICD-10-CM

## 2011-06-18 MED ORDER — IOHEXOL 300 MG/ML  SOLN
80.0000 mL | Freq: Once | INTRAMUSCULAR | Status: AC | PRN
  Administered 2011-06-18: 80 mL via INTRAVENOUS

## 2011-06-19 ENCOUNTER — Ambulatory Visit: Payer: 59 | Admitting: Family Medicine

## 2011-06-19 ENCOUNTER — Telehealth: Payer: Self-pay | Admitting: Radiology

## 2011-06-19 NOTE — Telephone Encounter (Signed)
Completed.

## 2011-06-20 ENCOUNTER — Ambulatory Visit (INDEPENDENT_AMBULATORY_CARE_PROVIDER_SITE_OTHER): Payer: 59 | Admitting: Surgery

## 2011-06-20 ENCOUNTER — Encounter (INDEPENDENT_AMBULATORY_CARE_PROVIDER_SITE_OTHER): Payer: Self-pay | Admitting: General Surgery

## 2011-06-20 ENCOUNTER — Other Ambulatory Visit: Payer: 59

## 2011-06-20 ENCOUNTER — Encounter (INDEPENDENT_AMBULATORY_CARE_PROVIDER_SITE_OTHER): Payer: Self-pay | Admitting: Surgery

## 2011-06-20 DIAGNOSIS — R109 Unspecified abdominal pain: Secondary | ICD-10-CM

## 2011-06-20 NOTE — Progress Notes (Signed)
Norma Harmon 25 y.o.  Body mass index is 20.92 kg/(m^2).  Patient Active Problem List  Diagnoses  . Back pain  . ADD (attention deficit disorder with hyperactivity)  . Contact dermatitis  . History of laparoscopic adjustable gastric banding  . Abdominal pain    Allergies  Allergen Reactions  . Percocet (Oxycodone-Acetaminophen) Itching and Swelling    Past Surgical History  Procedure Date  . Back surgery     L5-S1  . Wisdom tooth extraction   . Appendectomy 10/2008  . Spine surgery 08/2005    L5 - S1 fusion  . Colon resection 04/02/2011    Procedure: COLON RESECTION LAPAROSCOPIC;  Surgeon: [name], MD;  Location: [facility];  Service: General;  Laterality: N/A;  Laparscopic  Assisted Ileocecostomy    Evette Georges, MD, MD No diagnosis found.  Still having some postprandial pain. She is relating it to certain foods groups. This is similar to the pain she was having before her surgery. I will suggest that she maintain a food diary and that she see Dr. Juanda Chance who may want to consider endoscopic biopsy for celiac disease. Her incision is otherwise healed femoral using her to go back to work at the [employer] full time and without restrictions. Matt B. Daphine Deutscher, MD, [facility] [phone] beeper [phone]  06/20/2011 9:40 AM

## 2011-06-20 NOTE — Patient Instructions (Signed)
May return to work full-time and without restrictions Followup with Dr. Juanda Chance for food allergy workup

## 2011-06-23 ENCOUNTER — Encounter: Payer: Self-pay | Admitting: Family Medicine

## 2011-06-23 ENCOUNTER — Ambulatory Visit (INDEPENDENT_AMBULATORY_CARE_PROVIDER_SITE_OTHER): Payer: 59 | Admitting: Family Medicine

## 2011-06-23 DIAGNOSIS — R109 Unspecified abdominal pain: Secondary | ICD-10-CM

## 2011-06-23 NOTE — Patient Instructions (Signed)
Stay on a complete fat free diet and no lettuce  We will get you set up for a special scan of the gallbladder  Return a couple days after your study for followup

## 2011-06-23 NOTE — Progress Notes (Signed)
  Subjective:    Patient ID: Norma Harmon, female    DOB: 1985-05-26, 26 y.o.   MRN: [id number]  HPI  Norma Harmon is a 26 year old single female nonsmoker who comes in today for evaluation of abdominal pain  As noticed previously she spoke. Partial colon resection initially they thought it was Crohn's disease it turned out to be a stricture. Postop she's been having persistent intermittent abdominal pain. Workup by me was negative last week including a CT scan and labs. She went to see her surgeon Dr. Daphine Harmon who felt there was some type of food related allergy and is referred her back to Dr. Dickie Harmon.  In reviewing her diet she has symptoms of fatty food intolerance also less Friday she had an episode of severe pain when she was eating less negative family history of gallbladder disease  Review of Systems    general and GI review of systems otherwise negative Objective:   Physical Exam  Well-developed well-nourished female in acute distress abdominal exam negative      Assessment & Plan:  Persistent recurrent abdominal pain question gallbladder disease plan HIDA scan fat-free diet

## 2011-06-24 ENCOUNTER — Other Ambulatory Visit: Payer: Self-pay | Admitting: Family Medicine

## 2011-06-30 ENCOUNTER — Encounter (HOSPITAL_COMMUNITY)
Admission: RE | Admit: 2011-06-30 | Discharge: 2011-06-30 | Disposition: A | Discharge status: Home / Self Care | Payer: 59 | Source: Ambulatory Visit | Attending: Family Medicine | Admitting: Family Medicine

## 2011-06-30 DIAGNOSIS — R109 Unspecified abdominal pain: Secondary | ICD-10-CM | POA: Insufficient documentation

## 2011-06-30 MED ORDER — SINCALIDE 5 MCG IJ SOLR
INTRAMUSCULAR | Status: AC
  Administered 2011-06-30: 1.14 ug
  Filled 2011-06-30: qty 5

## 2011-06-30 MED ORDER — TECHNETIUM TC 99M MEBROFENIN IV KIT
5.0000 | PACK | Freq: Once | INTRAVENOUS | Status: AC | PRN
  Administered 2011-06-30: 5 via INTRAVENOUS

## 2011-07-02 ENCOUNTER — Encounter (INDEPENDENT_AMBULATORY_CARE_PROVIDER_SITE_OTHER): Payer: Self-pay | Admitting: Surgery

## 2011-07-02 ENCOUNTER — Ambulatory Visit (INDEPENDENT_AMBULATORY_CARE_PROVIDER_SITE_OTHER): Payer: 59 | Admitting: Surgery

## 2011-07-02 DIAGNOSIS — R109 Unspecified abdominal pain: Secondary | ICD-10-CM

## 2011-07-02 NOTE — Progress Notes (Signed)
Norma Harmon and her mother came in because of the postprandial discomfort that she has. These were not definite and sometimes actually were further into her left lower quadrant. She had a HIDA scan with a 4% ejection fraction. She's also on Vivance and has been on Adderol since last year.  I'm not certain that she needs to stay on those at this time and I would like to try her off these amphetamines and see if this affects her diet and a positive Y. Also we'll see if that will help her discomfort go away. With the meantime her to get her to see a dietitian to try to help her have a healthy balanced diet I will see her back in 4 weeks.

## 2011-07-02 NOTE — Patient Instructions (Signed)
See nutritionist.  Stop Vivance

## 2011-07-02 NOTE — Progress Notes (Signed)
Addended by: Latricia Heft on: 07/02/2011 04:02 PM   Modules accepted: Orders

## 2011-07-03 ENCOUNTER — Telehealth: Payer: Self-pay | Admitting: Family Medicine

## 2011-07-03 ENCOUNTER — Ambulatory Visit (INDEPENDENT_AMBULATORY_CARE_PROVIDER_SITE_OTHER): Payer: 59 | Admitting: Family Medicine

## 2011-07-03 ENCOUNTER — Encounter: Payer: Self-pay | Admitting: Family Medicine

## 2011-07-03 DIAGNOSIS — J069 Acute upper respiratory infection, unspecified: Secondary | ICD-10-CM | POA: Insufficient documentation

## 2011-07-03 DIAGNOSIS — R109 Unspecified abdominal pain: Secondary | ICD-10-CM

## 2011-07-03 DIAGNOSIS — F909 Attention-deficit hyperactivity disorder, unspecified type: Secondary | ICD-10-CM

## 2011-07-03 MED ORDER — AMPHETAMINE-DEXTROAMPHETAMINE 15 MG PO TABS: 15.0000 mg | ORAL_TABLET | Freq: Every day | ORAL | Status: DC

## 2011-07-03 MED ORDER — HYDROCODONE-HOMATROPINE 5-1.5 MG/5ML PO SYRP: ORAL_SOLUTION | ORAL | Status: DC

## 2011-07-03 NOTE — Telephone Encounter (Signed)
Please work in schedule today

## 2011-07-03 NOTE — Patient Instructions (Signed)
Drink lots of liquids 1/2-1 teaspoon of the Hydromet at bedtime when necessary for cough and cold  Switch to the 15 mg of Adderall twice daily followup in 4 weeks

## 2011-07-03 NOTE — Telephone Encounter (Signed)
Patient called stating she is having a cough and hoarseness and would like to come in for an appt but there are no appts. Can we work her in or call something in for her? Please advise.

## 2011-07-03 NOTE — Progress Notes (Signed)
  Subjective:    Patient ID: Norma Harmon, female    DOB: 03/04/1986, 26 y.o.   MRN: [id number]  HPI Norma Harmon is a 26 year old single female nonsmoker who comes in today for evaluation of 3 problems  For the past 6 days she's had head congestion voice loss sore throat and cough no fever  She has an underlying history of ADD for which she takes Adderall 30 mg long-acting daily however as it wears off around noon. We talked about various options we elected to switch and do the short acting twice daily  She takes a continuous birth control pills however she's having some spotting.  She saw her general surgeon Dr. Daphine Deutscher. She has a very low ejection fraction on her HIDA scan and PC abdominal pain. He suggested she try a fat-free diet and he'll see her in followup in a month   Review of Systems General and pulmonary review of systems otherwise negative    Objective:   Physical Exam  Well-developed well-nourished female in no acute distress HEENT negative neck was supple no adenopathy lungs are clear      Assessment & Plan:  Viral syndrome plan treat symptomatically with Tylenol and Hydromet cough syrup  Biliary dyskinesia plan the fat-free diet followup by Dr. Daphine Deutscher  Adult ADD continue Adderall but switch it 15 mg twice a day followup in 4 weeks

## 2011-07-07 ENCOUNTER — Telehealth: Payer: Self-pay | Admitting: *Deleted

## 2011-07-07 NOTE — Telephone Encounter (Signed)
Pt's insurance will not pay for the same amount of Adderal she has been getting.  She needs her prescriptions written 30 mg 1/2 daily.  Please call her to confirm.

## 2011-07-07 NOTE — Telephone Encounter (Signed)
Rachel please call 

## 2011-07-08 NOTE — Telephone Encounter (Signed)
Called and Left message on machine for patient  To return our call

## 2011-07-09 NOTE — Telephone Encounter (Signed)
Pt returned call and said that generic adderall would be fine.

## 2011-07-10 MED ORDER — AMPHETAMINE-DEXTROAMPHET ER 30 MG PO CP24: 30.0000 mg | ORAL_CAPSULE | ORAL | Status: DC

## 2011-07-10 NOTE — Telephone Encounter (Signed)
rx ready for pick up and patient is aware  

## 2011-07-14 ENCOUNTER — Other Ambulatory Visit: Payer: Self-pay | Admitting: *Deleted

## 2011-07-14 MED ORDER — AMPHETAMINE-DEXTROAMPHETAMINE 30 MG PO TABS: 30.0000 mg | ORAL_TABLET | Freq: Two times a day (BID) | ORAL | Status: DC

## 2011-07-14 MED ORDER — AMPHETAMINE-DEXTROAMPHETAMINE 30 MG PO TABS: 30.0000 mg | ORAL_TABLET | Freq: Every day | ORAL | Status: DC

## 2011-07-14 NOTE — Telephone Encounter (Signed)
Patient wanted Rx for adderall 30 twice daily.  She brought back other prescription to shred.

## 2011-07-31 ENCOUNTER — Ambulatory Visit: Payer: 59 | Admitting: Family Medicine

## 2011-08-05 ENCOUNTER — Encounter: Payer: Self-pay | Admitting: Family Medicine

## 2011-08-05 ENCOUNTER — Ambulatory Visit (INDEPENDENT_AMBULATORY_CARE_PROVIDER_SITE_OTHER): Payer: 59 | Admitting: Family Medicine

## 2011-08-05 VITALS — BP 120/80 | Temp 98.9°F | Wt 132.0 lb

## 2011-08-05 DIAGNOSIS — R609 Edema, unspecified: Secondary | ICD-10-CM | POA: Insufficient documentation

## 2011-08-05 DIAGNOSIS — D239 Other benign neoplasm of skin, unspecified: Secondary | ICD-10-CM

## 2011-08-05 DIAGNOSIS — R109 Unspecified abdominal pain: Secondary | ICD-10-CM

## 2011-08-05 DIAGNOSIS — R600 Localized edema: Secondary | ICD-10-CM | POA: Insufficient documentation

## 2011-08-05 MED ORDER — HYDROCHLOROTHIAZIDE 25 MG PO TABS: 25.0000 mg | ORAL_TABLET | Freq: Every day | ORAL | Status: DC

## 2011-08-05 NOTE — Progress Notes (Signed)
  Subjective:    Patient ID: Norma Harmon, female    DOB: [birth year], 26 y.o.   MRN: [id number]  HPI Norma Harmon is a 26 year old single female who comes in today for followup of 3 problems  Admit [name] previously in December she had a portion of her colon removed. 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 She's also had recurrent abdominal pain since that time and workup showed that she has gallbladder disease. She finds it difficult to stay and complete fat-free diet and does have epigastric abdominal pain when she eats fatty foods or potato chips.  She does have some swelling in her hands and feet especially week before.  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   Review of Systems General he is systems otherwise negative    Objective:   Physical Exam  Well-developed and nourished female in no acute distress examination the abdomen is negative no edema  She was taken to the treatment room  Lesion #1 was 8 mm x8 mm mm midline back  Lesion #28 mm x8 mm midline abdomen epigastric area  Lesion # 3  8     mm x8 mm right upper anterior chest wall above the nipple     all 3 lesions were removed in toto with 2 mm margins Band-Aids were applied and sent for pathologic analysis Assessment & Plan:  Abdominal pain secondary to gallbladder dysfunction continue fat-free diet  Lesions x3 remediate removed probably dysplastic nevi rule out skin cancer  Personal edema hydrochlorothiazide 25 mg daily when necessary

## 2011-08-05 NOTE — Patient Instructions (Signed)
Continue your current medications  Return in the fall for your annual physical exam sooner if any problems  We will call you within 2 weeks with report on the 3 lesions were removed

## 2011-08-06 ENCOUNTER — Other Ambulatory Visit: Payer: Self-pay | Admitting: Family Medicine

## 2011-08-12 ENCOUNTER — Encounter: Payer: Self-pay | Admitting: *Deleted

## 2011-08-12 ENCOUNTER — Encounter: Payer: 59 | Attending: Surgery | Admitting: *Deleted

## 2011-08-12 DIAGNOSIS — R109 Unspecified abdominal pain: Secondary | ICD-10-CM | POA: Insufficient documentation

## 2011-08-12 DIAGNOSIS — Z713 Dietary counseling and surveillance: Secondary | ICD-10-CM | POA: Insufficient documentation

## 2011-08-12 NOTE — Patient Instructions (Signed)
Goals:   Follow nutritional recommendations (see yellow card).  Limit fat intake to 40-45 grams a day.   Try a protein shake or protein bar 1-2 times a day to increase protein intake and replenish stores.   Eat more often throughout the day.   Avoid fried/high fat foods.

## 2011-08-12 NOTE — Progress Notes (Signed)
  Medical Nutrition Therapy:  Appt start time: 0800 end time:  0900.  Assessment:  Primary concerns today: Abdominal Pain.  Pt here today for nutrition counseling for low fat diet.  Pt is 4 mo s/p bowel resection where she reports 7 cm of small intestine removed. Meal skipping noted. Has pain after eating many foods, though food recall reveals high fat choices (see 24-hour recall below).  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 Reports loss of appetite and weakness, likely d/t low dietary intake of protein and energy.   TANITA  BODY COMP ANALYZER RESULTS  08/12/11     %Fat 25.1%     FM (lbs) 32.5     FFM (lbs) 97.0     TBW (lbs) 71.0      MEDICATIONS: See medication list; reconciled with pt.    DIETARY INTAKE:  Usual eating pattern includes 1-2 meals and 0 snacks per day.  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 Sometimes does "Insanity" video at home.  Estimated energy needs: 1400-1500 calories 175-190 g carbohydrates 85-110 g protein 40-42 g fat  Progress Towards Goal(s):  In progress.   Nutritional Diagnosis:  Roca-3.2 Unintentional weight loss related to recent bowel resection and abdominal pain as evidenced by patient-reported 24-hour recall and weight loss of 7 lbs in last week.    Intervention/Goals:     Follow nutritional recommendations (see yellow card).  Limit fat intake to 40-42 grams a day.   Try a protein shake or protein bar 1-2 times a day to increase protein intake and re-train appetite.   Eat smaller meals/snacks more often throughout the day.    Avoid fried/high fat foods.   Handouts given during visit include:  Fiber Foods List  Heart Healthy Foods (low fat foods)  Low CHO Snack list  Gallbladder Nutrition Therapy  Samples Dispensed:   [employer] Protein Powder (Choc): 1 pkt Lot # [id number]; Exp: 10/14  Unjury (Unflavored): 3 pkts Lot # [id number];  Exp: 05/14  Monitoring/Evaluation:  Dietary intake, exercise, and body weight in 8 week(s).

## 2011-08-25 ENCOUNTER — Encounter: Payer: Self-pay | Admitting: Family

## 2011-08-25 ENCOUNTER — Ambulatory Visit (INDEPENDENT_AMBULATORY_CARE_PROVIDER_SITE_OTHER): Payer: 59 | Admitting: Family

## 2011-08-25 VITALS — BP 140/80 | Temp 98.9°F | Wt 131.0 lb

## 2011-08-25 DIAGNOSIS — J029 Acute pharyngitis, unspecified: Secondary | ICD-10-CM

## 2011-08-25 MED ORDER — AMOXICILLIN 500 MG PO TABS: 1000.0000 mg | ORAL_TABLET | Freq: Two times a day (BID) | ORAL | Status: AC

## 2011-08-25 NOTE — Progress Notes (Signed)
Subjective:    Patient ID: Norma Harmon, female    DOB: 06-Aug-1985, 26 y.o.   MRN: [id number]  Sore Throat  This is a new problem. The current episode started in the past 7 days. The problem has been gradually worsening. Neither side of throat is experiencing more pain than the other. The maximum temperature recorded prior to her arrival was 100 - 100.9 F. The fever has been present for 1 to 2 days. The pain is at a severity of 6/10. The pain is moderate. Associated symptoms include swollen glands. Pertinent negatives include no congestion, coughing, diarrhea, drooling, shortness of breath or stridor. She has tried gargles and NSAIDs for the symptoms. The treatment provided moderate relief.      Review of Systems  Constitutional: Positive for fever and fatigue.  HENT: Positive for sore throat. Negative for congestion, sneezing and drooling.   Respiratory: Negative.  Negative for cough, shortness of breath and stridor.   Cardiovascular: Negative.   Gastrointestinal: Negative for diarrhea.  Musculoskeletal: Negative.   Skin: Negative.   Neurological: Negative.   Hematological: Negative.   Psychiatric/Behavioral: Negative.    Past Medical History  Diagnosis Date  . IBS (irritable bowel syndrome)   . Ovarian cyst   . Diarrhea   . Crohn's   . Family history of malignant neoplasm of gastrointestinal tract   . PONV (postoperative nausea and vomiting)   . Shortness of breath     states close to surgery she having more anxiety, denies URI  . GERD (gastroesophageal reflux disease)   . Arthritis     lumbar back  . Anxiety   . Mental disorder     hx ADD- recently started on Adderol  . Skin cancer     abdomen and neck- states not melanoma    History   Social History  . Marital Status: Married    Spouse Name: N/A    Number of Children: 0  . Years of Education: N/A   Occupational History  . Radio producer    Social History Main Topics  . Smoking status: Former Smoker  -- 0.2 packs/day for 2 years    Types: Cigarettes    Quit date: 04/28/2010  . Smokeless tobacco: Former Neurosurgeon    Quit date: 01/29/2011  . Alcohol Use: No     rarely  . Drug Use: No  . Sexually Active: Yes    Birth Control/ Protection: Other-see comments     nuvaring   Other Topics Concern  . Not on file   Social History Narrative  . No narrative on file    Past Surgical History  Procedure Date  . Back surgery     L5-S1  . Wisdom tooth extraction   . Appendectomy 10/2008  . Spine surgery 08/2005    L5 - S1 fusion  . Colon resection 04/02/2011    Procedure: COLON RESECTION LAPAROSCOPIC;  Surgeon: [name], MD;  Location: [facility];  Service: General;  Laterality: N/A;  Laparscopic  Assisted Ileocecostomy     Family History  Problem Relation Age of Onset  . Colon cancer Paternal Grandfather   . Cancer Paternal Grandfather     colon  . Breast cancer Paternal Grandmother   . Colon polyps Paternal Grandmother   . Cancer Paternal Grandmother     breast  . Diabetes Maternal Grandmother   . Heart disease Maternal Grandmother   . Cirrhosis Maternal Grandfather     heavy drinker    Allergies  Allergen  Reactions  . Percocet (Oxycodone-Acetaminophen) Itching and Swelling    Current Outpatient Prescriptions on File Prior to Visit  Medication Sig Dispense Refill  . ALPRAZolam (XANAX) 0.25 MG tablet Take 0.25 mg by mouth at bedtime as needed.       Marland Kitchen aspirin 325 MG tablet Take 325 mg by mouth daily.      Marland Kitchen BIOTIN 5000 PO Take 1 tablet by mouth daily.       . hydrochlorothiazide (HYDRODIURIL) 25 MG tablet Take 1 tablet (25 mg total) by mouth daily.  90 tablet  3  . HYDROcodone-acetaminophen (NORCO) 5-325 MG per tablet TAKE 1 TABLET BY MOUTH EVERY 4 HOURS AS NEEDED FOR PAIN  50 tablet  0  . levonorgestrel-ethinyl estradiol (SEASONALE,INTROVALE,JOLESSA) 0.15-0.03 MG tablet Take 1 tablet by mouth daily.  1 Package  3  . multivitamin (THERAGRAN) per tablet Take 1 tablet by  mouth daily.        . promethazine (PHENERGAN) 12.5 MG tablet Take 12.5 mg by mouth every 6 (six) hours as needed. Take 1 tablet by mouth every 4-6 hours as needed. (pharmacy-please d/c rx for zofran. Insurance will not cover)      . amphetamine-dextroamphetamine (ADDERALL) 30 MG tablet Take 1 tablet (30 mg total) by mouth 2 (two) times daily.  60 tablet  0  . amphetamine-dextroamphetamine (ADDERALL) 30 MG tablet Take 1 tablet (30 mg total) by mouth 2 (two) times daily.  60 tablet  0  . amphetamine-dextroamphetamine (ADDERALL, 30MG ,) 30 MG tablet Take 1 tablet (30 mg total) by mouth 2 (two) times daily.  60 tablet  0  . lisdexamfetamine (VYVANSE) 30 MG capsule Take 30 mg by mouth every morning.        BP 140/80  Temp(Src) 98.9 F (37.2 C) (Oral)  Wt 131 lb (59.421 kg)chart    Objective:   Physical Exam  Constitutional: She is oriented to person, place, and time. She appears well-developed and well-nourished.  HENT:  Right Ear: External ear normal.  Left Ear: External ear normal.  Nose: Nose normal.  Mouth/Throat: Oropharyngeal exudate, posterior oropharyngeal edema and posterior oropharyngeal erythema present.  Eyes: Pupils are equal, round, and reactive to light.  Neck: Normal range of motion. Neck supple.  Cardiovascular: Normal rate, regular rhythm and normal heart sounds.   Pulmonary/Chest: Effort normal and breath sounds normal.  Abdominal: Soft. Bowel sounds are normal.  Lymphadenopathy:    She has cervical adenopathy.  Neurological: She is alert and oriented to person, place, and time.  Skin: Skin is warm and dry.  Psychiatric: She has a normal mood and affect.          Assessment & Plan:  Assessment: Pharyngitis, lymphadenopathy  Plan: Amoxicillin 500 mg 2 capsules by mouth twice a day x10 days. Rest. Drink plenty of fluids. Out of work until 08/27/2011. Call the office symptoms worsen or persist. Recheck a schedule, when necessary.

## 2011-08-25 NOTE — Patient Instructions (Signed)

## 2011-08-29 ENCOUNTER — Encounter (INDEPENDENT_AMBULATORY_CARE_PROVIDER_SITE_OTHER): Payer: 59 | Admitting: Surgery

## 2011-09-17 ENCOUNTER — Telehealth: Payer: Self-pay

## 2011-09-17 NOTE — Telephone Encounter (Signed)
Took triage call - pt states she is having urine frequency and wants abx called in for UTI sx. - will be hard to come in because of work schedule - please call at work today . [phone]

## 2011-09-18 ENCOUNTER — Telehealth: Payer: Self-pay | Admitting: Family Medicine

## 2011-09-18 MED ORDER — SULFAMETHOXAZOLE-TRIMETHOPRIM 800-160 MG PO TABS: 1.0000 | ORAL_TABLET | Freq: Two times a day (BID) | ORAL | Status: AC

## 2011-09-18 NOTE — Telephone Encounter (Signed)
Septra DS #20 directions one by mouth twice a day Toprol MD for UTI no refills

## 2011-09-18 NOTE — Telephone Encounter (Signed)
Left message on machine for patient for information about which pharmacy

## 2011-09-18 NOTE — Telephone Encounter (Signed)
Patient states she is working 12 hour shifts through Monday and is unable to come in

## 2011-09-18 NOTE — Telephone Encounter (Signed)
Pt called back again this morning. Is wanting to know if she can get that medication. If so, she wants it called in to the pharmacy close to her work. She did not specify which pharmacy that it. Please call her to find out and let her know about the rx. Thanks!

## 2011-09-18 NOTE — Telephone Encounter (Signed)
Rx sent 

## 2011-09-18 NOTE — Telephone Encounter (Signed)
Please call happy to see anytime today

## 2011-10-02 ENCOUNTER — Other Ambulatory Visit: Payer: Self-pay | Admitting: Family Medicine

## 2011-10-02 DIAGNOSIS — K829 Disease of gallbladder, unspecified: Secondary | ICD-10-CM

## 2011-10-02 MED ORDER — TRAMADOL HCL 50 MG PO TABS: ORAL_TABLET | ORAL | Status: DC

## 2011-10-22 ENCOUNTER — Telehealth: Payer: Self-pay | Admitting: Family Medicine

## 2011-10-22 DIAGNOSIS — E538 Deficiency of other specified B group vitamins: Secondary | ICD-10-CM

## 2011-10-22 NOTE — Telephone Encounter (Signed)
Caller: [name]/Patient; Phone Number: [phone]; Message from caller: Pt.  Calling, would like to come in Friday, 6/28 for lab work rechecked.  Her B12 has been low and would like levels rechecked.  Then would like to have an appt.  One day next week to see Dr.  Tawanna Cooler.  She is having more fatigue.

## 2011-10-22 NOTE — Telephone Encounter (Signed)
Patient called stating that she need a refill of her adderall. Please assist.  °

## 2011-10-22 NOTE — Telephone Encounter (Signed)
Ok to order B12?

## 2011-10-23 MED ORDER — AMPHETAMINE-DEXTROAMPHETAMINE 30 MG PO TABS: 30.0000 mg | ORAL_TABLET | Freq: Two times a day (BID) | ORAL | Status: DC

## 2011-10-23 NOTE — Telephone Encounter (Signed)
Fleet Contras please call,,,,,,,,,, set her up for her labs on Friday and see me the Monday after the holiday July 8

## 2011-10-23 NOTE — Telephone Encounter (Signed)
Rx ready for pick up. Lab ordered. Patient to call back to schedule an appointment

## 2011-10-24 ENCOUNTER — Encounter (INDEPENDENT_AMBULATORY_CARE_PROVIDER_SITE_OTHER): Payer: Self-pay | Admitting: Surgery

## 2011-10-24 ENCOUNTER — Other Ambulatory Visit (INDEPENDENT_AMBULATORY_CARE_PROVIDER_SITE_OTHER): Payer: Self-pay | Admitting: General Surgery

## 2011-10-24 ENCOUNTER — Ambulatory Visit (INDEPENDENT_AMBULATORY_CARE_PROVIDER_SITE_OTHER): Payer: 59 | Admitting: Surgery

## 2011-10-24 VITALS — BP 124/74 | HR 90 | Temp 97.8°F | Ht 65.5 in | Wt 131.4 lb

## 2011-10-24 DIAGNOSIS — R109 Unspecified abdominal pain: Secondary | ICD-10-CM

## 2011-10-24 NOTE — Patient Instructions (Addendum)
Followup with Dr. Daphine Deutscher after CT enterography

## 2011-10-24 NOTE — Progress Notes (Signed)
Norma Harmon 25 y.o.  Body mass index is 21.53 kg/(m^2).  Patient Active Problem List  Diagnosis  . Back pain  . ADD (attention deficit disorder with hyperactivity)  . Contact dermatitis  . Abdominal pain  . Viral URI  . Dysplastic nevi  . Peripheral edema    Allergies  Allergen Reactions  . Percocet (Oxycodone-Acetaminophen) Itching and Swelling    Past Surgical History  Procedure Date  . Back surgery     L5-S1  . Wisdom tooth extraction   . Appendectomy 10/2008  . Spine surgery 08/2005    L5 - S1 fusion  . Colon resection 04/02/2011    Procedure: COLON RESECTION LAPAROSCOPIC;  Surgeon: [name], MD;  Location: [facility];  Service: General;  Laterality: N/A;  Laparscopic  Assisted Ileocecostomy    TODD,JEFFREY ALLEN, MD No diagnosis found.  Norma Harmon comes in today because of increasing abdominal pain.  For a while after her surgery she felt like she was getting much better. Apparently Dr. Tawanna Cooler has gotten a HIDA scan which shows some low activity in her gallbladder. She says her certain things such as popcorn because abdominal pain that is crampy in nature. It almost sounds like she might have some stricture. She raised the question about her prior diagnosis of Crohn's disease. 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  In addition she's had some pain below her incision on the right side. I did close her with absorbable suture. I don't feel a hernia.  I think it we will get a CT urography to see if she has any strictured areas or kinks that could be causing this food intolerance. I'll see her back after that. She denies any stressors at work or a with her relationship with her boyfriend.  See back after CT enterography Matt B. Daphine Deutscher, MD, [facility] [phone] beeper [phone]  10/24/2011 11:02 AM

## 2011-10-29 ENCOUNTER — Telehealth: Payer: Self-pay | Admitting: Family Medicine

## 2011-10-29 ENCOUNTER — Other Ambulatory Visit: Payer: 59

## 2011-10-29 ENCOUNTER — Ambulatory Visit
Admission: RE | Admit: 2011-10-29 | Discharge: 2011-10-29 | Disposition: A | Discharge status: Home / Self Care | Payer: 59 | Source: Ambulatory Visit | Attending: Surgery | Admitting: Surgery

## 2011-10-29 DIAGNOSIS — R109 Unspecified abdominal pain: Secondary | ICD-10-CM

## 2011-10-29 DIAGNOSIS — E538 Deficiency of other specified B group vitamins: Secondary | ICD-10-CM

## 2011-10-29 MED ORDER — IOHEXOL 300 MG/ML  SOLN
100.0000 mL | Freq: Once | INTRAMUSCULAR | Status: AC | PRN
  Administered 2011-10-29: 100 mL via INTRAVENOUS

## 2011-10-29 NOTE — Telephone Encounter (Signed)
Pt called and was suppose to get b-12 lvls drawn today, but could not make it in for labs. Pt is req to get an order to have labs drawn at [facility] office for some time next week.

## 2011-10-29 NOTE — Telephone Encounter (Signed)
Lab test ordered.

## 2011-11-03 ENCOUNTER — Telehealth (INDEPENDENT_AMBULATORY_CARE_PROVIDER_SITE_OTHER): Payer: Self-pay | Admitting: General Surgery

## 2011-11-03 ENCOUNTER — Encounter: Payer: Self-pay | Admitting: Family Medicine

## 2011-11-03 ENCOUNTER — Ambulatory Visit (INDEPENDENT_AMBULATORY_CARE_PROVIDER_SITE_OTHER): Payer: 59 | Admitting: Family Medicine

## 2011-11-03 VITALS — BP 120/80 | Temp 98.1°F | Wt 130.0 lb

## 2011-11-03 DIAGNOSIS — D239 Other benign neoplasm of skin, unspecified: Secondary | ICD-10-CM

## 2011-11-03 DIAGNOSIS — R109 Unspecified abdominal pain: Secondary | ICD-10-CM

## 2011-11-03 DIAGNOSIS — R5383 Other fatigue: Secondary | ICD-10-CM

## 2011-11-03 DIAGNOSIS — R5381 Other malaise: Secondary | ICD-10-CM

## 2011-11-03 DIAGNOSIS — M549 Dorsalgia, unspecified: Secondary | ICD-10-CM

## 2011-11-03 LAB — CBC WITH DIFFERENTIAL/PLATELET
Eosinophils Relative: 0.6 % (ref 0.0–5.0)
HCT: 38.9 % (ref 36.0–46.0)
Lymphocytes Relative: 30.6 % (ref 12.0–46.0)
Monocytes Relative: 7.9 % (ref 3.0–12.0)
Neutrophils Relative %: 60.3 % (ref 43.0–77.0)
Platelets: 196 10*3/uL (ref 150.0–400.0)
WBC: 5 10*3/uL (ref 4.5–10.5)

## 2011-11-03 LAB — POCT URINALYSIS DIPSTICK
Glucose, UA: NEGATIVE
Nitrite, UA: NEGATIVE
Spec Grav, UA: 1.015
Urobilinogen, UA: 0.2

## 2011-11-03 LAB — VITAMIN B12: Vitamin B-12: 468 pg/mL (ref 211–911)

## 2011-11-03 MED ORDER — HYDROCODONE-ACETAMINOPHEN 7.5-750 MG PO TABS: ORAL_TABLET | ORAL | Status: DC

## 2011-11-03 MED ORDER — DIAZEPAM 2 MG PO TABS: ORAL_TABLET | ORAL | Status: DC

## 2011-11-03 NOTE — Telephone Encounter (Signed)
Pt calling for CT results (given:  Negative) and to request pain meds and follow-up appt.  She states she has been using Ultram, which works unless pain is severe.  Pt is glad that the CT did not show anything really bad and is anxious to find the source of the pain.  Please call her with plan from here forward.

## 2011-11-03 NOTE — Progress Notes (Signed)
  Subjective:    Patient ID: Norma Harmon, female    DOB: May 24, 1985, 26 y.o.   MRN: [id number]  HPI crystal is a 26 year old female who comes in today for evaluation of 3 issues  She states last Wednesday or Thursday she began having severe back pain she points to the T10 area. No history of trauma. She says the pain is constant sometimes sharp sometimes dull a 3 at its minimum 9 when its worse it hurts if she takes a deep breath twists or moves. Again no history of trauma. She does have a history of gallbladder disease but thinks that's asymptomatic  She had another followup visit with Dr. Daphine Harmon who did her resection. Followup CT scan of the abdomen with oral and IV contrast last Wednesday apparently was normal.  Removed a dysplastic nevi from her right breast and now she has another lesion up above the clavicle   Review of Systems    general GI neuromuscular skeletal review of systems otherwise negative Objective:   Physical Exam Well-developed and nourished female in no acute distress examination spine was normal except for some palpable tenderness T10 right paraspinal muscles. Neurologic exam normal cardiopulmonary exam normal abdominal exam normal skin exam normal except for a black lesion right clavicle       Assessment & Plan:  Right-sided back pain,,,,,,,,,,, Motrin 600 twice a day Vicodin and Valium each bedtime when necessary  Abdominal pain unknown etiology followup with Dr. Daphine Harmon we may need to get Dr. Dickie Harmon back involved  Dysplastic nevi reexcision of the one on her right breast and a new lesion right clavicle

## 2011-11-03 NOTE — Patient Instructions (Addendum)
Motrin 600 mg twice daily and Valium and Vicodin one half of each bedtime when necessary for severe pain  We will call you the report on the 2 lesions were excised within 2 weeks  If after you talk with Dr. Daphine Deutscher he does not find anything surgically that he can do to address your pain then I think the next step is to go back and see Dr. Dickie La

## 2011-11-05 ENCOUNTER — Ambulatory Visit: Payer: 59 | Admitting: Family Medicine

## 2011-11-26 ENCOUNTER — Telehealth: Payer: Self-pay | Admitting: Family Medicine

## 2011-11-26 MED ORDER — VARENICLINE TARTRATE 0.5 MG PO TABS: 0.5000 mg | ORAL_TABLET | Freq: Two times a day (BID) | ORAL | Status: AC

## 2011-11-26 NOTE — Telephone Encounter (Signed)
Caller: Natoria/Patient; PCP: Roderick Pee.; CB#: [phone]; ; ; Call regarding Chantix;  Camila states she would like a prescription for Chantix called in to [facility] [phone]. Trenace can be reached at [phone].

## 2011-12-03 ENCOUNTER — Telehealth: Payer: Self-pay | Admitting: Gastroenterology

## 2011-12-03 NOTE — Telephone Encounter (Signed)
Please call in Align 1 po qd, and Bentyl 20 mg po bid #30. Dr Russella Dar wants her to be seen next week. I will see her if there is a cancellation.

## 2011-12-03 NOTE — Telephone Encounter (Signed)
On call note @ 1705. Pt complains of several weeks of minor rectal bleeding with diarrhea. Her symptoms have worsened over the past few days. Has appt with DB in mid Sept. but can't wait that long to be seen. Mild lower abd pain noted too.. She underwent and ileocecal resection in 2012 that showed mild ileitis. Advised to monitor for worsening bleeding and seek care in the ED if she has worsening symptoms. She needs to be evaluated in the office this week.

## 2011-12-04 ENCOUNTER — Encounter: Payer: Self-pay | Admitting: Nurse Practitioner

## 2011-12-04 ENCOUNTER — Ambulatory Visit (INDEPENDENT_AMBULATORY_CARE_PROVIDER_SITE_OTHER): Payer: 59 | Admitting: Nurse Practitioner

## 2011-12-04 VITALS — BP 110/60 | HR 104 | Ht 65.0 in | Wt 127.4 lb

## 2011-12-04 DIAGNOSIS — K649 Unspecified hemorrhoids: Secondary | ICD-10-CM

## 2011-12-04 DIAGNOSIS — K5900 Constipation, unspecified: Secondary | ICD-10-CM

## 2011-12-04 DIAGNOSIS — R110 Nausea: Secondary | ICD-10-CM

## 2011-12-04 DIAGNOSIS — R109 Unspecified abdominal pain: Secondary | ICD-10-CM

## 2011-12-04 DIAGNOSIS — K625 Hemorrhage of anus and rectum: Secondary | ICD-10-CM

## 2011-12-04 MED ORDER — DICYCLOMINE HCL 20 MG PO TABS: ORAL_TABLET | ORAL | Status: DC

## 2011-12-04 MED ORDER — HYDROCORTISONE ACETATE 25 MG RE SUPP: RECTAL | Status: DC

## 2011-12-04 MED ORDER — ALIGN PO CAPS: 1.0000 | ORAL_CAPSULE | Freq: Every day | ORAL | Status: DC

## 2011-12-04 NOTE — Patient Instructions (Addendum)
No allergy to eggs but soy causes stomach pains. We have scheduled the Capsule Endoscopy for 12-19-2011 at 8:00 AM. Come to our office on that date. Directions have been provided.  Miralax daily and adjust this for diarrhea. We have sent a prescription for suppositories to [facility].  Take the Bentyl  ( dicyclomine ) medication as directed that Dr. Juanda Chance prescribed.

## 2011-12-04 NOTE — Addendum Note (Signed)
Addended by: Daphine Deutscher on: 12/04/2011 09:47 AM   Modules accepted: Orders

## 2011-12-04 NOTE — Telephone Encounter (Signed)
Per Dr. Juanda Chance, patient can see extender this week. Spoke with patient and recommendations given. Rx sent and sample up front. Scheduled with Willette Cluster, NP today at 3:30 PM.

## 2011-12-04 NOTE — Progress Notes (Signed)
Norma Harmon [id number] 06/22/1985   HISTORY OR PRESENT ILLNESS :   Patient is a 26 year old female, status post appendectomy for acute appendicitis July 2010. Patient was seen by Dr. Juanda Chance in July 2012 for evaluation of RLQ pain and CTscan revealing mild thickening of the terminal ileum and fecalization of the small bowel loops proximal to that point. She underwent colonoscopy August 2012. There were inflammatory changes in the ileum, Ileal biopsies however were normal. Cecal biopsy revealed focal active colitis without granulomas. Random colon biopsies were normal. Patient was treated with prednisone, mesalamine and antibiotics. Her symptoms were refractory to treatment and she lost a significant amount of weight.  A small bowel follow-through suggested persistent narrowing of terminal ileum. Patient was referred for surgical resection In December 2012 and underwent a right colectomy by Dr. Daphine Deutscher. 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 Terminal ileum and cecal pathology consistent with patchy chronic minimally active colitis, no granuloma or dysplasia. Since surgery, patient's symptoms have never really resolved and they have gotten progressively worse over the last few weeks. Patient has significant nausea. She has right lower quadrant discomfort and has recently developed left upper quadrant pain. Her abdominal pain is worse with defecation and worse with meals. She's lost 8 pounds over the last month. Yesterday she began seeing blood in her stool. She has joint pain in her toes and fingers. She complains of lower extremity swelling. Patient has followed up with surgery. CT scan of abdomen and pelvis with contrast February of this year was negative for any acute findings. CT enterography of the abdomen and pelvis with contrast July 3 of this year was also negative for any acute findings or inflammatory bowel disease. She had a HIDA scan in March 2013. Her gallbladder ejection fraction was  only 4.6%. Patient is hesitant to have a cholecystectomy.Labs done some one month ago unremarkable including normal CBC, normal LFTs   Current Medications, Allergies, Past Medical History, Past Surgical History, Family History and Social History were reviewed in [employer] record.   PHYSICAL EXAMINATION : General:  Well developed  female in no acute distress Head: Normocephalic and atraumatic Eyes:  sclerae anicteric,conjunctive pink. Ears: Normal auditory acuity Neck: Supple, no masses.  Lungs: Clear throughout to auscultation Heart: Regular rate and rhythm; no murmurs heard Abdomen: Soft, nondistended, nontender. No masses or hepatomegaly noted. Normal bowel sounds Rectal: internal hemorrhoids on anoscopy Musculoskeletal: Symmetrical with no gross deformities  Skin: No lesions on visible extremities Extremities: No edema or deformities noted Neurological: Oriented x 4, grossly nonfocal Cervical Nodes:  No significant cervical adenopathy Psychological:  Alert and cooperative. Emotional.  ASSESSMENT AND PLAN :   1. RLQ pain, nausea and now with LUQ pain.  She has associated weight loss. Etiology of pain not clear. In July 2012 there was question of crohns disease based on CTscan showing inflammatory process and stricturing in RLQ. Colonoscopy with terminal ileal and cecal biopsies done August 2012. Terminal ileal biopsies were normal.  Cecal biopsies consistent with patchy chronic minimally active colitis but no granuloma or dysplasia. Patient ultimately had right hemicolectomy for persistent narrowing of terminal ileum.  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 For further evaluation patient will be scheduled for capsule endoscopy.   2. Low volume rectal bleeding, likely related to  internal hemorrhoids seen on exam. Trial of Anusol  suppositories.   3. attention deficit disorder, on medication

## 2011-12-10 NOTE — Progress Notes (Signed)
Agree with Ms. Guenther's assessment and plan. Bueford Arp E. Livan Hires, MD, FACG   

## 2011-12-16 ENCOUNTER — Ambulatory Visit (INDEPENDENT_AMBULATORY_CARE_PROVIDER_SITE_OTHER): Payer: 59 | Admitting: Internal Medicine

## 2011-12-16 ENCOUNTER — Encounter: Payer: Self-pay | Admitting: Internal Medicine

## 2011-12-16 VITALS — BP 130/80 | HR 114 | Temp 99.1°F | Wt 127.0 lb

## 2011-12-16 DIAGNOSIS — R450 Nervousness: Secondary | ICD-10-CM | POA: Insufficient documentation

## 2011-12-16 DIAGNOSIS — R5381 Other malaise: Secondary | ICD-10-CM

## 2011-12-16 DIAGNOSIS — R5383 Other fatigue: Secondary | ICD-10-CM | POA: Insufficient documentation

## 2011-12-16 DIAGNOSIS — R609 Edema, unspecified: Secondary | ICD-10-CM

## 2011-12-16 DIAGNOSIS — M2550 Pain in unspecified joint: Secondary | ICD-10-CM

## 2011-12-16 LAB — MAGNESIUM: Magnesium: 2 mg/dL (ref 1.5–2.5)

## 2011-12-16 LAB — BASIC METABOLIC PANEL
Calcium: 9.8 mg/dL (ref 8.4–10.5)
Chloride: 105 mEq/L (ref 96–112)
Creatinine, Ser: 1 mg/dL (ref 0.4–1.2)

## 2011-12-16 LAB — HEPATIC FUNCTION PANEL
Bilirubin, Direct: 0 mg/dL (ref 0.0–0.3)
Total Bilirubin: 0.5 mg/dL (ref 0.3–1.2)

## 2011-12-16 LAB — HEMOGLOBIN A1C: Hgb A1c MFr Bld: 5.4 % (ref 4.6–6.5)

## 2011-12-16 LAB — SEDIMENTATION RATE: Sed Rate: 11 mm/hr (ref 0–22)

## 2011-12-16 LAB — T4, FREE: Free T4: 1.03 ng/dL (ref 0.60–1.60)

## 2011-12-16 NOTE — Patient Instructions (Addendum)
Swelling that you were having could be from many different things fortunately many of them are not serious. We'll check labs today contact you with these results avoid excess sodium in the diet.  We'll check her thyroid levels chemistry liver.  The discoloration feet still could be from repetitive trauma time on your feet and the fact that you're on a vasoconstrictive medication. Your pulses seem good in your legs.  We'll have you followup with Dr. Tawanna Cooler regarding all of these symptoms. It doesn't really appear to be related to the abdominal pain problem.  This doesn't really collect diabetes but we can get some blood glucose readings today and hemoglobin A1c test at Dr. Tawanna Cooler seems appropriate he can order a 2 to three-hour glucose tolerance test.

## 2011-12-16 NOTE — Progress Notes (Signed)
Subjective:    Patient ID: Norma Harmon, female    DOB: [birth year], 26 y.o.   MRN: [id number]  HPI Patient comes in for an acute same day appointment visit primary care doctor is out of town. She is a number of medical issues being evaluated and on a number of medications however she got concerned and worried recently. Concern about diabetes Working in yard  And felt hungry and  Light headed   Gets shaky at times   friends  Told her Dm can cause issues and she got concerned would like her blood sugar checked.   She also is concerned because her legs have some swelling she apparently has had this before has been on hctz of fluid  Per Dr Tawanna Cooler. She's only taken it occasionally like once a week at maximal once to know why she has it. 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  After works at times  Pins and needles feeling . States that her bruits are not too tight but recently when she took him off her feet looked discolored and purplish. Saturday  Ran  lower back ( hx of surgery)  Stable  Until less exercise and less  Muscles. No increased radiation of numbness or tingling to her feet with this.  She is under evaluation after bleeding hemorrhoids and to have a capsule endoscopy soon because of recurrent persistent abdominal pain which she takes narcotics to treat.  This occurred after a bowel surgery resection. She states that Ultram doesn't help and gives her a headache. She is on B12 supplementation in the past and she had a low level last checked a month ago and it was adequate.  She is on Adderall twice a day her dose was not changed a cold and 60 mg a day.  In. She's not taking Valium at this time  Review of Systems Negative currently for chest pain shortness of breath change in exercise tolerance although always feels weak when she exercises no recent syncope bleeding except for her hemorrhoids. No vision changes. She states she  does have some stiff joints at times worries about arthritis. States she had a history of a clot in her left arm that has since gotten better was taken off aspirin recently.  Past history family history social history reviewed in the electronic medical record. Outpatient Encounter Prescriptions as of 12/16/2011  Medication Sig Dispense Refill  . amphetamine-dextroamphetamine (ADDERALL) 30 MG tablet Take 1 tablet (30 mg total) by mouth 2 (two) times daily.  60 tablet  0  . bifidobacterium infantis (ALIGN) capsule Take 1 capsule by mouth daily.  32 capsule  0  . BIOTIN 5000 PO Take 1 tablet by mouth daily.       . diazepam (VALIUM) 2 MG tablet One half to one tablet at bedtime  30 tablet  2  . dicyclomine (BENTYL) 20 MG tablet Take one tablet po BID  30 tablet  0  . etonogestrel-ethinyl estradiol (NUVARING) 0.12-0.015 MG/24HR vaginal ring Place 1 each vaginally every 28 (twenty-eight) days. Insert vaginally and leave in place for 3 consecutive weeks, then remove for 1 week.      . hydrochlorothiazide (HYDRODIURIL) 25 MG tablet Take 1 tablet (25 mg total) by mouth daily.  90 tablet  3  . HYDROcodone-acetaminophen (VICODIN ES) 7.5-750 MG per tablet One half to one tablet at bedtime  20 tablet  2  . hydrocortisone (ANUSOL-HC) 25  MG suppository Use 1 suppository rectally at bedtime for 7 days.  7 suppository  1  . multivitamin (THERAGRAN) per tablet Take 1 tablet by mouth daily.        Marland Kitchen aspirin 325 MG tablet Take 325 mg by mouth daily.      . traMADol (ULTRAM) 50 MG tablet One half to one tablet 3 times daily as needed for abdominal pain  30 tablet  1  . varenicline (CHANTIX) 0.5 MG tablet Take 1 tablet (0.5 mg total) by mouth 2 (two) times daily.  30 tablet  0       Objective:   Physical Exam BP 130/80  Pulse 114  Temp 99.1 F (37.3 C) (Oral)  Wt 127 lb (57.607 kg)  SpO2 98%  LMP 12/01/2011 Repeat BP reading 130 /80 Well-developed well-nourished healthy-appearing young adult in no acute  distress appears slightly worried  HEENT is grossly normal neck supple without masses or bruit chest clear to auscultation cardiac S1-S2 no gallops or murmurs regular rate is noted Abdomen well-healed scar mid abdomen tender in the left upper quadrant but no rebound no obvious masses noted Extremity trace to slightly more edema at most not measured but legs look of equal size there is no redness warmth or streaking. No acute joint swelling in her lower sternum a.   Neurologic appears intact DTRs are present gait appears nonantalgic    Assessment & Plan:   Concern about edema lower extremities not impressive on exam today rule out obvious causes check BMP albumin thyroid which was ordered but apparently not done last time  She has long days with her legs down suggest she try compression stockings no evidence of clotting acute process at this time.   Other current problems include chronic ongoing abdominal pain post surgery requiring narcotic medications feeling of fatigue joint pains that she feels are still and discoloration of her feet. This could have been from the amphetamines even no this isn't new as this is a vasoconstricted does not really seem like her nodes her pulses are normal today.   At this time we'll check  laboratory studies and have her followup with her primary physician Dr. Tawanna Cooler.  She will proceed with her capsule endoscopy  She has a number of questions try to answer today but suggest she's discuss more with Dr. Tawanna Cooler.  Cannot think of one simple answer to explain a number of her concerns. Doubt if she is diabetic we'll check her blood sugar today and hemoglobin A1c we'll leave it up to Dr. Tawanna Cooler about getting a glucose tolerance test.  Overall I think she's worried about a number of symptoms since she had her abdominal surgery  .    Expectant management.  Total visit > 50% spent counseling and coordinating care

## 2011-12-17 LAB — ANTI-NUCLEAR AB-TITER (ANA TITER): ANA Titer 1: NEGATIVE

## 2011-12-17 LAB — ANA: Anti Nuclear Antibody(ANA): POSITIVE — AB

## 2011-12-19 ENCOUNTER — Ambulatory Visit (INDEPENDENT_AMBULATORY_CARE_PROVIDER_SITE_OTHER): Payer: 59 | Admitting: Internal Medicine

## 2011-12-19 ENCOUNTER — Encounter: Payer: Self-pay | Admitting: Family Medicine

## 2011-12-19 ENCOUNTER — Encounter: Payer: Self-pay | Admitting: *Deleted

## 2011-12-19 DIAGNOSIS — K921 Melena: Secondary | ICD-10-CM

## 2011-12-19 DIAGNOSIS — K5289 Other specified noninfective gastroenteritis and colitis: Secondary | ICD-10-CM

## 2011-12-19 NOTE — Telephone Encounter (Signed)
error 

## 2011-12-19 NOTE — Progress Notes (Signed)
Patient arrived to have capsule endo. She had prepped and was NPO. Patient given verbal and written instructions for procedure today and verbalizes understanding. Swallowed pill without difficulty. Lot [id number] 25 Expires 2014-10

## 2011-12-23 ENCOUNTER — Encounter: Payer: Self-pay | Admitting: Internal Medicine

## 2011-12-25 ENCOUNTER — Telehealth: Payer: Self-pay | Admitting: *Deleted

## 2011-12-25 ENCOUNTER — Encounter: Payer: Self-pay | Admitting: *Deleted

## 2011-12-25 NOTE — Telephone Encounter (Signed)
Message copied by Richardson Chiquito on Thu Dec 25, 2011 12:29 PM ------      Message from: Meredith Pel      Created: Thu Dec 25, 2011 12:20 PM       Dottie, she needs the follow up because she has chronic problems. I have not looked but did she get capsule study. Unless dora saw capsule results and said no need for follow up then she needs to come to appoint. thanks      ----- Message -----         From: Richardson Chiquito, CMA         Sent: 12/25/2011  10:45 AM           To: Meredith Pel, NP            Gunnar Fusi-         You saw this patient in the office on 12/04/11... She is scheduled to see Dr Juanda Chance for follow up in September (this was scheduled prior to patient's appointment with you). However, I do not see any mention that she needs to follow up with Dr Juanda Chance in 1 month. Does she need to keep appt with Dr Juanda Chance or shall I cancel it?

## 2011-12-26 ENCOUNTER — Telehealth: Payer: Self-pay | Admitting: *Deleted

## 2011-12-26 NOTE — Telephone Encounter (Signed)
Patient reports that bentyl is not helping her abdominal pain.  Per Mike Gip PA send in ultram 50 mg.  Patient advised and she is already has some.  She is advised that Dr. Juanda Chance will review capsule next week when she comes back to the office

## 2011-12-26 NOTE — Telephone Encounter (Signed)
Left message for patient to call back  

## 2012-01-05 ENCOUNTER — Telehealth: Payer: Self-pay | Admitting: *Deleted

## 2012-01-05 NOTE — Telephone Encounter (Signed)
Patient given results. Scheduled on Wednesday at 8:00 AM.

## 2012-01-05 NOTE — Telephone Encounter (Signed)
Unable to reach patient at home number. Left a message to call me at work number.

## 2012-01-05 NOTE — Telephone Encounter (Signed)
Message copied by Daphine Deutscher on Mon Jan 05, 2012  8:14 AM ------      Message from: Hart Carwin      Created: Sat Jan 03, 2012  9:39 PM      Regarding: Dolan Amen, please, call Halena about her SBCE which shows some nonspecific inflammation in her small bowl and colon. I need to see her in the office on Wed 8.00 am, before my first pt at 8.30 oor on Tueday 8.00 am before my LEC procedure which starts at 8.30 am.

## 2012-01-07 ENCOUNTER — Other Ambulatory Visit: Payer: Self-pay | Admitting: Internal Medicine

## 2012-01-07 ENCOUNTER — Encounter: Payer: Self-pay | Admitting: Internal Medicine

## 2012-01-07 ENCOUNTER — Ambulatory Visit (INDEPENDENT_AMBULATORY_CARE_PROVIDER_SITE_OTHER): Payer: 59 | Admitting: Internal Medicine

## 2012-01-07 ENCOUNTER — Telehealth: Payer: Self-pay | Admitting: Internal Medicine

## 2012-01-07 ENCOUNTER — Other Ambulatory Visit: Payer: 59

## 2012-01-07 VITALS — BP 130/78 | HR 84 | Ht 64.5 in | Wt 124.2 lb

## 2012-01-07 DIAGNOSIS — M549 Dorsalgia, unspecified: Secondary | ICD-10-CM

## 2012-01-07 DIAGNOSIS — K5010 Crohn's disease of large intestine without complications: Secondary | ICD-10-CM

## 2012-01-07 DIAGNOSIS — R109 Unspecified abdominal pain: Secondary | ICD-10-CM

## 2012-01-07 MED ORDER — HYDROCODONE-ACETAMINOPHEN 7.5-750 MG PO TABS: ORAL_TABLET | ORAL | Status: DC

## 2012-01-07 MED ORDER — MOVIPREP 100 G PO SOLR: ORAL | Status: DC

## 2012-01-07 MED ORDER — PREDNISONE 10 MG PO TABS: ORAL_TABLET | ORAL | Status: DC

## 2012-01-07 NOTE — Progress Notes (Signed)
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 She is status post remote appendectomy in 2010 and a right hemicolectomy for presumed terminal ileal obstruction due to Crohn's disease in December 2012. The colonoscopy prior to the surgery revealed focal colitis and she had normal terminal ileal biopsies. A small bowel follow-through showed terminal ileum narrowing consistent with inflammatory bowel disease but on the exploratory laparotomy by Dr. Daphine Deutscher in December, he suspected a cecal volvulus. The pathology report showed focal colitis and ileitis. She did well after the surgery but started having recurrent right lower quadrant abdominal pain. A CT enterography in January 2013 showed no acute findings. A small bowel capsule endoscopy last month showed edema in the distal ileum but no discrete ulcerations. She is having right lower quadrant abdominal pain and mild constipation. There has been no fever. She takes hydrocodone twice a day as needed and is working at night full-time. Her labs have been normal. She continues to lose weight from an initial 135 pounds 1 year ago to 124 pounds today.   Past Medical History  Diagnosis Date  . IBS (irritable bowel syndrome)   . Ovarian cyst   . Diarrhea   . Colitis   . Family history of malignant neoplasm of gastrointestinal tract   . PONV (postoperative nausea and vomiting)   . GERD (gastroesophageal reflux disease)   . Arthritis     lumbar back  . Anxiety   . ADD (attention deficit disorder)     recently started on Adderol  . Skin cancer     abdomen and neck- states not melanoma   Past Surgical History  Procedure Date  . Back surgery     L5-S1  . Wisdom tooth extraction   . Appendectomy 10/2008  . Spine surgery 08/2005    L5 - S1 fusion  . Colon resection 04/02/2011    Procedure: COLON RESECTION  LAPAROSCOPIC;  Surgeon: [name], MD;  Location: [facility];  Service: General;  Laterality: N/A;  Laparscopic  Assisted Ileocecostomy     reports that she quit smoking about 20 months ago. Her smoking use included Cigarettes. She has a .5 pack-year smoking history. She quit smokeless tobacco use about 11 months ago. She reports that she does not drink alcohol or use illicit drugs. family history includes Breast cancer in her paternal grandmother; Cirrhosis in her maternal grandfather; Colon cancer in her paternal grandfather; Colon polyps in her paternal grandmother; Diabetes in her maternal grandmother; and Heart disease in her maternal grandmother. Allergies  Allergen Reactions  . Morphine And Related Itching and Swelling  . Soy Allergy     Abdominal pains        Review of Systems: Abdominal pain right lower quadrant. No fever. Rectal bleeding has stopped  The remainder of the 10 point ROS is negative except as outlined in H&P   Physical Exam: General appearance  Well developed, in no distress. Eyes- non icteric. HEENT nontraumatic, normocephalic. Mouth no lesions, tongue papillated, no cheilosis. Neck supple without adenopathy, thyroid not enlarged, no carotid bruits, no JVD. Lungs Clear to auscultation bilaterally. Cor normal S1, normal S2, regular rhythm, no murmur,  quiet precordium. Abdomen: Scaphoid abdomen with normal active bowel sounds. Diffusely tender most tender in right lower quadrant. No palpable mass or rebound. Rectal: Not done. Extremities no pedal edema. Skin no lesions. Neurological alert and oriented x  3. Psychological normal mood and affect.  Assessment and Plan:  Problem #1 Recurrent right lower quadrant abdominal pain shortly after right hemicolectomy for presumed Crohn's disease and terminal ileum obstruction. Her recent radiographic studies do not show obstruction but the small bowel capsule endoscopy indicates edema in the vicinity of the  anastomosis. We will start her on prednisone 30 mg daily and schedule her for a colonoscopy. We will also obtain IBD markers. She will be given refills on her hydrocodone 7.5/750 to take 1-2 times a day. She will call us in 2 weeks to update [name] on the status of the pain.  01/07/2012 Norma Harmon

## 2012-01-07 NOTE — Telephone Encounter (Signed)
Spoke with [name] at [employer] and gave a verbal order for vicodin since faxed rx was never received.

## 2012-01-07 NOTE — Patient Instructions (Addendum)
You have been scheduled for a colonoscopy with moderate sedation. Please follow written instructions given to you at your visit today.  Please pick up your prep kit at the pharmacy within the next 1-3 days. If you use inhalers (even only as needed), please bring them with you on the day of your procedure. We have sent the following medications to your pharmacy for you to pick up at your convenience: Prednisone (30 mg daily) Hydrocodone Please call back in 2 weeks with a condition update. Please ask to speak to [place]on at [phone]. Your physician has requested that you go to the basement for the following lab work before leaving today: IBD serologies CC: Dr Tawanna Cooler, Dr Baxter Kail

## 2012-01-13 ENCOUNTER — Ambulatory Visit: Payer: 59 | Admitting: Internal Medicine

## 2012-01-14 ENCOUNTER — Telehealth: Payer: Self-pay | Admitting: Internal Medicine

## 2012-01-14 NOTE — Telephone Encounter (Signed)
Spoke with Norma Harmon at [facility] [employer] and advised him that I Norma Harmon to send for IBD SGI diagnostic. Apparently, this was never done. Therefore, I have faxed Norma Harmon an okay to preform the IBD SGI Diagnostic.

## 2012-01-20 ENCOUNTER — Telehealth: Payer: Self-pay | Admitting: Internal Medicine

## 2012-01-20 NOTE — Telephone Encounter (Signed)
IBD markers were not suggestive of of IBDisease, which does not mean that she does not have it but if they were positive it would support the diagnosis. How is she doing on Prednisone 30 mg/day?? She ought to keep her appointment for colonoscopy.

## 2012-01-20 NOTE — Telephone Encounter (Signed)
Dr Juanda Chance, Dottie states you have seen the results of this test; can you advise me on what to tell the pt? Thanks.

## 2012-01-21 NOTE — Telephone Encounter (Signed)
Informed pt of lab results that were basically inconclusive and that Dr Juanda Chance feels she needs the repeat COLON. Pt has questions about the repeat COLON. She is very confused since the first bx 11/2010 from COLON showed colitis, but when she had surgery the path showed minimal ileitis. The Capsule really didn't show anything, now the IBD markers are inconclusive; she is confused about whether the COLON is necessary. Also the Prednisone is causing her feet and toes to cramp and tingle as well as swell; she is also jittery. She did not mention whether the pain is better.please advise. Thanks.

## 2012-01-21 NOTE — Telephone Encounter (Signed)
lmom for pt to call back

## 2012-01-22 ENCOUNTER — Telehealth: Payer: Self-pay | Admitting: *Deleted

## 2012-01-22 NOTE — Telephone Encounter (Signed)
Noted to chart.

## 2012-01-22 NOTE — Telephone Encounter (Signed)
Message copied by Florene Glen on Thu Jan 22, 2012 10:47 AM ------      Message from: Hart Carwin      Created: Wed Jan 21, 2012  1:35 PM       I have spoken to the pt at length. She is not sure if she is improved because she always has more pain  Before her period. She is better than when she saw [place]. She will through with the colonoscopy, We discussed possibility of remicade.      ----- Message -----         From: Linna Hoff, RN         Sent: 01/21/2012   1:11 PM           To: Hart Carwin, MD            Pt called back; can you return her call? Thanks.

## 2012-01-28 ENCOUNTER — Ambulatory Visit (AMBULATORY_SURGERY_CENTER): Payer: 59 | Admitting: Internal Medicine

## 2012-01-28 ENCOUNTER — Encounter: Payer: Self-pay | Admitting: Internal Medicine

## 2012-01-28 VITALS — BP 132/74 | HR 84 | Temp 98.2°F | Resp 17 | Ht 64.5 in | Wt 124.0 lb

## 2012-01-28 DIAGNOSIS — K5010 Crohn's disease of large intestine without complications: Secondary | ICD-10-CM

## 2012-01-28 DIAGNOSIS — D126 Benign neoplasm of colon, unspecified: Secondary | ICD-10-CM

## 2012-01-28 DIAGNOSIS — R1031 Right lower quadrant pain: Secondary | ICD-10-CM

## 2012-01-28 DIAGNOSIS — R933 Abnormal findings on diagnostic imaging of other parts of digestive tract: Secondary | ICD-10-CM

## 2012-01-28 MED ORDER — DICYCLOMINE HCL 20 MG PO TABS: 20.0000 mg | ORAL_TABLET | Freq: Two times a day (BID) | ORAL | Status: DC

## 2012-01-28 MED ORDER — SODIUM CHLORIDE 0.9 % IV SOLN: 500.0000 mL | INTRAVENOUS | Status: DC

## 2012-01-28 NOTE — Patient Instructions (Addendum)
Impressions/Recommendations:  See Dr. Delia Chimes report. Await pathology report. Continue to taper Prednisone. Bentyl 20 mg by mouth twice daily. Follow up in the office.  No repeat colonoscopy scheduled at this time.   YOU HAD AN ENDOSCOPIC PROCEDURE TODAY AT THE [facility]: Refer to the procedure report that was given to you for any specific questions about what was found during the examination.  If the procedure report does not answer your questions, please call your gastroenterologist to clarify.  If you requested that your care partner not be given the details of your procedure findings, then the procedure report has been included in a sealed envelope for you to review at your convenience later.  YOU SHOULD EXPECT: Some feelings of bloating in the abdomen. Passage of more gas than usual.  Walking can help get rid of the air that was put into your GI tract during the procedure and reduce the bloating. If you had a lower endoscopy (such as a colonoscopy or flexible sigmoidoscopy) you may notice spotting of blood in your stool or on the toilet paper. If you underwent a bowel prep for your procedure, then you may not have a normal bowel movement for a few days.  DIET: Your first meal following the procedure should be a light meal and then it is ok to progress to your normal diet.  A half-sandwich or bowl of soup is an example of a good first meal.  Heavy or fried foods are harder to digest and may make you feel nauseous or bloated.  Likewise meals heavy in dairy and vegetables can cause extra gas to form and this can also increase the bloating.  Drink plenty of fluids but you should avoid alcoholic beverages for 24 hours.  ACTIVITY: Your care partner should take you home directly after the procedure.  You should plan to take it easy, moving slowly for the rest of the day.  You can resume normal activity the day after the procedure however you should NOT DRIVE or use heavy machinery for  24 hours (because of the sedation medicines used during the test).    SYMPTOMS TO REPORT IMMEDIATELY: A gastroenterologist can be reached at any hour.  During normal business hours, 8:30 AM to 5:00 PM Monday through Friday, call [phone].  After hours and on weekends, please call the GI answering service at [phone] who will take a message and have the physician on call contact you.   Following lower endoscopy (colonoscopy or flexible sigmoidoscopy):  Excessive amounts of blood in the stool  Significant tenderness or worsening of abdominal pains  Swelling of the abdomen that is new, acute  Fever of 100F or higher   FOLLOW UP: If any biopsies were taken you will be contacted by phone or by letter within the next 1-3 weeks.  Call your gastroenterologist if you have not heard about the biopsies in 3 weeks.  Our staff will call the home number listed on your records the next business day following your procedure to check on you and address any questions or concerns that you may have at that time regarding the information given to you following your procedure. This is a courtesy call and so if there is no answer at the home number and we have not heard from you through the emergency physician on call, we will assume that you have returned to your regular daily activities without incident.  SIGNATURES/CONFIDENTIALITY: You and/or your care partner have signed paperwork which will be entered  into your electronic medical record.  These signatures attest to the fact that that the information above on your After Visit Summary has been reviewed and is understood.  Full responsibility of the confidentiality of this discharge information lies with you and/or your care-partner.

## 2012-01-28 NOTE — Progress Notes (Signed)
[facility] CRNA interviewed pt. Prior to procedure,it was determined that pt. Did not have true allergy to soy products. Pt. Agree to be sedated with propofol. Physician aware and agrees.

## 2012-01-28 NOTE — Progress Notes (Signed)
Patient did not have preoperative order for IV antibiotic SSI prophylaxis. (G8918)  Patient did not experience any of the following events: a burn prior to discharge; a fall within the facility; wrong site/side/patient/procedure/implant event; or a hospital transfer or hospital admission upon discharge from the facility. (G8907)  

## 2012-01-28 NOTE — Op Note (Signed)
[facility] [address] [place], [address]   COLONOSCOPY PROCEDURE REPORT  PATIENT: Norma Harmon, Norma Harmon  MR#: [id number] BIRTHDATE: [birth year] , 25  yrs. old GENDER: Female ENDOSCOPIST: Hart Carwin, MD REFERRED BY:  none PROCEDURE DATE:  01/28/2012 PROCEDURE:   Colonoscopy with biopsy ASA CLASS:   Class I INDICATIONS:suspected IBD, s/p right hemicolectomy 03/2011 for TI obstruction- ileitis on path report, continues RLQ abd.  pain, SBCE suspicious for inflammation in the TI. MEDICATIONS: MAC sedation, administered by CRNA and Propofol (Diprivan) 500 mg IV  DESCRIPTION OF PROCEDURE:   After the risks and benefits and of the procedure were explained, informed consent was obtained.  A digital rectal exam revealed no abnormalities of the rectum.    The LB [id number]  endoscope was introduced through the anus and advanced to the ileum .  The quality of the prep was excellent, using MoviPrep .  The instrument was then slowly withdrawn as the colon was fully examined.     COLON FINDINGS: There was evidence of a prior end-to-end ileocolonic surgical anastomosis. th anastomosis was widely patent, ,there was no inflammation on the colonic side of the anastomosis- biopsies taken. Distal ileum showed minimal nonspecific erytme but no apthous ulcers or pseudopolyps, no stricture, multiple biopsies were taken from distal ileum    Retroflexed views revealed no abnormalities.     The scope was then withdrawn from the patient and the procedure completed.  COMPLICATIONS: There were no complications. ENDOSCOPIC IMPRESSION: There was evidence of a prior ileocolonic surgical anastomosis which was widely patent- biopsies taken distal ileum showed minimal erythema in patches but no active Crohn's ulcers- biopsiea taken colon appears norma lOverall there is no convincing evidence of Crohn's disease- will await biopsies, her IBD markers are negative, will taper  off steroids pending biopsy results RECOMMENDATIONS: await pathology results continue to taper Prednisone Bentyl 20 mg po bid follow up in the office   REPEAT EXAM:  Colonoscopy. no recall at this time  cc:Luretha Murphy, MD and Roderick Pee, MD  _______________________________ eSigned:  Hart Carwin, MD 01/28/2012 4:19 PM     PATIENT NAME:  Norma Harmon, Norma Harmon MR#: [id number]

## 2012-01-29 ENCOUNTER — Telehealth: Payer: Self-pay | Admitting: *Deleted

## 2012-01-29 NOTE — Telephone Encounter (Signed)
  Follow up Call-  Call back number 01/28/2012 12/06/2010  Post procedure Call Back phone  # [phone]  Permission to leave phone message Yes -     Patient questions:  Do you have a fever, pain , or abdominal swelling? no Pain Score  0 *  Have you tolerated food without any problems? yes  Have you been able to return to your normal activities? yes  Do you have any questions about your discharge instructions: Diet   no Medications  no Follow up visit  no  Do you have questions or concerns about your Care? no  Actions: * If pain score is 4 or above: No action needed, pain <4.

## 2012-02-02 ENCOUNTER — Encounter: Payer: Self-pay | Admitting: Internal Medicine

## 2012-02-05 ENCOUNTER — Encounter: Payer: Self-pay | Admitting: *Deleted

## 2012-02-17 ENCOUNTER — Telehealth: Payer: Self-pay | Admitting: Family Medicine

## 2012-02-17 MED ORDER — AMPHETAMINE-DEXTROAMPHETAMINE 30 MG PO TABS: 30.0000 mg | ORAL_TABLET | Freq: Two times a day (BID) | ORAL | Status: DC

## 2012-02-17 NOTE — Telephone Encounter (Signed)
rx ready for pick up and patient is aware  

## 2012-02-17 NOTE — Telephone Encounter (Signed)
Pt called req refill of amphetamine-dextroamphetamine (ADDERALL) 30 MG tablet. Pt only has 1 pill left.

## 2012-02-18 ENCOUNTER — Other Ambulatory Visit: Payer: Self-pay | Admitting: Family Medicine

## 2012-02-18 ENCOUNTER — Ambulatory Visit (INDEPENDENT_AMBULATORY_CARE_PROVIDER_SITE_OTHER): Payer: 59 | Admitting: Family Medicine

## 2012-02-18 ENCOUNTER — Encounter: Payer: Self-pay | Admitting: Family Medicine

## 2012-02-18 VITALS — BP 160/98 | Temp 98.7°F | Wt 126.0 lb

## 2012-02-18 DIAGNOSIS — R109 Unspecified abdominal pain: Secondary | ICD-10-CM

## 2012-02-18 DIAGNOSIS — L910 Hypertrophic scar: Secondary | ICD-10-CM

## 2012-02-18 DIAGNOSIS — Z23 Encounter for immunization: Secondary | ICD-10-CM

## 2012-02-18 DIAGNOSIS — R609 Edema, unspecified: Secondary | ICD-10-CM

## 2012-02-18 DIAGNOSIS — N644 Mastodynia: Secondary | ICD-10-CM

## 2012-02-18 DIAGNOSIS — D239 Other benign neoplasm of skin, unspecified: Secondary | ICD-10-CM

## 2012-02-18 MED ORDER — TRIAMCINOLONE ACETONIDE 0.025 % EX OINT: TOPICAL_OINTMENT | Freq: Two times a day (BID) | CUTANEOUS | Status: DC

## 2012-02-18 NOTE — Patient Instructions (Signed)
Use small amounts of the steroid gel and a Band-Aid nightly for 3-4 weeks if this does not resolve the keloid we will then inject it  We will set you up for a pelvic ultrasound to rule out endometriosis  B. cystic lesions are consistent with fibrocystic changes remember to do a thorough breast exam monthly

## 2012-02-18 NOTE — Progress Notes (Signed)
  Subjective:    Patient ID: Norma Harmon, female    DOB: [birth year], 26 y.o.   MRN: [id number]  HPI Norma Harmon is a 26 year old single female nonsmoker who comes in today for evaluation of multiple issues  We removed a dysplastic nevus from her right upper anterior chest wall and now she's formed a keloid scar. She would like to discuss options.  She recently had a followup colonoscopy by Dr. Dickie La because of her abdominal pain which still is in the unknown etiology. Colonoscopy normal Dr. Dickie La suggested she may have endometriosis. She had a pelvic ultrasound 2008 that showed right ovarian cyst however that resolved spontaneously.  She's also had some soreness in her left lower leg for many months actually years after she had the nondisplaced hairline fracture of her tibia  She's also had some left upper anterior chest wall pain.  LMP now  She's leaving Friday to go on a cruise with her boyfriend for a week    Review of Systems    general cardiopulmonary GI GU all review of systems negative Objective:   Physical Exam Well-developed well nourished female no acute distress examination of the chest wall shows the previous scar tissue from the dysplastic nevus no we pigmentation but a keloid  Bilateral breast exam shows multiple cystic lesions throughout both breasts most prominent left breast at the 12:00 position also the 6:00 position all the 9:00 position right breast. They're soft rubbery and movable and can are consistent with the soreness that she's experienced in her chest. Abdominal exam negative except for tenderness right lower quadrant no rebound. Left lower leg normal       Assessment & Plan:  Keloid from previous mole removal triamcinolone gel apply Band-Aid each bedtime question injection if this does not work  Bilateral breast cyst reassured  Persistent abdominal pain with normal colonoscopy rule out endometriosis set her up for a pelvic ultrasound  Soreness  left leg unknown etiology

## 2012-03-08 ENCOUNTER — Other Ambulatory Visit: Payer: 59

## 2012-03-08 ENCOUNTER — Ambulatory Visit (INDEPENDENT_AMBULATORY_CARE_PROVIDER_SITE_OTHER): Payer: 59 | Admitting: Family Medicine

## 2012-03-08 ENCOUNTER — Encounter: Payer: Self-pay | Admitting: Family Medicine

## 2012-03-08 VITALS — BP 120/70 | Temp 98.6°F | Wt 131.0 lb

## 2012-03-08 DIAGNOSIS — J189 Pneumonia, unspecified organism: Secondary | ICD-10-CM

## 2012-03-08 MED ORDER — DOXYCYCLINE HYCLATE 100 MG PO TABS: 100.0000 mg | ORAL_TABLET | Freq: Two times a day (BID) | ORAL | Status: DC

## 2012-03-08 MED ORDER — HYDROCODONE-HOMATROPINE 5-1.5 MG/5ML PO SYRP: 5.0000 mL | ORAL_SOLUTION | Freq: Three times a day (TID) | ORAL | Status: DC | PRN

## 2012-03-08 NOTE — Progress Notes (Signed)
  Subjective:    Patient ID: Norma Harmon, female    DOB: 13-Mar-1986, 26 y.o.   MRN: [id number]  HPI This is a 26 year old single female smoker who comes in today for evaluation of head congestion postnasal drip and cough for one week  She's had a viral syndrome with head congestion sore throat and cough the last couple days she's to run fever chills bringing up some discolored sputum. Nonsmoker no history of previous lung disease   Review of Systems    general and review of systems negative except she's on a holding pattern for the endometriosis workup with the pelvic ultrasound Objective:   Physical Exam  Well-developed well-nourished female no acute distress HEENT negative neck was supple no adenopathy lungs are clear except for crackles right base      Assessment & Plan:  Right lower lobe pneumonia probable mycoplasma plan doxy 100 twice a day Hydromet when necessary for cough return when necessary

## 2012-03-08 NOTE — Patient Instructions (Signed)
Take the doxycycline one twice daily  Hydromet 1/2-1 teaspoon 3 times daily for cough  Drink lots of water  Return when necessary

## 2012-03-09 ENCOUNTER — Other Ambulatory Visit: Payer: 59

## 2012-03-12 ENCOUNTER — Ambulatory Visit
Admission: RE | Admit: 2012-03-12 | Discharge: 2012-03-12 | Disposition: A | Discharge status: Home / Self Care | Payer: 59 | Source: Ambulatory Visit | Attending: Family Medicine | Admitting: Family Medicine

## 2012-03-12 DIAGNOSIS — R109 Unspecified abdominal pain: Secondary | ICD-10-CM

## 2012-03-17 ENCOUNTER — Telehealth: Payer: Self-pay | Admitting: Internal Medicine

## 2012-03-17 ENCOUNTER — Ambulatory Visit: Payer: 59 | Admitting: Internal Medicine

## 2012-03-18 NOTE — Telephone Encounter (Signed)
I have spoken to the pt, she has been off steroids, her back and joints are bothering her. Her weight has stabilized at 124 -128 lbs, saw a Theatre manager today for  Ovarian cyst - started on BCP's for ? Endometriosis. i will see her prn.

## 2012-05-03 ENCOUNTER — Telehealth: Payer: Self-pay | Admitting: *Deleted

## 2012-05-03 DIAGNOSIS — M549 Dorsalgia, unspecified: Secondary | ICD-10-CM

## 2012-05-03 NOTE — Telephone Encounter (Signed)
Left message for patient to call back  

## 2012-05-03 NOTE — Telephone Encounter (Signed)
Dr Juanda Chance, patient requests refills of vicodin. Shall I continue filling monthly #60 as previously ordered at her office visit in 12/2011?

## 2012-05-03 NOTE — Telephone Encounter (Signed)
We have to start cutting back on her Vicodin since it is becoming a chronic condition. Please give #30/ month x 3 months then she wil lneed an OV for an update in her condition

## 2012-05-05 ENCOUNTER — Telehealth: Payer: Self-pay | Admitting: *Deleted

## 2012-05-05 MED ORDER — HYDROCODONE-ACETAMINOPHEN 7.5-750 MG PO TABS: ORAL_TABLET | ORAL | Status: DC

## 2012-05-05 NOTE — Telephone Encounter (Signed)
error 

## 2012-05-05 NOTE — Telephone Encounter (Deleted)
Dr Juanda Chance would like patient to have Sitz marker study with capsule being ingested 5 days following colonoscopy procedure (completed today). Therefore, patient will need to ingest capsule on Sunday 05/09/12 and should have her KUB on 05/14/12. I will contact patient with this information tomorrow after her sedation has worn completely off.

## 2012-05-05 NOTE — Telephone Encounter (Signed)
Patient actually has an upcoming appointment on 05/07/12. However, she did change it to 05/12/12 @ 8:15 am because she is on a crazy work schedule. Patient advised that we will send Vicodin #30 with 3 refills since her condition will be chronic. She verbalizes understanding.

## 2012-05-07 ENCOUNTER — Ambulatory Visit: Payer: Self-pay | Admitting: Internal Medicine

## 2012-05-12 ENCOUNTER — Ambulatory Visit: Payer: Self-pay | Admitting: Internal Medicine

## 2012-05-18 ENCOUNTER — Telehealth: Payer: Self-pay | Admitting: Family Medicine

## 2012-05-18 MED ORDER — AMPHETAMINE-DEXTROAMPHETAMINE 30 MG PO TABS: 30.0000 mg | ORAL_TABLET | Freq: Two times a day (BID) | ORAL | Status: DC

## 2012-05-18 NOTE — Telephone Encounter (Signed)
Rx ready for pick up and Left message on machine  

## 2012-05-18 NOTE — Telephone Encounter (Signed)
Pt requesting refill on    amphetamine-dextroamphetamine (ADDERALL) 30 MG tablet

## 2012-05-20 ENCOUNTER — Telehealth: Payer: Self-pay | Admitting: Internal Medicine

## 2012-05-20 NOTE — Telephone Encounter (Signed)
Message copied by Arna Snipe on Thu May 20, 2012  3:10 PM ------      Message from: Richardson Chiquito      Created: Wed May 12, 2012 12:59 PM      Regarding: FW: Is at another appointment that is running late & will not make it                   ----- Message -----         From: Hart Carwin, MD         Sent: 05/12/2012  12:36 PM           To: Richardson Chiquito, CMA      Subject: RE: Is at another appointment that is runnin#            OK to charge no show/late cancellation      ----- Message -----         From: Richardson Chiquito, CMA         Sent: 05/12/2012   8:20 AM           To: Hart Carwin, MD      Subject: FW: Is at another appointment that is runnin#            Dr Juanda Chance, do you want to charge patient late cancellation fee?      ----- Message -----         From: Arna Snipe         Sent: 05/12/2012   8:09 AM           To: Richardson Chiquito, CMA      Subject: Is at another appointment that is running la#            Rescheduled for next available 06-10-12

## 2012-06-10 ENCOUNTER — Ambulatory Visit: Payer: Self-pay | Admitting: Internal Medicine

## 2012-07-07 ENCOUNTER — Ambulatory Visit: Payer: 59 | Admitting: Family

## 2012-07-08 ENCOUNTER — Telehealth: Payer: Self-pay | Admitting: Family Medicine

## 2012-07-08 ENCOUNTER — Ambulatory Visit (INDEPENDENT_AMBULATORY_CARE_PROVIDER_SITE_OTHER): Payer: 59 | Admitting: Family Medicine

## 2012-07-08 ENCOUNTER — Encounter: Payer: Self-pay | Admitting: Family Medicine

## 2012-07-08 VITALS — BP 118/70 | HR 122 | Temp 99.1°F | Wt 129.0 lb

## 2012-07-08 DIAGNOSIS — J069 Acute upper respiratory infection, unspecified: Secondary | ICD-10-CM

## 2012-07-08 DIAGNOSIS — E162 Hypoglycemia, unspecified: Secondary | ICD-10-CM

## 2012-07-08 MED ORDER — ALBUTEROL SULFATE HFA 108 (90 BASE) MCG/ACT IN AERS: 2.0000 | INHALATION_SPRAY | Freq: Four times a day (QID) | RESPIRATORY_TRACT | Status: DC | PRN

## 2012-07-08 NOTE — Patient Instructions (Addendum)

## 2012-07-08 NOTE — Telephone Encounter (Signed)
Diabetes lab was normal.

## 2012-07-08 NOTE — Progress Notes (Signed)
Chief Complaint  Patient presents with  . lab work    wants A1c checked; at health screen for work blood sugar was low  . hard to catch breath    upper right portion of back hurting,     HPI:  Worried about diabetes: -has family hx of adult onset diabetes -also had stomach issues last year -also feels sluggish if eats a bunch of sweets -had low blood sugar at health fair and wants to check for diabetes -denies: polyuria or polydipsia -she does try to eat healthy dose not get much exercise  Shortness of breath - mild: -started about 1 week ago -feels drained after a work out -has had a cough, drainage in throat, nasal congestion, L ear pain and pressure, diarrhea  -has breast pain from time to time and told fibrocystic pain in the past -denies: fevers, chills, CP, NVD   ROS: See pertinent positives and negatives per HPI.  Past Medical History  Diagnosis Date  . IBS (irritable bowel syndrome)   . Ovarian cyst   . Diarrhea   . Colitis   . Family history of malignant neoplasm of gastrointestinal tract   . PONV (postoperative nausea and vomiting)   . GERD (gastroesophageal reflux disease)   . Arthritis     lumbar back  . Anxiety   . ADD (attention deficit disorder)     recently started on Adderol  . Skin cancer     abdomen and neck- states not melanoma    Family History  Problem Relation Age of Onset  . Colon cancer Paternal Grandfather   . Breast cancer Paternal Grandmother   . Colon polyps Paternal Grandmother   . Diabetes Maternal Grandmother   . Heart disease Maternal Grandmother   . Cirrhosis Maternal Grandfather     heavy drinker    History   Social History  . Marital Status: Divorced    Spouse Name: N/A    Number of Children: 0  . Years of Education: N/A   Occupational History  . Radio producer    Social History Main Topics  . Smoking status: Former Smoker -- 0.25 packs/day for 2 years    Types: Cigarettes    Quit date: 04/28/2010  .  Smokeless tobacco: Former Neurosurgeon    Quit date: 01/29/2011  . Alcohol Use: No     Comment: rarely  . Drug Use: No  . Sexually Active: Yes    Birth Control/ Protection: Other-see comments     Comment: nuvaring   Other Topics Concern  . None   Social History Narrative  . None    Current outpatient prescriptions:amphetamine-dextroamphetamine (ADDERALL) 30 MG tablet, Take 1 tablet (30 mg total) by mouth 2 (two) times daily., Disp: 60 tablet, Rfl: 0;  amphetamine-dextroamphetamine (ADDERALL) 30 MG tablet, Take 1 tablet (30 mg total) by mouth 2 (two) times daily., Disp: 60 tablet, Rfl: 0;  amphetamine-dextroamphetamine (ADDERALL) 30 MG tablet, Take 1 tablet (30 mg total) by mouth 2 (two) times daily., Disp: 60 tablet, Rfl: 0 BIOTIN 5000 PO, Take 1 tablet by mouth daily. , Disp: , Rfl: ;  diazepam (VALIUM) 2 MG tablet, Take 1 mg by mouth at bedtime as needed., Disp: , Rfl: ;  hydrochlorothiazide (HYDRODIURIL) 25 MG tablet, Take 25 mg by mouth daily as needed., Disp: , Rfl: ;  HYDROcodone-acetaminophen (VICODIN ES) 7.5-750 MG per tablet, Take 1 tablet by mouth twice daily as needed, Disp: 30 tablet, Rfl: 3 multivitamin (THERAGRAN) per tablet, Take 1 tablet by mouth  daily.  , Disp: , Rfl: ;  albuterol (PROVENTIL HFA;VENTOLIN HFA) 108 (90 BASE) MCG/ACT inhaler, Inhale 2 puffs into the lungs every 6 (six) hours as needed for wheezing., Disp: 1 Inhaler, Rfl: 0;  aspirin 325 MG tablet, Take 325 mg by mouth daily as needed. , Disp: , Rfl:  HYDROcodone-homatropine (HYCODAN) 5-1.5 MG/5ML syrup, Take 5 mLs by mouth every 8 (eight) hours as needed for cough., Disp: 120 mL, Rfl: 1;  hydrocortisone (ANUSOL-HC) 25 MG suppository, Use 1 suppository rectally at bedtime for 7 days. prn, Disp: , Rfl: ;  triamcinolone (KENALOG) 0.025 % ointment, Apply topically 2 (two) times daily., Disp: 30 g, Rfl: 3  EXAM:  Filed Vitals:   07/08/12 0859  BP: 118/70  Pulse: 122  Temp: 99.1 F (37.3 C)    Body mass index is 21.81  kg/(m^2).  GENERAL: vitals reviewed and listed above, alert, oriented, appears well hydrated and in no acute distress  HEENT: atraumatic, conjunttiva clear, no obvious abnormalities on inspection of external nose and ears, normal appearance of ear canals and TMs, clear nasal congestion, mild post oropharyngeal erythema with PND, no tonsillar edema or exudate, no sinus TTP  NECK: no obvious masses on inspection  LUNGS: clear to auscultation bilaterally, no wheezes, rales or rhonchi, good air movement  CV: HRRR, no peripheral edema  MS: moves all extremities without noticeable abnormality  PSYCH: pleasant and cooperative, no obvious depression or anxiety  ASSESSMENT AND PLAN:  Discussed the following assessment and plan:  Upper respiratory infection - Plan: albuterol (PROVENTIL HFA;VENTOLIN HFA) 108 (90 BASE) MCG/ACT inhaler  Hypoglycemia - Plan: Hemoglobin A1c  -explained sugar was probably low from fasting - will check a1c per her request -UR symptoms and exam more c/w viral URI with bronchial involvement then with asthma - supportive care and return precautions, follow up in 1 month with PCP  -Patient advised to return or notify a doctor immediately if symptoms worsen or persist or new concerns arise.  Patient Instructions  INSTRUCTIONS FOR UPPER RESPIRATORY INFECTION:  -plenty of rest and fluids  -nasal saline wash 2-3 times daily (use prepackaged nasal saline or bottled/distilled water if making your own)   -can use sinex nasal spray for drainage and nasal congestion - but do NOT use longer then 3-4 days  -can use tylenol or ibuprofen as directed for aches and sorethroat  -in the winter time, using a humidifier at night is helpful (please follow cleaning instructions)  -if you are taking a cough medication - use only as directed, may also try a teaspoon of honey to coat the throat and throat lozenges  -for sore throat, salt water gargles can help  -follow up if you  have fevers, facial pain, tooth pain, difficulty breathing or are worsening or not getting better in 5-7 days      KIM, HANNAH R.

## 2012-07-09 NOTE — Telephone Encounter (Signed)
Left a message for pt to return call 

## 2012-07-21 ENCOUNTER — Ambulatory Visit: Payer: Self-pay | Admitting: Internal Medicine

## 2012-07-22 ENCOUNTER — Other Ambulatory Visit: Payer: Self-pay | Admitting: Internal Medicine

## 2012-07-27 ENCOUNTER — Other Ambulatory Visit: Payer: Self-pay | Admitting: Internal Medicine

## 2012-07-31 ENCOUNTER — Other Ambulatory Visit: Payer: Self-pay | Admitting: Internal Medicine

## 2012-08-11 ENCOUNTER — Telehealth: Payer: Self-pay | Admitting: Family Medicine

## 2012-08-11 MED ORDER — AMPHETAMINE-DEXTROAMPHETAMINE 30 MG PO TABS: 30.0000 mg | ORAL_TABLET | Freq: Two times a day (BID) | ORAL | Status: DC

## 2012-08-11 NOTE — Telephone Encounter (Signed)
Patient called stating that she need a refill of her adderall 30mg 1po bid. Please assist.  °

## 2012-08-12 NOTE — Telephone Encounter (Signed)
Rx ready for pick up and patient is aware 

## 2012-08-17 ENCOUNTER — Telehealth: Payer: Self-pay | Admitting: Family Medicine

## 2012-08-17 NOTE — Telephone Encounter (Signed)
Patient Information:  Caller Name: Dimitria  Phone: [phone]  Patient: Norma Harmon, Norma Harmon  Gender: Female  DOB: 09/13/1985  Age: 27 Years  PCP: Kelle Darting [facility])  Pregnant: No  Office Follow Up:  Does the office need to follow up with this patient?: Yes  Instructions For The Office: Refuses 911 disp; requests an appt with Dr.Todd 08/18/12, but none available in epic; please call back   Symptoms  Reason For Call & Symptoms: c/o back pain for a while, but seems to have worsened today; c/o weakness/tingling to left arm; c/o left eye feeling "funny" as well; which is intermittent to how she moves her body; pulls in her back when she takes a deep breath; also being treated for ?RA, crohns or colitis  Reviewed Health History In EMR: Yes  Reviewed Medications In EMR: Yes  Reviewed Allergies In EMR: Yes  Reviewed Surgeries / Procedures: Yes  Date of Onset of Symptoms: Unknown OB / GYN:  LMP: Unknown  Guideline(s) Used:  Neurologic Deficit  Disposition Per Guideline:   Call EMS 911 Now  Reason For Disposition Reached:   New neurologic deficit that is present NOW, sudden onset of ANY of the following:   Weakness of the face, arm, or leg on one side of the body  Numbness of the face, arm, or leg on one side of the body  Loss of speech or garbled speech  Advice Given:  N/A  Patient Refused Recommendation:  Patient Will Follow Up With Office Later  would like an appt

## 2012-08-17 NOTE — Telephone Encounter (Signed)
Advised pt per Dr. Tawanna Cooler she needs to go to the ED immediately.  Pt states she will not go because she thinks this is related to muscles in her back.  She states she was evaluated by medical team at her job and vitals were within normal range.

## 2012-08-23 ENCOUNTER — Ambulatory Visit (INDEPENDENT_AMBULATORY_CARE_PROVIDER_SITE_OTHER): Payer: 59 | Admitting: Family Medicine

## 2012-08-23 ENCOUNTER — Encounter: Payer: Self-pay | Admitting: Family Medicine

## 2012-08-23 VITALS — BP 120/84 | Temp 97.6°F | Wt 131.0 lb

## 2012-08-23 DIAGNOSIS — M25519 Pain in unspecified shoulder: Secondary | ICD-10-CM

## 2012-08-23 DIAGNOSIS — M546 Pain in thoracic spine: Secondary | ICD-10-CM | POA: Insufficient documentation

## 2012-08-23 DIAGNOSIS — M25512 Pain in left shoulder: Secondary | ICD-10-CM | POA: Insufficient documentation

## 2012-08-23 MED ORDER — HYDROCODONE-ACETAMINOPHEN 7.5-300 MG PO TABS: ORAL_TABLET | ORAL | Status: DC

## 2012-08-23 MED ORDER — TRAMADOL HCL 50 MG PO TABS: ORAL_TABLET | ORAL | Status: DC

## 2012-08-23 NOTE — Patient Instructions (Signed)
4 mild to moderate pain you could take tramadol 50 mg,,,,,,, one half to one tablet twice daily  For severe pain Vicodin ES,,,,,,,,,,,, one half to one tablet at bedtime  Call Dr. Regenia Skeeter office [phone] and make an appointment to see Dr. Albertha Ghee for evaluation of pain in your left shoulder  We will also put a consult note in 3 to see Dr. Venetia Maxon for evaluation of your spine

## 2012-08-23 NOTE — Progress Notes (Signed)
  Subjective:    Patient ID: Norma Harmon, female    DOB: Mar 26, 1986, 27 y.o.   MRN: [id number]  HPI Skylene is a 27 year old female nonsmoker who comes in today for evaluation of arm pain and back pain  In 2007 she had lumbar disc surgery and a spinal fusion. Since that time she's had no lumbar pain but recently she's been having midthoracic back pain. She states the back pain is constant sometimes dull sometimes sharp. On a scale of 1-10 it can be as high as a 10. No history of trauma to her spine.  She's also been having pain in her left shoulder the comes and goes. She was very physically active as a teenager however she does not recall any trauma to her left shoulder. She did recall breaking her clavicle but she does not recall which side.  She's on BCPs no complications  She also takes her Adderall 30 mg twice a day for adult ADD  She states her abdominal pain is still unresolved   Review of Systems    review of systems otherwise negative Objective:   Physical Exam  Well-developed and nourished female no acute distress examination the spine shows a scar in the lumbar area from previous back surgery. No palpable tenderness no scoliosis of the thoracic spine. Examination the upper extremities her sensation muscle strength and reflexes are all normal. Range of motion of shoulders is normal although there is crepitance left shoulder with range of motion  Skin exam normal      Assessment & Plan:  Thoracic back pain plan neurosurgical consultation  Left shoulder pain consult with Albertha Ghee

## 2012-08-25 ENCOUNTER — Other Ambulatory Visit: Payer: Self-pay | Admitting: Family Medicine

## 2012-08-25 DIAGNOSIS — M25512 Pain in left shoulder: Secondary | ICD-10-CM

## 2012-08-25 DIAGNOSIS — M546 Pain in thoracic spine: Secondary | ICD-10-CM

## 2012-08-27 ENCOUNTER — Encounter: Payer: Self-pay | Admitting: Internal Medicine

## 2012-08-27 ENCOUNTER — Ambulatory Visit (INDEPENDENT_AMBULATORY_CARE_PROVIDER_SITE_OTHER): Payer: 59 | Admitting: Internal Medicine

## 2012-08-27 VITALS — BP 118/80 | HR 106 | Ht 65.0 in | Wt 130.0 lb

## 2012-08-27 DIAGNOSIS — K589 Irritable bowel syndrome without diarrhea: Secondary | ICD-10-CM

## 2012-08-27 DIAGNOSIS — R933 Abnormal findings on diagnostic imaging of other parts of digestive tract: Secondary | ICD-10-CM

## 2012-08-27 DIAGNOSIS — K219 Gastro-esophageal reflux disease without esophagitis: Secondary | ICD-10-CM

## 2012-08-27 MED ORDER — ESOMEPRAZOLE MAGNESIUM 40 MG PO CPDR: 40.0000 mg | DELAYED_RELEASE_CAPSULE | Freq: Every day | ORAL | Status: DC

## 2012-08-27 MED ORDER — LINACLOTIDE 290 MCG PO CAPS: 1.0000 | ORAL_CAPSULE | Freq: Every day | ORAL | Status: DC

## 2012-08-27 NOTE — Patient Instructions (Addendum)
We have sent the following medications to your pharmacy for you to pick up at your convenience: Linzess Nexium  We have given you samples of Restora. This puts good bacteria back into your colon. You should take 1 capsule by mouth once daily.  CC: Dr Shela Commons. Tawanna Cooler

## 2012-08-27 NOTE — Progress Notes (Signed)
Norma Harmon 02/19/1986 MRN [id number]  History of Present Illness:  This is a 27 year old, white female with chronic right lower quadrant abdominal pain who now comes with complaints of bloating and irregular bowel habits. The pain has not been as frequent and persistent as before. We've seen her for several years now initially for suspected Crohn's disease and abnormal appearing terminal ileum on a CT scan of the abdomen. Symptoms were suggestive of a partial small bowel obstruction which led to surgery by Dr. Daphine Deutscher. She underwent a right hemicolectomy in December 2012. The pathologic specimen showed nonspecific inflammation. She continued to have right lower quadrant abdominal pain and 6 months following the surgery underwent a small bowel capsule endoscopy showing questionable edema in the distal small bowel. He was given a trial of steroids which did not improve her abdominal pain or her well-being. She subsequently underwent a colonoscopy in October 2013. Everything including the terminal ileum looked normal as well as the anastomosis. There were normal biopsies of the terminal ileum as well. She had an abnormal HIDA scan with ejection fraction of 4.6%. Her complaint today is of bloating and constipation which causes left upper quadrant discomfort and is usually relieved by having a bowel movement. She has gone through a divorce and is working full time on night shifts. Her weight has increased from 124 to a current 131 pounds. She has a little heartburn for which she takes Prilosec without much improvement. She has been on Adderall 30 mg twice a day for ADHD as well.She has just recently divorced and uses her maiden name.   Past Medical History  Diagnosis Date  . IBS (irritable bowel syndrome)   . Ovarian cyst   . Diarrhea   . Colitis   . Family history of malignant neoplasm of gastrointestinal tract   . PONV (postoperative nausea and vomiting)   . GERD (gastroesophageal reflux disease)    . Arthritis     lumbar back  . Anxiety   . ADD (attention deficit disorder)     recently started on Adderol  . Skin cancer     abdomen and neck- states not melanoma   Past Surgical History  Procedure Laterality Date  . Wisdom tooth extraction    . Appendectomy  10/2008  . Spine surgery  08/2005    L5 - S1 fusion  . Colon resection  04/02/2011    Procedure: COLON RESECTION LAPAROSCOPIC;  Surgeon: [name], MD;  Location: [facility];  Service: General;  Laterality: N/A;  Laparscopic  Assisted Ileocecostomy     reports that she quit smoking about 2 years ago. Her smoking use included Cigarettes. She has a .5 pack-year smoking history. She quit smokeless tobacco use about 18 months ago. She reports that she does not drink alcohol or use illicit drugs. family history includes Breast cancer in her paternal grandmother; Cirrhosis in her maternal grandfather; Colon cancer in her paternal grandfather; Colon polyps in her paternal grandmother; Diabetes in her maternal grandmother; and Heart disease in her maternal grandmother. Allergies  Allergen Reactions  . Percocet (Oxycodone-Acetaminophen) Swelling  . Soy Allergy     Abdominal pains        Review of Systems: Positive for heartburn negative for dysphagia. Positive for bloating and gas  The remainder of the 10 point ROS is negative except as outlined in H&P   Physical Exam: General appearance  Well developed, in no distress. Eyes- non icteric. HEENT nontraumatic, normocephalic. Mouth no lesions, tongue papillated, no cheilosis.  Neck supple without adenopathy, thyroid not enlarged, no carotid bruits, no JVD. Lungs Clear to auscultation bilaterally. Cor normal S1, normal S2, regular rhythm, no murmur,  quiet precordium. Abdomen: Hyperactive bowel sounds. Tender in epigastrium. Normal left lower quadrant and minimal tenderness in right lower quadrant. No palpable mass Rectal: Not done Extremities no pedal edema. Skin no  lesions. Neurological alert and oriented x 3. Psychological normal mood and affect.  Assessment and Plan:  Problem #52 27 year old white female with symptoms of bloating,dyspepsia and distention suggestive for functional gastrointestinal problems. There was extensive elevation for Crohn's disease but her IBD markers and the most recent radiographic and imaging studies have been negative. At this point, I feel we are dealing with irritable bowel syndrome. We will start Linzess 290 mcg daily. Because of a prior right hemicolectomy, she is prone to bacterial overgrowth. I have given her samples of a probiotic.I think stress and ner going through a divorce at age 70 is a contributing factor in her disease.  Problem #2 Gastroesophageal reflux refractory to Prilosec. I have given her samples of Nexium 40 mg to take daily. She will continue a low roughage diet.  Problem #3 Abnormal HIDA scan with 4.6% ejection fraction. Her symptoms and exam are not consistent with biliary symptoms.   08/27/2012 [name]

## 2012-08-28 ENCOUNTER — Encounter: Payer: Self-pay | Admitting: Internal Medicine

## 2012-09-14 ENCOUNTER — Telehealth: Payer: Self-pay | Admitting: Family Medicine

## 2012-09-14 NOTE — Telephone Encounter (Signed)
Pt did not get the full spine films ordered 08/25/12. Please advise - contact pt or cancel order as appropriate.

## 2012-09-16 NOTE — Telephone Encounter (Signed)
Dr Yetta Barre is going to order a MRI.  She would liked the x-rays cancelled.   Done.

## 2012-09-21 ENCOUNTER — Other Ambulatory Visit: Payer: Self-pay | Admitting: Neurological Surgery

## 2012-09-21 DIAGNOSIS — M546 Pain in thoracic spine: Secondary | ICD-10-CM

## 2012-09-27 ENCOUNTER — Other Ambulatory Visit: Payer: Self-pay | Admitting: Family Medicine

## 2012-09-27 ENCOUNTER — Other Ambulatory Visit: Payer: 59

## 2012-09-30 ENCOUNTER — Other Ambulatory Visit: Payer: 59

## 2012-10-12 ENCOUNTER — Other Ambulatory Visit: Payer: Self-pay | Admitting: Neurological Surgery

## 2012-10-12 DIAGNOSIS — M546 Pain in thoracic spine: Secondary | ICD-10-CM

## 2012-10-12 DIAGNOSIS — M542 Cervicalgia: Secondary | ICD-10-CM

## 2012-10-13 ENCOUNTER — Ambulatory Visit (INDEPENDENT_AMBULATORY_CARE_PROVIDER_SITE_OTHER): Payer: 59 | Admitting: Family

## 2012-10-13 ENCOUNTER — Encounter: Payer: Self-pay | Admitting: Family

## 2012-10-13 ENCOUNTER — Other Ambulatory Visit (HOSPITAL_COMMUNITY)
Admission: RE | Admit: 2012-10-13 | Discharge: 2012-10-13 | Disposition: A | Discharge status: Home / Self Care | Payer: 59 | Source: Ambulatory Visit | Attending: Family | Admitting: Family

## 2012-10-13 VITALS — BP 124/68 | HR 113 | Wt 132.5 lb

## 2012-10-13 DIAGNOSIS — N760 Acute vaginitis: Secondary | ICD-10-CM | POA: Insufficient documentation

## 2012-10-13 DIAGNOSIS — Z113 Encounter for screening for infections with a predominantly sexual mode of transmission: Secondary | ICD-10-CM

## 2012-10-13 DIAGNOSIS — R300 Dysuria: Secondary | ICD-10-CM

## 2012-10-13 DIAGNOSIS — Z7251 High risk heterosexual behavior: Secondary | ICD-10-CM

## 2012-10-13 LAB — POCT URINALYSIS DIPSTICK
Bilirubin, UA: NEGATIVE
Blood, UA: NEGATIVE
Glucose, UA: NEGATIVE
Ketones, UA: NEGATIVE
Leukocytes, UA: NEGATIVE
Nitrite, UA: NEGATIVE
Protein, UA: NEGATIVE
Spec Grav, UA: 1.02
Urobilinogen, UA: NEGATIVE
pH, UA: 5

## 2012-10-13 LAB — POCT URINE PREGNANCY: Preg Test, Ur: NEGATIVE

## 2012-10-13 MED ORDER — CIPROFLOXACIN HCL 250 MG PO TABS: 250.0000 mg | ORAL_TABLET | Freq: Two times a day (BID) | ORAL | Status: DC

## 2012-10-13 NOTE — Progress Notes (Signed)
Subjective:    Patient ID: Norma Harmon, female    DOB: [birth year], 27 y.o.   MRN: [id number]  HPI  Pt is a 27 year old white female who presents to the PCP with urinary symptoms x 2 weeks. Pt states symptoms of urgency and burning with L sided back pain started 2 weeks ago at that time pain was 8/10 and were relieved by OTC AZO tablets and cranberry pills. States sx returned 3 days ago but were less severe at 5/10. Pt also reports a milky white vaginal discharge and confirms engaging in unprotected sex.  Review of Systems  Constitutional: Negative.   HENT: Negative.   Eyes: Negative.   Respiratory: Negative.   Cardiovascular: Negative.   Gastrointestinal: Negative.   Endocrine: Negative.   Genitourinary: Positive for urgency and vaginal discharge.  Musculoskeletal: Negative.   Skin: Negative.   Allergic/Immunologic: Negative.   Neurological: Negative.   Hematological: Negative.   Psychiatric/Behavioral: Negative.    Past Medical History  Diagnosis Date  . IBS (irritable bowel syndrome)   . Ovarian cyst   . Diarrhea   . Colitis   . Family history of malignant neoplasm of gastrointestinal tract   . PONV (postoperative nausea and vomiting)   . GERD (gastroesophageal reflux disease)   . Arthritis     lumbar back  . Anxiety   . ADD (attention deficit disorder)     recently started on Adderol  . Skin cancer     abdomen and neck- states not melanoma    History   Social History  . Marital Status: Divorced    Spouse Name: N/A    Number of Children: 0  . Years of Education: N/A   Occupational History  . Radio producer    Social History Main Topics  . Smoking status: Former Smoker -- 0.25 packs/day for 2 years    Types: Cigarettes    Quit date: 04/28/2010  . Smokeless tobacco: Former Neurosurgeon    Quit date: 01/29/2011  . Alcohol Use: No     Comment: rarely  . Drug Use: No  . Sexually Active: Yes    Birth Control/ Protection: Other-see comments     Comment:  nuvaring   Other Topics Concern  . Not on file   Social History Narrative  . No narrative on file    Past Surgical History  Procedure Laterality Date  . Wisdom tooth extraction    . Appendectomy  10/2008  . Spine surgery  08/2005    L5 - S1 fusion  . Colon resection  04/02/2011    Procedure: COLON RESECTION LAPAROSCOPIC;  Surgeon: [name], MD;  Location: [facility];  Service: General;  Laterality: N/A;  Laparscopic  Assisted Ileocecostomy     Family History  Problem Relation Age of Onset  . Colon cancer Paternal Grandfather   . Breast cancer Paternal Grandmother   . Colon polyps Paternal Grandmother   . Diabetes Maternal Grandmother   . Heart disease Maternal Grandmother   . Cirrhosis Maternal Grandfather     heavy drinker    Allergies  Allergen Reactions  . Percocet (Oxycodone-Acetaminophen) Swelling  . Soy Allergy     Abdominal pains    Current Outpatient Prescriptions on File Prior to Visit  Medication Sig Dispense Refill  . amphetamine-dextroamphetamine (ADDERALL) 30 MG tablet Take 1 tablet (30 mg total) by mouth 2 (two) times daily.  60 tablet  0  . amphetamine-dextroamphetamine (ADDERALL) 30 MG tablet Take 1 tablet (30 mg total)  by mouth 2 (two) times daily.  60 tablet  0  . amphetamine-dextroamphetamine (ADDERALL) 30 MG tablet Take 1 tablet (30 mg total) by mouth 2 (two) times daily.  60 tablet  0  . aspirin 325 MG tablet Take 325 mg by mouth daily as needed.       Marland Kitchen BIOTIN 5000 PO Take 1 tablet by mouth daily.       Marland Kitchen esomeprazole (NEXIUM) 40 MG capsule Take 1 capsule (40 mg total) by mouth daily before breakfast.  30 capsule  1  . Hydrocodone-Acetaminophen (VICODIN ES) 7.5-300 MG TABS One half to one tablet at bedtime when necessary for back or shoulder pain  60 each  0  . Linaclotide (LINZESS) 290 MCG CAPS Take 1 capsule by mouth daily.  30 capsule  1  . multivitamin (THERAGRAN) per tablet Take 1 tablet by mouth daily.        . norethindrone-ethinyl  estradiol (JUNEL FE,GILDESS FE,LOESTRIN FE) 1-20 MG-MCG tablet Take 1 tablet by mouth daily.      . traMADol (ULTRAM) 50 MG tablet One half to one tablet twice daily when necessary for back pain  60 tablet  2  . hydrochlorothiazide (HYDRODIURIL) 25 MG tablet Take 25 mg by mouth daily as needed.       No current facility-administered medications on file prior to visit.    BP 124/68  Pulse 113  Wt 132 lb 8 oz (60.102 kg)  BMI 22.05 kg/m2  SpO2 98%  LMP 05/28/2014chart    Objective:   Physical Exam  Constitutional: She is oriented to person, place, and time. She appears well-developed and well-nourished.  HENT:  Head: Normocephalic and atraumatic.  Eyes: Conjunctivae are normal. Pupils are equal, round, and reactive to light.  Neck: Normal range of motion.  Cardiovascular: Normal rate, regular rhythm and normal heart sounds.   Pulmonary/Chest: Effort normal and breath sounds normal.  Abdominal: Soft. Bowel sounds are normal.  Genitourinary: Vagina normal and uterus normal.  Scant amount of sanguineous discharge from cervix; cervix is red in appearance  Musculoskeletal: Normal range of motion.  Neurological: She is alert and oriented to person, place, and time.  Skin: Skin is warm and dry.          Assessment & Plan:  1. Dysuria 2. Flank Pain 3. Unprotected Sex  Pelvic exam performed; endocervical sample sent for affirm testing; Pt's urine sent for culture. Pt placed on ciprofloxacin to address urinary and vaginal symptoms. Pt to follow up with PCP if sx persist or worsen.

## 2012-10-14 ENCOUNTER — Other Ambulatory Visit: Payer: 59

## 2012-10-15 LAB — URINE CULTURE: Organism ID, Bacteria: NO GROWTH

## 2012-10-19 ENCOUNTER — Ambulatory Visit
Admission: RE | Admit: 2012-10-19 | Discharge: 2012-10-19 | Disposition: A | Discharge status: Home / Self Care | Payer: 59 | Source: Ambulatory Visit | Attending: Neurological Surgery | Admitting: Neurological Surgery

## 2012-10-19 DIAGNOSIS — M546 Pain in thoracic spine: Secondary | ICD-10-CM

## 2012-10-19 DIAGNOSIS — M542 Cervicalgia: Secondary | ICD-10-CM

## 2012-10-27 ENCOUNTER — Telehealth: Payer: Self-pay | Admitting: Family Medicine

## 2012-10-27 NOTE — Telephone Encounter (Signed)
Pt needs refill amphetamine-dextroamphetamine (ADDERALL) 30 MG tablet  1 po BID Pt has enough until  7/7

## 2012-11-01 MED ORDER — AMPHETAMINE-DEXTROAMPHETAMINE 30 MG PO TABS: 30.0000 mg | ORAL_TABLET | Freq: Two times a day (BID) | ORAL | Status: DC

## 2012-11-02 NOTE — Telephone Encounter (Signed)
Rx ready for pick up and patient is aware 

## 2012-11-10 ENCOUNTER — Other Ambulatory Visit: Payer: Self-pay | Admitting: Obstetrics and Gynecology

## 2012-11-10 DIAGNOSIS — N644 Mastodynia: Secondary | ICD-10-CM

## 2012-11-10 DIAGNOSIS — N6452 Nipple discharge: Secondary | ICD-10-CM

## 2012-11-19 ENCOUNTER — Other Ambulatory Visit: Payer: 59

## 2012-11-29 ENCOUNTER — Telehealth: Payer: Self-pay | Admitting: Family Medicine
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
## 2012-12-02 NOTE — Telephone Encounter (Signed)
Patient will fax the note from the security officer for a refill

## 2012-12-03 MED ORDER — AMPHETAMINE-DEXTROAMPHETAMINE 30 MG PO TABS: 30.0000 mg | ORAL_TABLET | Freq: Two times a day (BID) | ORAL | Status: DC

## 2012-12-03 NOTE — Telephone Encounter (Signed)
Rx ready for pick up.  Patient is aware.  Note copied into chart.

## 2013-02-03 ENCOUNTER — Telehealth: Payer: Self-pay | Admitting: Family Medicine

## 2013-02-03 MED ORDER — AMPHETAMINE-DEXTROAMPHETAMINE 30 MG PO TABS: 30.0000 mg | ORAL_TABLET | Freq: Two times a day (BID) | ORAL | Status: DC

## 2013-02-03 NOTE — Telephone Encounter (Signed)
Left message on machine for patient Rx ready for pick up 

## 2013-02-03 NOTE — Telephone Encounter (Signed)
Pt is calling to request a 3 month refill of her amphetamine-dextroamphetamine (ADDERALL) 30 MG table. Please assist.

## 2013-02-06 ENCOUNTER — Encounter: Payer: Self-pay | Admitting: Family Medicine

## 2013-02-07 ENCOUNTER — Telehealth: Payer: Self-pay | Admitting: Family Medicine

## 2013-02-07 NOTE — Telephone Encounter (Signed)
Patient calling about adderall Rx.  States had tried vyvanse last year, and had to change, so she and Dr. Tawanna Cooler chose Adderall.  States she has been researching side effects, and would like to consider switching to something else "that wont make my tummy as upset, and wont make me as jittery."  States just got a 53-month set of prescriptions from Dr. Tawanna Cooler, but has not filled one yet, and wants to know "if there are other medications we can consider."   Per Epic, has not had medication appointment > 6 months, but states has appt scheduled 02/17/13.  Wants to know if he would want her to try something else for a week or so before that appointment.  Info to office for provider review/callback.  May reach patient at [phone].  krs/can

## 2013-02-08 NOTE — Telephone Encounter (Signed)
Sent message to Mychart

## 2013-02-17 ENCOUNTER — Ambulatory Visit (INDEPENDENT_AMBULATORY_CARE_PROVIDER_SITE_OTHER): Payer: 59 | Admitting: Family Medicine

## 2013-02-17 ENCOUNTER — Encounter: Payer: Self-pay | Admitting: Family Medicine

## 2013-02-17 VITALS — BP 120/80 | Temp 97.9°F | Wt 131.0 lb

## 2013-02-17 DIAGNOSIS — M2550 Pain in unspecified joint: Secondary | ICD-10-CM | POA: Insufficient documentation

## 2013-02-17 DIAGNOSIS — R109 Unspecified abdominal pain: Secondary | ICD-10-CM

## 2013-02-17 DIAGNOSIS — D510 Vitamin B12 deficiency anemia due to intrinsic factor deficiency: Secondary | ICD-10-CM

## 2013-02-17 DIAGNOSIS — D239 Other benign neoplasm of skin, unspecified: Secondary | ICD-10-CM

## 2013-02-17 LAB — BASIC METABOLIC PANEL
BUN: 15 mg/dL (ref 6–23)
Chloride: 103 mEq/L (ref 96–112)
Creatinine, Ser: 0.9 mg/dL (ref 0.4–1.2)
GFR: 79.41 mL/min (ref >60.00)
Potassium: 4.4 mEq/L (ref 3.5–5.1)

## 2013-02-17 LAB — CBC WITH DIFFERENTIAL/PLATELET
Basophils Absolute: 0 10*3/uL (ref 0.0–0.1)
Eosinophils Absolute: 0.1 10*3/uL (ref 0.0–0.7)
Lymphocytes Relative: 33.8 % (ref 12.0–46.0)
Lymphs Abs: 1.3 10*3/uL (ref 0.7–4.0)
MCHC: 33.9 g/dL (ref 30.0–36.0)
Monocytes Relative: 10.6 % (ref 3.0–12.0)
Platelets: 209 10*3/uL (ref 150.0–400.0)
RDW: 12.4 % (ref 11.5–14.6)

## 2013-02-17 LAB — VITAMIN B12: Vitamin B-12: 304 pg/mL (ref 211–911)

## 2013-02-17 LAB — TSH: TSH: 1.13 u[IU]/mL (ref 0.35–5.50)

## 2013-02-17 LAB — RHEUMATOID FACTOR: Rhuematoid fact SerPl-aCnc: <10 IU/mL (ref <=14)

## 2013-02-17 NOTE — Progress Notes (Signed)
  Subjective:    Patient ID: Norma Harmon, female    DOB: 1985-07-07, 27 y.o.   MRN: [id number]  HPI Norma Harmon is a 27 year old nonsmoking female who comes in today for multiple issues  About 9 months ago she began having stiffness and soreness in all her joints. She said it mainly involves her feet her knees her hips and shoulders. She went to see an orthopedist and had an injection in her left shoulder that seem to help somewhat. The symptoms seem to be worse during the day and when she gets and moves around it seems to be a little better. No history of trauma. Her mother has a history of sarcoidosis  She's taking Arava 30 mg twice a day for adult ADD and hydrochlorothiazide 25 mg every morning for peripheral edema. Her legs have been swollen for the past year etiology unknown.  She's had extensive GI workup. Her celiac panel was negative however when she eats gluten she has a lot of abdominal cramps. Of gluten she said she feels much better.  She had at one point a positive ANA and a low B12 level. She was on B12 shots. Last B12 shot was a year ago   Review of Systems    review of systems otherwise negative Objective:   Physical Exam  Well-developed well-nourished female no acute distress examination of the joints and skin were all normal.  She's had a history of dysplastic nevi. Total body skin exam normal      Assessment & Plan:  Diffuse myalgias and arthralgias plan recheck labs rheumatology consult with Dr. Dareen Harmon  Normal skin exam  Intermittent abdominal discomfort question celiac disease despite normal labs  History of B12 deficiency B12 level

## 2013-02-17 NOTE — Patient Instructions (Signed)
Continue your current medications  We will get your labs today and call you on Monday with the report  We will also get you set up a consult with Dr. Malva Limes to see if we can help figure out what's wrong

## 2013-02-18 LAB — ANA: Anti Nuclear Antibody(ANA): NEGATIVE

## 2013-02-18 LAB — CYCLIC CITRUL PEPTIDE ANTIBODY, IGG: Cyclic Citrullin Peptide Ab: <2 U/mL (ref 0.0–5.0)

## 2013-02-22 ENCOUNTER — Ambulatory Visit: Payer: 59 | Admitting: Family Medicine

## 2013-02-22 ENCOUNTER — Encounter: Payer: Self-pay | Admitting: Family Medicine

## 2013-03-03 ENCOUNTER — Other Ambulatory Visit: Payer: Self-pay

## 2013-04-14 ENCOUNTER — Telehealth: Payer: Self-pay | Admitting: Family Medicine

## 2013-04-14 MED ORDER — AMPHETAMINE-DEXTROAMPHETAMINE 30 MG PO TABS: 30.0000 mg | ORAL_TABLET | Freq: Two times a day (BID) | ORAL | Status: DC

## 2013-04-14 MED ORDER — HYDROCHLOROTHIAZIDE 25 MG PO TABS: 25.0000 mg | ORAL_TABLET | Freq: Every day | ORAL | Status: DC | PRN

## 2013-04-14 NOTE — Telephone Encounter (Addendum)
Pt request refill amphetamine-dextroamphetamine (ADDERALL) 30 MG tablet  1/bid 3 mo supply  pt also would like someone to go over last labs, pt noticed whited blood count low, is there concern?

## 2013-05-07 ENCOUNTER — Ambulatory Visit (INDEPENDENT_AMBULATORY_CARE_PROVIDER_SITE_OTHER): Payer: 59 | Admitting: Family Medicine

## 2013-05-07 ENCOUNTER — Encounter: Payer: Self-pay | Admitting: Family Medicine

## 2013-05-07 VITALS — BP 142/80 | HR 102 | Temp 98.7°F | Wt 139.6 lb

## 2013-05-07 DIAGNOSIS — J0100 Acute maxillary sinusitis, unspecified: Secondary | ICD-10-CM

## 2013-05-07 MED ORDER — AMOXICILLIN-POT CLAVULANATE 875-125 MG PO TABS: 1.0000 | ORAL_TABLET | Freq: Two times a day (BID) | ORAL | Status: DC

## 2013-05-07 MED ORDER — FLUCONAZOLE 150 MG PO TABS: 150.0000 mg | ORAL_TABLET | Freq: Once | ORAL | Status: DC

## 2013-05-07 NOTE — Progress Notes (Signed)
Patient Name: Norma Harmon Date of Birth: 01/05/1986 Medical Record Number: [id number]  History of Present Illness:  Patent presents with runny nose, sneezing, cough, sore throat, malaise and minimal / low-grade fever for 3 week. Now the primary complaint has become sinus pressure and pain behind the eyes and in the max sinus and neck.  R ear pain and has had progressively had worse sinus pain and ear pain a lot. Swelling in the later of the day.  The patent denies sore throat as the primary complaint. Denies sthortness of breath/wheezing, high fever, chest pain, significant myalgia, otalgia, abdominal pain, changes in bowel or bladder.  PMH, PHS, Allergies, Problem List, Medications, Family History, and Social History have all been reviewed.  Patient Active Problem List   Diagnosis Date Noted  . Joint pain 02/17/2013  . Pernicious anemia 02/17/2013  . Thoracic back pain 08/23/2012  . Left shoulder pain 08/23/2012  . 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 Abdominal  pain, other specified site 10/24/2011  . Dysplastic nevi 08/05/2011  . Abdominal pain 06/17/2011  . ADD (attention deficit disorder with hyperactivity) 02/11/2011    Past Medical History  Diagnosis Date  . IBS (irritable bowel syndrome)   . Ovarian cyst   . Diarrhea   . Colitis   . Family history of malignant neoplasm of gastrointestinal tract   . PONV (postoperative nausea and vomiting)   . GERD (gastroesophageal reflux disease)   . Arthritis     lumbar back  . Anxiety   . ADD (attention deficit disorder)     recently started on Adderol  . Skin cancer     abdomen and neck- states not melanoma    Past Surgical History  Procedure Laterality Date  . Wisdom tooth extraction    . Appendectomy  10/2008  . Spine surgery  08/2005    L5 - S1 fusion  . Colon resection  04/02/2011    Procedure: COLON RESECTION LAPAROSCOPIC;  Surgeon: Pedro Earls, MD;  Location: [facility];  Service: General;  Laterality: N/A;   Laparscopic  Assisted Ileocecostomy     History   Social History  . Marital Status: Divorced    Spouse Name: N/A    Number of Children: 0  . Years of Education: N/A   Occupational History  . Corporate investment banker    Social History Main Topics  . Smoking status: Former Smoker -- 0.25 packs/day for 2 years    Types: Cigarettes    Quit date: 04/28/2010  . Smokeless tobacco: Former Systems developer    Quit date: 01/29/2011  . Alcohol Use: No     Comment: rarely  . Drug Use: No  . Sexual Activity: Yes    Birth Control/ Protection: Other-see comments     Comment: nuvaring   Other Topics Concern  . Not on file   Social History Narrative  . No narrative on file    Family History  Problem Relation Age of Onset  . Colon cancer Paternal Grandfather   . Breast cancer Paternal Grandmother   . Colon polyps Paternal Grandmother   . Diabetes Maternal Grandmother   . Heart disease Maternal Grandmother   . Cirrhosis Maternal Grandfather     heavy drinker    Allergies  Allergen Reactions  . Percocet [Oxycodone-Acetaminophen] Swelling  . Soy Allergy     Abdominal pains    Medication list reviewed and updated in full in [place].  Review of Systems: as above, eating and drinking - tolerating PO.  Urinating normally. No excessive vomitting or diarrhea. O/w as above.  Physical Exam:  Filed Vitals:   05/07/13 1036  BP: 142/80  Pulse: 102  Temp: 98.7 F (37.1 C)  TempSrc: Oral  Weight: 139 lb 9.6 oz (63.322 kg)  SpO2: 99%    GEN: WDWN, Non-toxic, Atraumatic, normocephalic. A and O x 3. HEENT: Oropharynx clear without exudate, MMM, no significant LAD, mild rhinnorhea Sinuses: Right Frontal, ethmoid, and maxillary: Tender - exquisitely at the max sinus Left Frontal, Ethmoid, and maxillary: non-Tender Ears: TM clear, COL visualized with good landmarks CV: RRR, no m/g/r. Pulm: CTA B, no wheezes, rhonchi, or crackles, normal respiratory effort. EXT: no c/c/e Psych: well  oriented, neither depressed nor anxious in appearance  Assessment and Plan: Acute Sinusitis - fairly severe max  Acute sinusitis: ABX as below with augmentin Reviewed symptomatic care as well as ABX in this case.    Meds ordered this encounter  Medications  . predniSONE (DELTASONE) 10 MG tablet    Sig: Take 15 mg by mouth daily with breakfast.  . amoxicillin-clavulanate (AUGMENTIN) 875-125 MG per tablet    Sig: Take 1 tablet by mouth 2 (two) times daily.    Dispense:  20 tablet    Refill:  0  . fluconazole (DIFLUCAN) 150 MG tablet    Sig: Take 1 tablet (150 mg total) by mouth once.    Dispense:  1 tablet    Refill:  0    Patient Instructions Reviewed: SINUSITIS Sinuses are cavities in facial skeleton that drain to nose. Impaired drainage and obstruction of sinus passages main cause.  Treatment: 1. Take all Antibiotics 2. Open nasal and sinus canals: Oral decongestant: Sudafed. (CAUTION IF HIGH BLOOD PRESSURE) 3. Steam inhalation 4. Humidifier in room 5. Frequent nasal saline irrigation 6. Moist heat compresses to face 7. Tylenol or Ibuprofen for pain and fever, follow directions on bottle.   Signed,  Maud Deed. Gualberto Wahlen, MD, [place] at [facility] [place] [address] Phone: [phone] Fax: [phone]

## 2013-05-07 NOTE — Progress Notes (Signed)
Pre-visit discussion using our clinic review tool. No additional management support is needed unless otherwise documented below in the visit note.  

## 2013-05-30 ENCOUNTER — Telehealth: Payer: Self-pay | Admitting: Family Medicine

## 2013-05-30 NOTE — Telephone Encounter (Signed)
Please advise 

## 2013-05-30 NOTE — Telephone Encounter (Signed)
Spoke with patient and she will call in the morning if no improvement to schedule an appointment

## 2013-05-30 NOTE — Telephone Encounter (Signed)
Patient Information:  Caller Name: Norma Harmon  Phone: [phone]  Patient: Norma Harmon, Norma Harmon  Gender: Female  DOB: Oct 24, 1985  Age: 28 Years  PCP: Stevie Kern [facility])  Pregnant: No  Office Follow Up:  Does the office need to follow up with this patient?: Yes  Instructions For The Office: Please advice if there are any appointments today, 2/2/2/105 at the [facility] office. She has had low grade fevers in 99 range. Sinus infection follow up and has not gone away despite taking 10 days of antibiotics. Above eye pain is severe, right ear pain and drainage large and filled with bloody clots. She has tried the [employer] pot, saline washes and no relief. She was requesting medication be called in and Rn/CAN advised office appointment based on right ear pain and blood clots from nasal drainage and ongoing should pain issues that she also wanted to discuss pain medication options. She refused office appointment today, 05/30/2013 at [place] and is interested in the [facility] office.   Symptoms  Reason For Call & Symptoms: 10 day antibiotic for sinus finished and Sinus issue remains and blood clots.  Reviewed Health History In EMR: Yes  Reviewed Medications In EMR: Yes  Reviewed Allergies In EMR: Yes  Reviewed Surgeries / Procedures: Yes  Date of Onset of Symptoms: 05/27/2013 OB / GYN:  LMP: 05/25/2013  Guideline(s) Used:  Sinus Pain and Congestion  Disposition Per Guideline:   Go to Office Now  Reason For Disposition Reached:   Severe sinus pain  Advice Given:  For a Stuffy Nose - Use Nasal Washes:  Introduction: Saline (salt water) nasal irrigation (nasal wash) is an effective and simple home remedy for treating stuffy nose and sinus congestion. The nose can be irrigated by pouring, spraying, or squirting salt water into the nose and then letting it run back out.  How it Helps: The salt water rinses out excess mucus, washes out any irritants (dust, allergens) that might be  present, and moistens the nasal cavity.  RN Overrode Recommendation:  Follow Up With Office Later  Patient requests to wait until tomorrow, 05/31/2013 and go to the [facility] office.

## 2013-06-01 ENCOUNTER — Ambulatory Visit (INDEPENDENT_AMBULATORY_CARE_PROVIDER_SITE_OTHER): Payer: 59 | Admitting: Family Medicine

## 2013-06-01 ENCOUNTER — Encounter: Payer: Self-pay | Admitting: Family Medicine

## 2013-06-01 VITALS — BP 122/72 | HR 96 | Temp 98.1°F | Wt 142.0 lb

## 2013-06-01 DIAGNOSIS — M940 Chondrocostal junction syndrome [Tietze]: Secondary | ICD-10-CM | POA: Insufficient documentation

## 2013-06-01 DIAGNOSIS — J329 Chronic sinusitis, unspecified: Secondary | ICD-10-CM

## 2013-06-01 MED ORDER — MELOXICAM 15 MG PO TABS: 15.0000 mg | ORAL_TABLET | Freq: Every day | ORAL | Status: DC

## 2013-06-01 MED ORDER — AMOXICILLIN-POT CLAVULANATE 875-125 MG PO TABS: 1.0000 | ORAL_TABLET | Freq: Two times a day (BID) | ORAL | Status: DC

## 2013-06-01 MED ORDER — FLUTICASONE PROPIONATE 50 MCG/ACT NA SUSP: 2.0000 | Freq: Every day | NASAL | Status: DC

## 2013-06-01 NOTE — Assessment & Plan Note (Signed)
Home stretches, antiinflammatory, heat.

## 2013-06-01 NOTE — Progress Notes (Signed)
Pre-visit discussion using our clinic review tool. No additional management support is needed unless otherwise documented below in the visit note.  

## 2013-06-01 NOTE — Assessment & Plan Note (Signed)
Treat with 30 days of antibiotics.  If not improved at that time consider sinus CT anfd ENT referral.

## 2013-06-01 NOTE — Progress Notes (Signed)
Subjective:    Patient ID: Norma Harmon, female    DOB: 1986-04-22, 28 y.o.   MRN: [id number]  HPI Comments: She has been having issues with sinuses in lastr 4-5 months. Last month took augmentin, she felt a small bit better but then it all returned 1 week after completeing antibiotics.  She is now in last month  also having burning from right lower ear into right upper shoulder. She recently had heard some popping when she pushes on anterior chest wall on right.  Sore on right chest wall to palpation now... Pain radiates to back. Some pain with ROM with shoulder.  No known shoulder injury. She states she did have improvement in pain with antibiotics.  Otalgia  There is pain in the right ear. The problem occurs constantly. The problem has been gradually worsening. Maximum temperature: low grade occ. The fever has been present for 3 to 4 days. The pain is severe. Associated symptoms include coughing, headaches and neck pain. Pertinent negatives include no abdominal pain, rash, rhinorrhea, sore throat or vomiting. Associated symptoms comments: Dizzy, nauseous  nasal congestion.Nyoka Cowden discharge, bloody. She has tried acetaminophen (antibitoics, netty pot, excedrine, afrin occ, mucinex, dayquil.) for the symptoms. The treatment provided mild relief. There is no history of a chronic ear infection, hearing loss or a tympanostomy tube.      Review of Systems  HENT: Positive for ear pain. Negative for rhinorrhea and sore throat.   Respiratory: Positive for cough. Negative for shortness of breath and wheezing.        Mild DOE   Cardiovascular: Negative for chest pain.  Gastrointestinal: Negative for vomiting and abdominal pain.  Musculoskeletal: Positive for neck pain.  Skin: Negative for rash.  Neurological: Positive for headaches.       Objective:   Physical Exam  Constitutional: Vital signs are normal. She appears well-developed and well-nourished. She is cooperative.  Non-toxic  appearance. She does not appear ill. No distress.  HENT:  Head: Normocephalic.  Right Ear: Hearing, external ear and ear canal normal. Tympanic membrane is not erythematous, not retracted and not bulging. A middle ear effusion is present.  Left Ear: Hearing, external ear and ear canal normal. Tympanic membrane is not erythematous, not retracted and not bulging. A middle ear effusion is present.  Nose: Right sinus exhibits maxillary sinus tenderness and frontal sinus tenderness. Left sinus exhibits no maxillary sinus tenderness and no frontal sinus tenderness.  Mouth/Throat: Uvula is midline, oropharynx is clear and moist and mucous membranes are normal.  Eyes: Conjunctivae, EOM and lids are normal. Pupils are equal, round, and reactive to light. Lids are everted and swept, no foreign bodies found.  Neck: Trachea normal and normal range of motion. Neck supple. Carotid bruit is not present. No mass and no thyromegaly present.  Cardiovascular: Normal rate, regular rhythm, S1 normal, S2 normal, normal heart sounds, intact distal pulses and normal pulses.  Exam reveals no gallop and no friction rub.   No murmur heard. Pulmonary/Chest: Effort normal and breath sounds normal. Not tachypneic. No respiratory distress. She has no decreased breath sounds. She has no wheezes. She has no rhonchi. She has no rales. Chest wall is not dull to percussion. She exhibits tenderness and bony tenderness. She exhibits no mass.    Abdominal: Normal appearance. She exhibits no fluid wave and no abdominal bruit. There is no hepatosplenomegaly. There is no CVA tenderness. No hernia.  Musculoskeletal:       Right shoulder: She exhibits normal  range of motion, no tenderness and no bony tenderness.       Cervical back: She exhibits tenderness. She exhibits normal range of motion and no bony tenderness.       Back:  Lymphadenopathy:    She has no cervical adenopathy.    She has no axillary adenopathy.  Neurological: She is  alert. She has normal strength. No cranial nerve deficit or sensory deficit.  Skin: Skin is warm, dry and intact. No rash noted.  Psychiatric: Her speech is normal and behavior is normal. Judgment normal. Her mood appears not anxious. Cognition and memory are normal. She does not exhibit a depressed mood.          Assessment & Plan:

## 2013-06-01 NOTE — Patient Instructions (Addendum)
Start nasal steroid spray, fluticasone 2 sprays per nostril daily. Complete 30 day course of antibiotics for chronic sinus infection.. Call if not improved at end of course. For costochondritis: start meloxicam for pain and inflammation, gentle chest wall stretches and upper back stretches.   Costochondritis Costochondritis, sometimes called Tietze syndrome, is a swelling and irritation (inflammation) of the tissue (cartilage) that connects your ribs with your breastbone (sternum). It causes pain in the chest and rib area. Costochondritis usually goes away on its own over time. It can take up to 6 weeks or longer to get better, especially if you are unable to limit your activities. CAUSES  Some cases of costochondritis have no known cause. Possible causes include:  Injury (trauma).  Exercise or activity such as lifting.  Severe coughing. SIGNS AND SYMPTOMS  Pain and tenderness in the chest and rib area.  Pain that gets worse when coughing or taking deep breaths.  Pain that gets worse with specific movements. DIAGNOSIS  Your health care provider will do a physical exam and ask about your symptoms. Chest X-rays or other tests may be done to rule out other problems. TREATMENT  Costochondritis usually goes away on its own over time. Your health care provider may prescribe medicine to help relieve pain. HOME CARE INSTRUCTIONS   Avoid exhausting physical activity. Try not to strain your ribs during normal activity. This would include any activities using chest, abdominal, and side muscles, especially if heavy weights are used.  Apply ice to the affected area for the first 2 days after the pain begins.  Put ice in a plastic bag.  Place a towel between your skin and the bag.  Leave the ice on for 20 minutes, 2 3 times a day.  Only take over-the-counter or prescription medicines as directed by your health care provider. SEEK MEDICAL CARE IF:  You have redness or swelling at the rib  joints. These are signs of infection.  Your pain does not go away despite rest or medicine. SEEK IMMEDIATE MEDICAL CARE IF:   Your pain increases or you are very uncomfortable.  You have shortness of breath or difficulty breathing.  You cough up blood.  You have worse chest pains, sweating, or vomiting.  You have a fever or persistent symptoms for more than 2 3 days.  You have a fever and your symptoms suddenly get worse. MAKE SURE YOU:   Understand these instructions.  Will watch your condition.  Will get help right away if you are not doing well or get worse. Document Released: 01/22/2005 Document Revised: 02/02/2013 Document Reviewed: 11/16/2012 [facility] Patient Information 2014 [place].

## 2013-06-03 ENCOUNTER — Other Ambulatory Visit: Payer: Self-pay

## 2013-06-03 MED ORDER — FLUCONAZOLE 150 MG PO TABS: 150.0000 mg | ORAL_TABLET | Freq: Once | ORAL | Status: DC

## 2013-06-03 NOTE — Telephone Encounter (Addendum)
Pt left v/m was seen 06/01/13 and was prescribed Augmentin; in the past when pt takes Augmentin pt gets yeast infection and pt request diflucan called in so when pt gets yeast infection she will have medication on hand. Pt wants to have for weekend if needed.pt is having some perineal itching and is on menstrual period so not sure if any vaginal discharge or not. Please advise. [facility].

## 2013-06-03 NOTE — Telephone Encounter (Signed)
Prescription for Diflucan sent to [facility].  Patient notified prescription has been sent to her pharmacy.

## 2013-06-16 ENCOUNTER — Telehealth: Payer: Self-pay | Admitting: Family Medicine

## 2013-06-16 NOTE — Telephone Encounter (Signed)
Patient will call insurance and find out what medication they will cover other than Adderall.

## 2013-06-16 NOTE — Telephone Encounter (Signed)
Pt is requesting a refill of amphetamine-dextroamphetamine (ADDERALL) 30 MG tablet and her birth control. Patient also states that she thinks her insurance does not cover her generic adderall.  Pt wants to know can something else be called in. Please advise.

## 2013-06-16 NOTE — Telephone Encounter (Signed)
Dexedrine is covered by insurance.  Will also call [facility] to find out price of Vyvanse.

## 2013-06-17 MED ORDER — AMPHETAMINE-DEXTROAMPHETAMINE 30 MG PO TABS: 30.0000 mg | ORAL_TABLET | Freq: Two times a day (BID) | ORAL | Status: DC

## 2013-06-17 NOTE — Telephone Encounter (Signed)
Rx for generic adderall 30 #30 at [facility] is $25.84.  Patient will get a 30 day of adderall while waiting on reply from Dr Sherren Mocha for rx Dexedrine

## 2013-06-22 NOTE — Telephone Encounter (Signed)
Okay per Dr Sherren Mocha to fill Rx for Dexedrine.  Will print when Dr Sherren Mocha returns to the office.

## 2013-07-11 ENCOUNTER — Encounter: Payer: Self-pay | Admitting: *Deleted

## 2013-07-12 ENCOUNTER — Ambulatory Visit (INDEPENDENT_AMBULATORY_CARE_PROVIDER_SITE_OTHER): Payer: 59 | Admitting: Family Medicine

## 2013-07-12 ENCOUNTER — Ambulatory Visit (INDEPENDENT_AMBULATORY_CARE_PROVIDER_SITE_OTHER)
Admission: RE | Admit: 2013-07-12 | Discharge: 2013-07-12 | Disposition: A | Discharge status: Home / Self Care | Payer: 59 | Source: Ambulatory Visit | Attending: Family Medicine | Admitting: Family Medicine

## 2013-07-12 ENCOUNTER — Encounter: Payer: Self-pay | Admitting: Family Medicine

## 2013-07-12 VITALS — BP 140/98 | Temp 98.6°F | Wt 139.0 lb

## 2013-07-12 DIAGNOSIS — J329 Chronic sinusitis, unspecified: Secondary | ICD-10-CM

## 2013-07-12 DIAGNOSIS — F988 Other specified behavioral and emotional disorders with onset usually occurring in childhood and adolescence: Secondary | ICD-10-CM

## 2013-07-12 DIAGNOSIS — M546 Pain in thoracic spine: Secondary | ICD-10-CM

## 2013-07-12 DIAGNOSIS — G43909 Migraine, unspecified, not intractable, without status migrainosus: Secondary | ICD-10-CM | POA: Insufficient documentation

## 2013-07-12 MED ORDER — AMPHETAMINE-DEXTROAMPHETAMINE 30 MG PO TABS: 30.0000 mg | ORAL_TABLET | Freq: Two times a day (BID) | ORAL | Status: DC

## 2013-07-12 MED ORDER — AMPHETAMINE-DEXTROAMPHET ER 30 MG PO CP24: 30.0000 mg | ORAL_CAPSULE | ORAL | Status: DC

## 2013-07-12 MED ORDER — TRAMADOL HCL 50 MG PO TABS: 50.0000 mg | ORAL_TABLET | Freq: Three times a day (TID) | ORAL | Status: DC | PRN

## 2013-07-12 NOTE — Progress Notes (Signed)
Pre visit review using our clinic review tool, if applicable. No additional management support is needed unless otherwise documented below in the visit note. 

## 2013-07-12 NOTE — Patient Instructions (Signed)
Tramadol 50 mg.......... one half to one tablet 3 times daily. For pain  Call Dr. Ronnald Ramp for evaluation ASAP  Call Dr. Radene Journey ear nose and throat for consultation concerning her sinuses

## 2013-07-12 NOTE — Progress Notes (Signed)
   Subjective:    Patient ID: Norma Harmon, female    DOB: [birth year], 28 y.o.   MRN: [id number]  HPI Norma Harmon is a 28 year old female who comes in today for evaluation of multiple issues  We referred her to neurology last year for evaluation of back pain. She was found to have a cervical and lumbar bulging disc was treated with physical therapy the pain diminished never went away. Over the past 3 months gotten worse  She's had allergic rhinitis for 6 months. She was seen at the Saturday clinic and given a 10 days of antibiotics and then she was seen in the [facility] clinic and given a month's worth of antibiotics without any diagnostic studies. She recently went to her dentist to do some x-rays and told her she had sinus disease.  She also thinks sinus problems trigger migraines   Review of Systems    review of systems negative Objective:   Physical Exam Well-developed well nourished female no acute distress vital signs stable she's afebrile HEENT negative neck was supple no adenopathy nose was normal except in the midline 2+ edema       Assessment & Plan:  Question sinusitis...........Marland Kitchen workup with x-rays........ D. is is to me however we don't have a fissure radiology reading. I will call when I get the report  .

## 2013-07-13 ENCOUNTER — Telehealth: Payer: Self-pay | Admitting: Family Medicine

## 2013-07-13 NOTE — Telephone Encounter (Signed)
Pt was seen yesterday and dr. Sherren Mocha provided her an rx traMADol (ULTRAM) 50 MG tablet, pt [facility] it states that this meds interacts with adderall, pt wants to know if she should continue to take it.

## 2013-07-14 NOTE — Telephone Encounter (Signed)
Okay per Dr Todd and patient is aware. 

## 2013-07-27 ENCOUNTER — Telehealth: Payer: Self-pay | Admitting: Family Medicine

## 2013-07-27 NOTE — Telephone Encounter (Signed)
Pt was seen 2 weeks ago, and dr. Sherren Mocha had suggested that she see her neurologist , pt states she can not get an appointment with the neurologist until 4/14. Pt states she is in a lot of pain and the tramadol is not working at all. Wants to know if what other alternatives she has, she states her pain is unbearable.

## 2013-08-02 ENCOUNTER — Encounter: Payer: Self-pay | Admitting: Family Medicine

## 2013-08-04 MED ORDER — HYDROCODONE-ACETAMINOPHEN 5-325 MG PO TABS: 1.0000 | ORAL_TABLET | Freq: Four times a day (QID) | ORAL | Status: DC | PRN

## 2013-08-04 NOTE — Telephone Encounter (Signed)
Rx okay per Dr Sherren Mocha.  No refills.  Patient is aware and rx ready for pick up

## 2013-08-08 ENCOUNTER — Emergency Department (HOSPITAL_COMMUNITY)
Admission: EM | Admit: 2013-08-08 | Discharge: 2013-08-08 | Disposition: A | Discharge status: Home / Self Care | Payer: Worker's Compensation | Attending: Emergency Medicine | Admitting: Emergency Medicine

## 2013-08-08 ENCOUNTER — Emergency Department (HOSPITAL_COMMUNITY): Payer: Worker's Compensation

## 2013-08-08 ENCOUNTER — Encounter (HOSPITAL_COMMUNITY): Payer: Self-pay | Admitting: Emergency Medicine

## 2013-08-08 DIAGNOSIS — Y990 Civilian activity done for income or pay: Secondary | ICD-10-CM | POA: Insufficient documentation

## 2013-08-08 DIAGNOSIS — Y9289 Other specified places as the place of occurrence of the external cause: Secondary | ICD-10-CM | POA: Insufficient documentation

## 2013-08-08 DIAGNOSIS — Y9389 Activity, other specified: Secondary | ICD-10-CM | POA: Insufficient documentation

## 2013-08-08 DIAGNOSIS — IMO0002 Reserved for concepts with insufficient information to code with codable children: Secondary | ICD-10-CM | POA: Insufficient documentation

## 2013-08-08 DIAGNOSIS — Z79899 Other long term (current) drug therapy: Secondary | ICD-10-CM | POA: Insufficient documentation

## 2013-08-08 DIAGNOSIS — Z23 Encounter for immunization: Secondary | ICD-10-CM | POA: Insufficient documentation

## 2013-08-08 DIAGNOSIS — S6292XA Unspecified fracture of left wrist and hand, initial encounter for closed fracture: Secondary | ICD-10-CM

## 2013-08-08 DIAGNOSIS — M129 Arthropathy, unspecified: Secondary | ICD-10-CM | POA: Insufficient documentation

## 2013-08-08 DIAGNOSIS — Z87891 Personal history of nicotine dependence: Secondary | ICD-10-CM | POA: Insufficient documentation

## 2013-08-08 DIAGNOSIS — W230XXA Caught, crushed, jammed, or pinched between moving objects, initial encounter: Secondary | ICD-10-CM | POA: Insufficient documentation

## 2013-08-08 DIAGNOSIS — Z85828 Personal history of other malignant neoplasm of skin: Secondary | ICD-10-CM | POA: Insufficient documentation

## 2013-08-08 LAB — RAPID URINE DRUG SCREEN, HOSP PERFORMED
Amphetamines: POSITIVE — AB
BENZODIAZEPINES: NOT DETECTED
Barbiturates: NOT DETECTED
Cocaine: NOT DETECTED
Opiates: POSITIVE — AB
Tetrahydrocannabinol: NOT DETECTED

## 2013-08-08 MED ORDER — DIPHENHYDRAMINE HCL 50 MG/ML IJ SOLN
25.0000 mg | Freq: Once | INTRAMUSCULAR | Status: AC
  Administered 2013-08-08: 25 mg via INTRAVENOUS
  Filled 2013-08-08: qty 1

## 2013-08-08 MED ORDER — HYDROMORPHONE HCL PF 1 MG/ML IJ SOLN
1.0000 mg | Freq: Once | INTRAMUSCULAR | Status: AC
  Administered 2013-08-08: 1 mg via INTRAVENOUS
  Filled 2013-08-08: qty 1

## 2013-08-08 MED ORDER — HYDROCODONE-ACETAMINOPHEN 5-325 MG PO TABS: 1.0000 | ORAL_TABLET | ORAL | Status: DC | PRN

## 2013-08-08 MED ORDER — ONDANSETRON HCL 4 MG/2ML IJ SOLN
4.0000 mg | Freq: Once | INTRAMUSCULAR | Status: AC
  Administered 2013-08-08: 4 mg via INTRAVENOUS
  Filled 2013-08-08: qty 2

## 2013-08-08 MED ORDER — TETANUS-DIPHTH-ACELL PERTUSSIS 5-2.5-18.5 LF-MCG/0.5 IM SUSP
0.5000 mL | Freq: Once | INTRAMUSCULAR | Status: AC
  Administered 2013-08-08: 0.5 mL via INTRAMUSCULAR
  Filled 2013-08-08: qty 0.5

## 2013-08-08 NOTE — ED Notes (Signed)
Pt has been transported out of the department for tests

## 2013-08-08 NOTE — ED Notes (Signed)
Bedside commode placed at bedside for pt to use; family at bedside

## 2013-08-08 NOTE — Progress Notes (Signed)
Orthopedic Tech Progress Note Patient Details:  Norma Harmon [birth year] [id number] Short arm splint applied to Left UE; dorsal volar with thumb spica to enclose and protect all fingers as comfortably as possible until patient is seen by hand specialist in the morning. Patient tolerated application well. Arm sling applied to Left UE as well.  Ortho Devices Type of Ortho Device: Other (comment);Short arm splint;Arm sling Ortho Device/Splint Location: Left UE Ortho Device/Splint Interventions: Application   Asia R Thompson 08/08/2013, 3:55 PM

## 2013-08-08 NOTE — ED Notes (Signed)
Pt moved from room D32 to room D34; family in tow; pt placed on continuous pulse oximetry and blood pressure cuff

## 2013-08-08 NOTE — Discharge Instructions (Signed)
Cast or Splint Care Casts and splints support injured limbs and keep bones from moving while they heal.  HOME CARE  Keep the cast or splint uncovered during the drying period.  A plaster cast can take 24 to 48 hours to dry.  A fiberglass cast will dry in less than 1 hour.  Do not rest the cast on anything harder than a pillow for 24 hours.  Do not put weight on your injured limb. Do not put pressure on the cast. Wait for your doctor's approval.  Keep the cast or splint dry.  Cover the cast or splint with a plastic bag during baths or wet weather.  If you have a cast over your chest and belly (trunk), take sponge baths until the cast is taken off.  If your cast gets wet, dry it with a towel or blow dryer. Use the cool setting on the blow dryer.  Keep your cast or splint clean. Wash a dirty cast with a damp cloth.  Do not put any objects under your cast or splint.  Do not scratch the skin under the cast with an object. If itching is a problem, use a blow dryer on a cool setting over the itchy area.  Do not trim or cut your cast.  Do not take out the padding from inside your cast.  Exercise your joints near the cast as told by your doctor.  Raise (elevate) your injured limb on 1 or 2 pillows for the first 1 to 3 days. GET HELP IF:  Your cast or splint cracks.  Your cast or splint is too tight or too loose.  You itch badly under the cast.  Your cast gets wet or has a soft spot.  You have a bad smell coming from the cast.  You get an object stuck under the cast.  Your skin around the cast becomes red or sore.  You have new or more pain after the cast is put on. GET HELP RIGHT AWAY IF:  You have fluid leaking through the cast.  You cannot move your fingers or toes.  Your fingers or toes turn blue or white or are cool, painful, or puffy (swollen).  You have tingling or lose feeling (numbness) around the injured area.  You have bad pain or pressure under the  cast.  You have trouble breathing or have shortness of breath.  You have chest pain. Document Released: 08/14/2010 Document Revised: 12/15/2012 Document Reviewed: 10/21/2012 [facility] Patient Information 2014 [place]. Hand Fracture Your caregiver has diagnosed you with a fractured (broken) bone in your hand. If the bones are in good position and the hand is properly immobilized and rested, these injuries will usually heal in 3 to 6 weeks. A cast, splint, or bulky bandage is usually applied to keep the fracture site from moving. Do not remove the splint or cast until your caregiver approves. If the fracture is unstable or the bones are not aligned properly, surgery may be needed. Keep your hand raised (elevated) above the level of your heart as much as possible for the next 2 to 3 days until the swelling and pain are better. Apply ice packs for 15-20 minutes every 3 to 4 hours to help control the pain and swelling. See your caregiver or an orthopedic specialist as directed for follow-up care to make sure the fracture is beginning to heal properly. SEEK IMMEDIATE MEDICAL CARE IF:   You notice your fingers are cold, numb, crooked, or the pain of  your injury is severe.  You are not improving or seem to be getting worse.  You have questions or concerns. Document Released: 05/22/2004 Document Revised: 07/07/2011 Document Reviewed: 10/10/2008 [facility] Patient Information 2014 [place].

## 2013-08-08 NOTE — ED Provider Notes (Signed)
CSN: [id number]     Arrival date & time 08/08/13  1021 History   First MD Initiated Contact with Patient 08/08/13 1028     Chief Complaint  Patient presents with  . Hand Injury    Left hand     (Consider location/radiation/quality/duration/timing/severity/associated sxs/prior Treatment) HPI  This is a 28 y.o. female with PMH IBS, presenting today with pain, wounds to hand.  Occurred PTA.  Located left hand.  Pain is persistent.  Sharp, throbbing.  No meds taken.  Radiates up LUE.  Positive for decreased sensation, but negative for frank numbness or paralysis.    Mechanism was hand caught in conveyor belt.  Was at work.  Past Medical History  Diagnosis Date  . IBS (irritable bowel syndrome)   . Ovarian cyst   . Diarrhea   . Colitis   . Family history of malignant neoplasm of gastrointestinal tract   . PONV (postoperative nausea and vomiting)   . GERD (gastroesophageal reflux disease)   . Arthritis     lumbar back  . Anxiety   . ADD (attention deficit disorder)     recently started on Adderol  . Skin cancer     abdomen and neck- states not melanoma   Past Surgical History  Procedure Laterality Date  . Wisdom tooth extraction    . Appendectomy  10/2008  . Spine surgery  08/2005    L5 - S1 fusion  . Colon resection  04/02/2011    Procedure: COLON RESECTION LAPAROSCOPIC;  Surgeon: Pedro Earls, MD;  Location: [facility];  Service: General;  Laterality: N/A;  Laparscopic  Assisted Ileocecostomy    Family History  Problem Relation Age of Onset  . Colon cancer Paternal Grandfather   . Breast cancer Paternal Grandmother   . Colon polyps Paternal Grandmother   . Diabetes Maternal Grandmother   . Heart disease Maternal Grandmother   . Cirrhosis Maternal Grandfather     heavy drinker   History  Substance Use Topics  . Smoking status: Former Smoker -- 0.25 packs/day for 2 years    Types: Cigarettes    Quit date: 04/28/2010  . Smokeless tobacco: Former Systems developer    Quit date:  01/29/2011  . Alcohol Use: No     Comment: rarely   OB History   Grav Para Term Preterm Abortions TAB SAB Ect Mult Living                 Review of Systems  Constitutional: Negative for fever and chills.  HENT: Negative for facial swelling.   Eyes: Negative for photophobia and pain.  Respiratory: Negative for cough and shortness of breath.   Cardiovascular: Negative for chest pain and leg swelling.  Gastrointestinal: Negative for nausea, vomiting and abdominal pain.  Genitourinary: Negative for dysuria.  Musculoskeletal: Positive for arthralgias.  Skin: Positive for wound. Negative for rash.  Neurological: Negative for seizures.  Hematological: Negative for adenopathy.      Allergies  Percocet and Soy allergy  Home Medications   Current Outpatient Rx  Name  Route  Sig  Dispense  Refill  . amphetamine-dextroamphetamine (ADDERALL) 30 MG tablet   Oral   Take 1 tablet (30 mg total) by mouth 2 (two) times daily.   60 tablet   0   . fluticasone (FLONASE) 50 MCG/ACT nasal spray   Each Nare   Place 2 sprays into both nostrils daily.   16 g   6   . hydrochlorothiazide (HYDRODIURIL) 25 MG tablet  Oral   Take 25 mg by mouth daily as needed (swelling).         Marland Kitchen HYDROcodone-acetaminophen (NORCO/VICODIN) 5-325 MG per tablet   Oral   Take 1 tablet by mouth every 6 (six) hours as needed for moderate pain.   30 tablet   0   . hydroxychloroquine (PLAQUENIL) 200 MG tablet   Oral   Take 400 mg by mouth daily.          . norethindrone-ethinyl estradiol (JUNEL FE,GILDESS FE,LOESTRIN FE) 1-20 MG-MCG tablet   Oral   Take 1 tablet by mouth at bedtime.          Marland Kitchen omeprazole (PRILOSEC) 20 MG capsule   Oral   Take 20 mg by mouth daily.         Marland Kitchen SALINE NASAL MIST NA   Each Nare   Place 1-2 sprays into both nostrils daily as needed (allergies).          BP 149/95  Pulse 113  Temp(Src) 97.7 F (36.5 C) (Oral)  Resp 17  Ht 5' 5.5" (1.664 m)  Wt 135 lb (61.236  kg)  BMI 22.12 kg/m2  SpO2 100%  LMP 08/01/2013 Physical Exam  Constitutional: She is oriented to person, place, and time. She appears well-developed and well-nourished. No distress.  HENT:  Head: Normocephalic and atraumatic.  Mouth/Throat: No oropharyngeal exudate.  Eyes: Conjunctivae are normal. Pupils are equal, round, and reactive to light. No scleral icterus.  Neck: Normal range of motion. No tracheal deviation present. No thyromegaly present.  Cardiovascular: Normal rate, regular rhythm and normal heart sounds.  Exam reveals no gallop and no friction rub.   No murmur heard. Pulmonary/Chest: Effort normal and breath sounds normal. No stridor. No respiratory distress. She has no wheezes. She has no rales. She exhibits no tenderness.  Abdominal: Soft. She exhibits no distension and no mass. There is no tenderness. There is no rebound and no guarding.  Musculoskeletal: Normal range of motion. She exhibits no edema.  Pt has movement in all nerve distributions in the left hand.  She has decreased sensation, mostly in thumb, but no numbness.  She has good cap refill in all digits.  Positive for small puncture-like wounds on the volar aspect of the third digit  Neurological: She is alert and oriented to person, place, and time.  Skin: Skin is warm and dry. She is not diaphoretic.    ED Course  Procedures (including critical care time) Labs Review Labs Reviewed - No data to display Imaging Review Dg Elbow Complete Left  08/08/2013   CLINICAL DATA:  Pain post trauma  EXAM: LEFT ELBOW - COMPLETE 3+ VIEW  COMPARISON:  None.  FINDINGS: Frontal, lateral, and bilateral oblique views were obtained. There is no fracture or dislocation. No joint effusion. Joint spaces appear intact. No erosive change.  IMPRESSION: No abnormality noted.   Electronically Signed   By: Lowella Grip M.D.   On: 08/08/2013 12:41   Dg Forearm Left  08/08/2013   CLINICAL DATA:  Pain post trauma  EXAM: LEFT FOREARM - 2  VIEW  COMPARISON:  None.  FINDINGS: Frontal and lateral views were obtained. No fracture or dislocation. Joint spaces appear intact. No erosive change.  IMPRESSION: No abnormality noted.   Electronically Signed   By: Lowella Grip M.D.   On: 08/08/2013 12:40   Dg Wrist Complete Left  08/08/2013   CLINICAL DATA:  Pain post trauma  EXAM: LEFT WRIST - COMPLETE 3+ VIEW  COMPARISON:  None.  FINDINGS: Frontal, oblique, lateral, and ulnar deviation scaphoid images were obtained. No fracture or dislocation. Joint spaces appear intact. No erosive change.  IMPRESSION: No abnormality noted.   Electronically Signed   By: Bretta Bang M.D.   On: 08/08/2013 12:40   Dg Shoulder Left  08/08/2013   CLINICAL DATA:  Pain post trauma  EXAM: LEFT SHOULDER - 2+ VIEW  COMPARISON:  None.  FINDINGS: Frontal and Y scapular views were obtained. No fracture or dislocation. Joint spaces appear intact. No erosive change.  IMPRESSION: No abnormality noted.   Electronically Signed   By: Bretta Bang M.D.   On: 08/08/2013 12:42   Dg Humerus Left  08/08/2013   CLINICAL DATA:  Pain post trauma  EXAM: LEFT HUMERUS - 2+ VIEW  COMPARISON:  None.  FINDINGS: Frontal and lateral views were obtained. No fracture or dislocation. Joint spaces appear intact. No abnormal periosteal reaction. .  IMPRESSION: No abnormality noted.   Electronically Signed   By: Bretta Bang M.D.   On: 08/08/2013 12:41   Dg Hand Complete Left  08/08/2013   CLINICAL DATA:  Pain post trauma  EXAM: LEFT HAND - COMPLETE 3+ VIEW  COMPARISON:  None.  FINDINGS: Frontal, oblique, and lateral views were obtained. There are comminuted fractures of the proximal aspects of the third and fourth proximal phalanges with overall near anatomic alignment. There obliquely oriented fractures through the medial aspects of the distal portions of the third and fourth distal phalanges. No other fractures are appreciable. No dislocation. Joint spaces appear intact.   IMPRESSION: Comminuted fractures of the proximal portions of the third and fourth proximal phalanges. Nondisplaced obliquely oriented fractures of the distal portions of the third and fourth distal phalanges. No dislocations.   Electronically Signed   By: Bretta Bang M.D.   On: 08/08/2013 12:39     EKG Interpretation None      MDM   Final diagnoses:  None    This is a 28 y.o. female with PMH IBS, presenting today with pain, wounds to hand.  Occurred PTA.  Located left hand.  Pain is persistent.  Sharp, throbbing.  No meds taken.  Radiates up LUE.  Positive for decreased sensation, but negative for frank numbness or paralysis.    Mechanism was hand caught in conveyor belt.  Was at work.  Unsure of tetanus status.  I have ordered tetanus injection.  Will treat pain with dilaudid.  No emergent reduction indicated at this time.  Will fu hand, wrist, forearm, elbow and humerus XRs, continue to monitor closely.    XRsd as above reveal comminuted fxs of proximal phalanges of digits 3 and 4.  Also has likely fx of proximal portion of distal thumb phalanx.    Wound cleaned, irrigated by nursing.  I have spent much time counseling family with XRs at bedside.  I have touched base with Dr. Mina Marble who does not recommend emergent intervention.  He will see Ms. Lenahan in his clinic in the next two days.  Techs are placing splint at this time.  Will reevaluate shortly, discharge on oral pain medications.     Splint has been placed without complication.  Post splint evaluation reveals the extremity remains neurovascularly intact.  I am discharging Ms. Deutscher with a prescription for Norco.  Nursing has arranged followup appointment with Dr. Mina Marble at 8:15 tomorrow morning.  All questions have been answered.  I have discussed case and care has been guided by my attending  physician, Dr. Ashok Cordia.  Doy Hutching, MD 08/08/13 [phone]

## 2013-08-08 NOTE — ED Notes (Signed)
Pt undressed, in gown, on monitor, continuous pulse oximetry and blood pressure cuff; Jodi Mourning, Res MD in with pt; family at bedside

## 2013-08-08 NOTE — ED Notes (Signed)
Received pt via EMS with c/o while at work got her hand caught in the conveyer belt. Facility had to take the conveyer apart to get her hand out. Pt was wearing safety gloves at the time. Pt presents with swellling to left hand. Pt also has scratches and bruising to left thumb. Pt has laceration to palm of left hand.

## 2013-08-09 NOTE — ED Provider Notes (Signed)
I saw and evaluated the patient, reviewed the resident's note and I agree with the findings and plan.   EKG Interpretation None      Pt c/o left hand injury, got caught in conveyer belt at work. Abrasions/puncture wounds, contusion to hand.  Xr. Resulted noted. Hand surgery consulted.   Mirna Mires, MD 08/09/13 [phone]

## 2013-08-10 ENCOUNTER — Other Ambulatory Visit: Payer: Self-pay | Admitting: *Deleted

## 2013-08-10 ENCOUNTER — Telehealth: Payer: Self-pay | Admitting: Family Medicine

## 2013-08-10 MED ORDER — FLUCONAZOLE 150 MG PO TABS: ORAL_TABLET | ORAL | Status: DC

## 2013-08-10 NOTE — Telephone Encounter (Signed)
Pt had surgery on her hand due to an accident at work. They have put her on antibiotic, and now pt has a yeast infection. The attending physician states that a med for this is not in his scope of practice and pt needs to contact pcp. Pt would like a med for this yeast infection. [facility]

## 2013-08-10 NOTE — Telephone Encounter (Signed)
Rx sent per Dr Todd 

## 2013-08-10 NOTE — Telephone Encounter (Signed)
Note sent to My chart.  Attempted to call Mom but no answer.

## 2013-08-17 ENCOUNTER — Telehealth: Payer: Self-pay | Admitting: Family Medicine

## 2013-08-17 DIAGNOSIS — F988 Other specified behavioral and emotional disorders with onset usually occurring in childhood and adolescence: Secondary | ICD-10-CM

## 2013-08-17 NOTE — Telephone Encounter (Signed)
Pt states the directions for her amphetamine-dextroamphetamine (ADDERALL) 30 MG tablet Were wrong on her 2nd and 3rd scripts. It is supposed to say 1/bid, but is states 1/ day. quanity is correct. Pt would like to know what to know.. Pt has left rx at the pharmacy.  Mom will be in the area this afternoon. pls advise

## 2013-08-17 NOTE — Telephone Encounter (Signed)
Please call patient and have the prescriptions dropped off and Dr Sherren Mocha will write new ones tomorrow

## 2013-08-17 NOTE — Telephone Encounter (Signed)
Pt aware.

## 2013-08-18 MED ORDER — AMPHETAMINE-DEXTROAMPHETAMINE 30 MG PO TABS: 30.0000 mg | ORAL_TABLET | Freq: Two times a day (BID) | ORAL | Status: DC

## 2013-08-18 NOTE — Addendum Note (Signed)
Addended by: Westley Hummer B on: 08/18/2013 01:23 PM   Modules accepted: Orders

## 2013-10-05 ENCOUNTER — Other Ambulatory Visit: Payer: Self-pay | Admitting: Family Medicine

## 2013-10-17 ENCOUNTER — Telehealth: Payer: Self-pay | Admitting: Family Medicine

## 2013-10-17 NOTE — Telephone Encounter (Signed)
Pt needs refill on amphetamine-dextroamphetamine (ADDERALL) 30 MG tablet °

## 2013-10-18 NOTE — Telephone Encounter (Signed)
Too early for refill and a message was sent through mychart.

## 2013-10-20 ENCOUNTER — Telehealth: Payer: Self-pay | Admitting: Family Medicine

## 2013-10-20 DIAGNOSIS — F988 Other specified behavioral and emotional disorders with onset usually occurring in childhood and adolescence: Secondary | ICD-10-CM

## 2013-10-20 NOTE — Telephone Encounter (Signed)
Rache pt ask if you call her she said you know the distance in which she lives . I told her you were the one that said it was to early to refill

## 2013-10-21 NOTE — Telephone Encounter (Signed)
Patient stated that she had "lost her prescription".  I called the pharmacy and [facility].

## 2013-10-25 MED ORDER — AMPHETAMINE-DEXTROAMPHETAMINE 30 MG PO TABS: 30.0000 mg | ORAL_TABLET | Freq: Two times a day (BID) | ORAL | Status: DC

## 2013-10-26 ENCOUNTER — Telehealth: Payer: Self-pay | Admitting: Family Medicine

## 2013-10-26 NOTE — Telephone Encounter (Signed)
Pt took adderall rx to [address] yesterday and pharm has ? Concerning and will not refill med until md calls. Pt is trying to go out of town today

## 2013-10-26 NOTE — Telephone Encounter (Signed)
Jasmine.  Okay to fill the prescription because she must have found the one she "lost".

## 2013-10-26 NOTE — Telephone Encounter (Signed)
Called pharmacy and spoke to [place] and he said he pulled a dispensing report on pt and there has been numerous early refills and does not feel comfortable filling Rx till he talks to Dr. Sherren Mocha. Told him Dr. Sherren Mocha will be in tomorrow and I will have him call the pharmacy. Loleta Dicker said he faxed over dispensing report to [facility]. Told him okay thanks.

## 2013-10-27 NOTE — Telephone Encounter (Signed)
Dr. Sherren Mocha notified that patient of patient activity in regards to her getting her controlled meds filled at multiple locations, and states that the patient is no longer to received controlled medications from this practice

## 2013-11-03 ENCOUNTER — Telehealth: Payer: Self-pay | Admitting: Family Medicine

## 2013-11-03 NOTE — Telephone Encounter (Signed)
Pt called and notified that per dr. Sherren Mocha that she would need to schedule an appt to discuss this matter, which was scheduled

## 2013-11-03 NOTE — Telephone Encounter (Signed)
Pt following up on request to get her adderall filled.  Pt would like dr Sherren Mocha to give her a call so she can explain,

## 2013-11-03 NOTE — Telephone Encounter (Signed)
Pt stated she was not aware she can not use multiple pharmacy. Please advise

## 2013-11-07 ENCOUNTER — Encounter: Payer: Self-pay | Admitting: Family Medicine

## 2013-11-07 ENCOUNTER — Ambulatory Visit (INDEPENDENT_AMBULATORY_CARE_PROVIDER_SITE_OTHER): Payer: 59 | Admitting: Family Medicine

## 2013-11-07 VITALS — BP 120/80 | Temp 98.3°F | Wt 149.0 lb

## 2013-11-07 DIAGNOSIS — F908 Attention-deficit hyperactivity disorder, other type: Secondary | ICD-10-CM

## 2013-11-07 DIAGNOSIS — D239 Other benign neoplasm of skin, unspecified: Secondary | ICD-10-CM

## 2013-11-07 DIAGNOSIS — F909 Attention-deficit hyperactivity disorder, unspecified type: Secondary | ICD-10-CM

## 2013-11-07 DIAGNOSIS — F988 Other specified behavioral and emotional disorders with onset usually occurring in childhood and adolescence: Secondary | ICD-10-CM

## 2013-11-07 DIAGNOSIS — R300 Dysuria: Secondary | ICD-10-CM

## 2013-11-07 DIAGNOSIS — M545 Low back pain, unspecified: Secondary | ICD-10-CM

## 2013-11-07 LAB — POCT URINALYSIS DIPSTICK
BILIRUBIN UA: NEGATIVE
Glucose, UA: NEGATIVE
KETONES UA: NEGATIVE
Leukocytes, UA: NEGATIVE
Nitrite, UA: NEGATIVE
PH UA: 6
Protein, UA: NEGATIVE
RBC UA: NEGATIVE
Spec Grav, UA: >=1.03
Urobilinogen, UA: 0.2

## 2013-11-07 MED ORDER — AMPHETAMINE-DEXTROAMPHETAMINE 30 MG PO TABS: 30.0000 mg | ORAL_TABLET | Freq: Two times a day (BID) | ORAL | Status: DC

## 2013-11-07 MED ORDER — HYDROCHLOROTHIAZIDE 25 MG PO TABS: 25.0000 mg | ORAL_TABLET | Freq: Every day | ORAL | Status: DC | PRN

## 2013-11-07 MED ORDER — FLUTICASONE PROPIONATE 50 MCG/ACT NA SUSP: 2.0000 | Freq: Every day | NASAL | Status: DC

## 2013-11-07 MED ORDER — OMEPRAZOLE 20 MG PO CPDR
20.0000 mg | DELAYED_RELEASE_CAPSULE | Freq: Every day | ORAL | Status: DC
Start: 2013-11-07 — End: 2014-07-18

## 2013-11-07 MED ORDER — CEPHALEXIN 500 MG PO CAPS: ORAL_CAPSULE | ORAL | Status: DC

## 2013-11-07 NOTE — Progress Notes (Signed)
Pre visit review using our clinic review tool, if applicable. No additional management support is needed unless otherwise documented below in the visit note. 

## 2013-11-07 NOTE — Progress Notes (Signed)
   Subjective:    Patient ID: SHAYANNE GOMM, female    DOB: August 06, 1985, 28 y.o.   MRN: [id number]  HPI Norma Harmon is a 28 year old single female nonsmoker who comes in today to discuss numerous issues  She takes Adderall 30 mg long-acting twice a day and a pressure that we received shows she was getting narcotics from multiple pharmacies. We therefore asked her to come in to review this issue. She states she hurt her hand at work in April of this year and had surgery by Dr. wind cold the Copy. They had to put in for pins. She's back to work 2 days doing physical therapy the other 3 days. Dr. Stacie Acres oldest given her the pain medication. She's also getting pain medication by Dr.: Her neurosurgeon for chronic back pain.  She is refills on Flonase hydrochlorothiazide and Prilosec.  She's also complaining of pain in her left ear and drainage.  She's also complaining dysuria for one week  She also has a spot not on her left shoulder she would like checked.   Review of Systems    review of systems otherwise negative Objective:   Physical Exam  Well-developed well-nourished female no acute distress vital signs stable she is afebrile examination of the back shows a small lipoma  HEENT was negative except for canal infection left ear canal  Urinalysis normal  Skin exam normal      Assessment & Plan:

## 2013-11-07 NOTE — Patient Instructions (Signed)
Continue your current medication  Call 2 weeks from being out of your third Adderall prescription for refills

## 2013-11-28 ENCOUNTER — Other Ambulatory Visit: Payer: Self-pay | Admitting: Orthopaedic Surgery

## 2013-11-28 DIAGNOSIS — M542 Cervicalgia: Secondary | ICD-10-CM

## 2013-11-28 DIAGNOSIS — M546 Pain in thoracic spine: Secondary | ICD-10-CM

## 2013-12-04 ENCOUNTER — Ambulatory Visit
Admission: RE | Admit: 2013-12-04 | Discharge: 2013-12-04 | Disposition: A | Discharge status: Home / Self Care | Payer: 59 | Source: Ambulatory Visit | Attending: Orthopaedic Surgery | Admitting: Orthopaedic Surgery

## 2013-12-04 DIAGNOSIS — M542 Cervicalgia: Secondary | ICD-10-CM

## 2013-12-04 DIAGNOSIS — M546 Pain in thoracic spine: Secondary | ICD-10-CM

## 2013-12-05 ENCOUNTER — Telehealth: Payer: Self-pay | Admitting: Family Medicine

## 2013-12-05 DIAGNOSIS — R300 Dysuria: Secondary | ICD-10-CM

## 2013-12-05 NOTE — Telephone Encounter (Signed)
Pt request refill of the following:  Pt called to say that [facility] not going to [facility]  hydrochlorothiazide (HYDRODIURIL) 25 MG tablet  And would like to know if there is something else he can rx   Pt said she also is still having her ear problem and would like some antibiotic      [facility] [address]

## 2013-12-05 NOTE — Telephone Encounter (Signed)
[facility] not [facility] any more due to safety for the patient. She is also having the same problem with her ear and would like a refill of keflex.

## 2013-12-06 ENCOUNTER — Ambulatory Visit: Payer: Self-pay | Admitting: Family Medicine

## 2013-12-07 ENCOUNTER — Telehealth: Payer: Self-pay | Admitting: Family Medicine

## 2013-12-07 MED ORDER — CEPHALEXIN 500 MG PO CAPS: ORAL_CAPSULE | ORAL | Status: DC

## 2013-12-07 NOTE — Telephone Encounter (Signed)
Pt states the pharm has a note that pt cannot get any controlled rx filled. Pt got one filled 7/13. Now they will not fill the next due the note still being there.  [facility] [address] Pt needs med for tomorrow, thurs 8/13. Can you call pharm today? Thanks! pls leave message if pt doesn't answer

## 2013-12-07 NOTE — Telephone Encounter (Signed)
Already spoke with pharmacy already

## 2013-12-07 NOTE — Telephone Encounter (Signed)
Spoke with pharmacist and they still carry HCTZ.  Patient can take bottle back.  Okay to refill ATB per Dr Sherren Mocha. Left message on machine for patient.

## 2014-01-11 ENCOUNTER — Telehealth: Payer: Self-pay | Admitting: Family Medicine

## 2014-01-11 DIAGNOSIS — R300 Dysuria: Secondary | ICD-10-CM

## 2014-01-11 MED ORDER — HYDROCHLOROTHIAZIDE 25 MG PO TABS: 25.0000 mg | ORAL_TABLET | Freq: Every day | ORAL | Status: DC | PRN

## 2014-01-11 NOTE — Telephone Encounter (Signed)
[facility] is requesting re-fill on hydrochlorothiazide (HYDRODIURIL) 25 MG tablet

## 2014-01-17 ENCOUNTER — Encounter: Payer: Self-pay | Admitting: Physician Assistant

## 2014-01-17 ENCOUNTER — Ambulatory Visit (INDEPENDENT_AMBULATORY_CARE_PROVIDER_SITE_OTHER): Payer: 59 | Admitting: Physician Assistant

## 2014-01-17 VITALS — BP 130/90 | HR 72 | Temp 98.2°F | Resp 18 | Wt 136.8 lb

## 2014-01-17 DIAGNOSIS — J069 Acute upper respiratory infection, unspecified: Secondary | ICD-10-CM

## 2014-01-17 NOTE — Progress Notes (Signed)
Subjective:    Patient ID: Norma Harmon, female    DOB: Sep 21, 1985, 28 y.o.   MRN: [id number]  Cough This is a new problem. The current episode started in the past 7 days (3 days). The problem has been gradually worsening. The problem occurs every few minutes. The cough is productive of sputum. Associated symptoms include chills, ear congestion, ear pain (uncomfortable, right side), a fever, headaches (mild), nasal congestion, postnasal drip, rhinorrhea and a sore throat. Pertinent negatives include no chest pain, heartburn, hemoptysis, myalgias, rash, shortness of breath, sweats, weight loss or wheezing. The symptoms are aggravated by lying down. Treatments tried: zyrtec, dayquil. The treatment provided mild relief. Her past medical history is significant for environmental allergies. There is no history of asthma or COPD.      Review of Systems  Constitutional: Positive for fever and chills. Negative for weight loss.  HENT: Positive for ear pain (uncomfortable, right side), postnasal drip, rhinorrhea and sore throat.   Respiratory: Positive for cough. Negative for hemoptysis, shortness of breath and wheezing.   Cardiovascular: Negative for chest pain.  Gastrointestinal: Positive for nausea. Negative for heartburn, vomiting and diarrhea.  Musculoskeletal: Negative for myalgias.  Skin: Negative for rash.  Allergic/Immunologic: Positive for environmental allergies.  Neurological: Positive for headaches (mild). Negative for syncope.  All other systems reviewed and are negative.    Past Medical History  Diagnosis Date  . IBS (irritable bowel syndrome)   . Ovarian cyst   . Diarrhea   . Colitis   . Family history of malignant neoplasm of gastrointestinal tract   . PONV (postoperative nausea and vomiting)   . GERD (gastroesophageal reflux disease)   . Arthritis     lumbar back  . Anxiety   . ADD (attention deficit disorder)     recently started on Adderol  . Skin cancer    abdomen and neck- states not melanoma    History   Social History  . Marital Status: Divorced    Spouse Name: N/A    Number of Children: 0  . Years of Education: N/A   Occupational History  . Corporate investment banker    Social History Main Topics  . Smoking status: Former Smoker -- 0.25 packs/day for 2 years    Types: Cigarettes    Quit date: 04/28/2010  . Smokeless tobacco: Former Systems developer    Quit date: 01/29/2011  . Alcohol Use: No     Comment: rarely  . Drug Use: No  . Sexual Activity: Yes    Birth Control/ Protection: Other-see comments     Comment: nuvaring   Other Topics Concern  . Not on file   Social History Narrative  . No narrative on file    Past Surgical History  Procedure Laterality Date  . Wisdom tooth extraction    . Appendectomy  10/2008  . Spine surgery  08/2005    L5 - S1 fusion  . Colon resection  04/02/2011    Procedure: COLON RESECTION LAPAROSCOPIC;  Surgeon: Pedro Earls, MD;  Location: [facility];  Service: General;  Laterality: N/A;  Laparscopic  Assisted Ileocecostomy     Family History  Problem Relation Age of Onset  . Colon cancer Paternal Grandfather   . Breast cancer Paternal Grandmother   . Colon polyps Paternal Grandmother   . Diabetes Maternal Grandmother   . Heart disease Maternal Grandmother   . Cirrhosis Maternal Grandfather     heavy drinker    Allergies  Allergen Reactions  .  Percocet [Oxycodone-Acetaminophen] Swelling  . Soy Allergy     Abdominal pains    Current Outpatient Prescriptions on File Prior to Visit  Medication Sig Dispense Refill  . amphetamine-dextroamphetamine (ADDERALL) 30 MG tablet Take 1 tablet (30 mg total) by mouth 2 (two) times daily.  60 tablet  0  . amphetamine-dextroamphetamine (ADDERALL) 30 MG tablet Take 1 tablet (30 mg total) by mouth 2 (two) times daily.  60 tablet  0  . amphetamine-dextroamphetamine (ADDERALL) 30 MG tablet Take 1 tablet (30 mg total) by mouth 2 (two) times daily.  60 tablet  0  .  hydrochlorothiazide (HYDRODIURIL) 25 MG tablet Take 1 tablet (25 mg total) by mouth daily as needed (swelling).  100 tablet  3  . HYDROcodone-acetaminophen (NORCO/VICODIN) 5-325 MG per tablet Take 1 tablet by mouth every 6 (six) hours as needed for moderate pain.  30 tablet  0  . hydroxychloroquine (PLAQUENIL) 200 MG tablet Take 400 mg by mouth daily.       . norethindrone-ethinyl estradiol (JUNEL FE,GILDESS FE,LOESTRIN FE) 1-20 MG-MCG tablet Take 1 tablet by mouth at bedtime.       Marland Kitchen omeprazole (PRILOSEC) 20 MG capsule Take 1 capsule (20 mg total) by mouth daily.  100 capsule  3  . SALINE NASAL MIST NA Place 1-2 sprays into both nostrils daily as needed (allergies).      . fluticasone (FLONASE) 50 MCG/ACT nasal spray Place 2 sprays into both nostrils daily.  16 g  6  . meloxicam (MOBIC) 15 MG tablet Take 15 mg by mouth daily.       No current facility-administered medications on file prior to visit.    EXAM: BP 130/90  Pulse 72  Temp(Src) 98.2 F (36.8 C) (Oral)  Resp 18  Wt 136 lb 12.8 oz (62.052 kg)     Objective:   Physical Exam  Nursing note and vitals reviewed. Constitutional: She is oriented to person, place, and time. She appears well-developed and well-nourished. No distress.  HENT:  Head: Normocephalic and atraumatic.  Right Ear: External ear normal.  Left Ear: External ear normal.  Nose: Nose normal.  Mouth/Throat: No oropharyngeal exudate.  Oropharynx is slightly erythematous, no exudate. Bilateral TMs normal. Bilateral frontal and maxillary sinuses non-TTP.  Eyes: Conjunctivae and EOM are normal. Pupils are equal, round, and reactive to light.  Neck: Normal range of motion. Neck supple.  Cardiovascular: Normal rate, regular rhythm and intact distal pulses.   Pulmonary/Chest: Effort normal. No stridor. No respiratory distress. She has no wheezes. She has no rales. She exhibits no tenderness.  Lymphadenopathy:    She has no cervical adenopathy.  Neurological: She is  alert and oriented to person, place, and time.  Skin: Skin is warm and dry. No rash noted. She is not diaphoretic. No erythema. No pallor.  Psychiatric: She has a normal mood and affect. Her behavior is normal. Judgment and thought content normal.     Lab Results  Component Value Date   WBC 3.9* 02/17/2013   HGB 12.6 02/17/2013   HCT 37.2 02/17/2013   PLT 209.0 02/17/2013   GLUCOSE 88 02/17/2013   ALT 16 12/16/2011   AST 17 12/16/2011   NA 138 02/17/2013   K 4.4 02/17/2013   CL 103 02/17/2013   CREATININE 0.9 02/17/2013   BUN 15 02/17/2013   CO2 27 02/17/2013   TSH 1.13 02/17/2013   HGBA1C 5.5 07/08/2012        Assessment & Plan:  Norma Harmon was seen today for  sore throat.  Diagnoses and associated orders for this visit:  Acute upper respiratory infections of unspecified site Comments: Symptomatic treatment with OTC Mucinex, Sudafed, antihistamine, nasal steroid, rest, push fluids. Watchful waiting.    Return precautions provided, and patient handout on URI.  Plan to follow up as needed, or for worsening or persistent symptoms despite treatment.  Patient Instructions  Plain Over the Counter Mucinex DM or Robitussin-DM (NOT Mucinex D) for thick secretions.  Sudafed to help with congestion.  Force NON dairy fluids, drinking plenty of water is best.    Over the Counter Flonase OR Nasacort AQ 1 spray in each nostril twice a day as needed. Use the "crossover" technique into opposite nostril spraying toward opposite ear @ 45 degree angle, not straight up into nostril.   Plain Over the Counter Allegra (NOT D )  160 daily , OR Loratidine 10 mg , OR Zyrtec 10 mg @ bedtime  as needed for itchy eyes & sneezing.  Saline Irrigation and Saline Sprays can also help reduce symptoms.  If emergency symptoms discussed during visit developed, seek medical attention immediately.  Followup as needed, or for worsening or persistent symptoms despite treatment.

## 2014-01-17 NOTE — Patient Instructions (Addendum)
Plain Over the Counter Mucinex DM or Robitussin-DM (NOT Mucinex D) for thick secretions.  Sudafed to help with congestion.  Force NON dairy fluids, drinking plenty of water is best.    Over the Counter Flonase OR Nasacort AQ 1 spray in each nostril twice a day as needed. Use the "crossover" technique into opposite nostril spraying toward opposite ear @ 45 degree angle, not straight up into nostril.   Plain Over the Counter Allegra (NOT D )  160 daily , OR Loratidine 10 mg , OR Zyrtec 10 mg @ bedtime  as needed for itchy eyes & sneezing.  Saline Irrigation and Saline Sprays can also help reduce symptoms.  If emergency symptoms discussed during visit developed, seek medical attention immediately.  Followup as needed, or for worsening or persistent symptoms despite treatment.   Upper Respiratory Infection, Adult An upper respiratory infection (URI) is also known as the common cold. It is often caused by a type of germ (virus). Colds are easily spread (contagious). You can pass it to others by kissing, coughing, sneezing, or drinking out of the same glass. Usually, you get better in 1 or 2 weeks.  HOME CARE   Only take medicine as told by your doctor.  Use a warm mist humidifier or breathe in steam from a hot shower.  Drink enough water and fluids to keep your pee (urine) clear or pale yellow.  Get plenty of rest.  Return to work when your temperature is back to normal or as told by your doctor. You may use a face mask and wash your hands to stop your cold from spreading. GET HELP RIGHT AWAY IF:   After the first few days, you feel you are getting worse.  You have questions about your medicine.  You have chills, shortness of breath, or brown or red spit (mucus).  You have yellow or brown snot (nasal discharge) or pain in the face, especially when you bend forward.  You have a fever, puffy (swollen) neck, pain when you swallow, or white spots in the back of your throat.  You have  a bad headache, ear pain, sinus pain, or chest pain.  You have a high-pitched whistling sound when you breathe in and out (wheezing).  You have a lasting cough or cough up blood.  You have sore muscles or a stiff neck. MAKE SURE YOU:   Understand these instructions.  Will watch your condition.  Will get help right away if you are not doing well or get worse. Document Released: 10/01/2007 Document Revised: 07/07/2011 Document Reviewed: 07/20/2013 [facility] Patient Information 2015 [place], [place]. This information is not intended to replace advice given to you by your health care provider. Make sure you discuss any questions you have with your health care provider.

## 2014-01-17 NOTE — Progress Notes (Signed)
Pre visit review using our clinic review tool, if applicable. No additional management support is needed unless otherwise documented below in the visit note. 

## 2014-01-18 ENCOUNTER — Telehealth: Payer: Self-pay | Admitting: Family Medicine

## 2014-01-18 NOTE — Telephone Encounter (Signed)
Pt saw mat yesterday for sore throat, cough, and right ear "inflammed". Pt did not receive any med  Advised to take OTC.  None of this worked . Pt wanted to see dr todd today. Cough is keeping her up and she has not slept.. pls advise.

## 2014-01-18 NOTE — Telephone Encounter (Signed)
Spoke with patient and an appointment scheduled 

## 2014-01-19 ENCOUNTER — Ambulatory Visit (INDEPENDENT_AMBULATORY_CARE_PROVIDER_SITE_OTHER): Payer: 59 | Admitting: Family Medicine

## 2014-01-19 ENCOUNTER — Encounter: Payer: Self-pay | Admitting: Family Medicine

## 2014-01-19 VITALS — BP 110/80 | Temp 98.6°F

## 2014-01-19 DIAGNOSIS — J069 Acute upper respiratory infection, unspecified: Secondary | ICD-10-CM

## 2014-01-19 DIAGNOSIS — F988 Other specified behavioral and emotional disorders with onset usually occurring in childhood and adolescence: Secondary | ICD-10-CM

## 2014-01-19 MED ORDER — HYDROCODONE-HOMATROPINE 5-1.5 MG/5ML PO SYRP: 5.0000 mL | ORAL_SOLUTION | Freq: Three times a day (TID) | ORAL | Status: DC | PRN

## 2014-01-19 MED ORDER — AMPHETAMINE-DEXTROAMPHETAMINE 30 MG PO TABS: 30.0000 mg | ORAL_TABLET | Freq: Two times a day (BID) | ORAL | Status: DC

## 2014-01-19 NOTE — Patient Instructions (Signed)
6 drink lots of liquids and  Hydromet..........Marland Kitchen 1/2-1 teaspoon 3 times daily when necessary for cough and cold

## 2014-01-19 NOTE — Progress Notes (Signed)
Pre visit review using our clinic review tool, if applicable. No additional management support is needed unless otherwise documented below in the visit note. 

## 2014-01-19 NOTE — Progress Notes (Signed)
   Subjective:    Patient ID: Norma Harmon, female    DOB: [birth year], 28 y.o.   MRN: [id number]  HPI Norma Harmon is a 28 year old female single nonsmoker who comes in today with a cold for 5 days  Her main complaint is coughing. No fever chills.   Review of Systems Review of systems otherwise negative mother and grandmother also have colds    Objective:   Physical Exam  Well-developed well-nourished female no acute distress signs stable she is afebrile HEENT negative neck was supple no adenopathy lungs are clear      Assessment & Plan:  Viral syndrome plan treat symptomatically

## 2014-02-23 ENCOUNTER — Encounter: Payer: Self-pay | Admitting: Family Medicine

## 2014-02-27 ENCOUNTER — Telehealth: Payer: Self-pay | Admitting: Family Medicine

## 2014-02-27 MED ORDER — METHYLPHENIDATE HCL ER (OSM) 36 MG PO TBCR: 36.0000 mg | EXTENDED_RELEASE_TABLET | Freq: Every day | ORAL | Status: DC

## 2014-02-27 NOTE — Telephone Encounter (Signed)
Pt called to ask if there is another medication other than adderall that does not have as many side effects. She said what about CONCERTA .Marland Kitchen

## 2014-02-27 NOTE — Telephone Encounter (Signed)
Okay for Rx for Concerta per Dr Sherren Mocha.  Patient is aware.

## 2014-03-06 ENCOUNTER — Telehealth: Payer: Self-pay | Admitting: Family Medicine

## 2014-03-06 NOTE — Telephone Encounter (Signed)
Pt was change to concerta and stated med is not strong enough. Pt would like rachel to return her call

## 2014-03-07 NOTE — Telephone Encounter (Signed)
Spoke with patient and she has scheduled an office visit

## 2014-03-13 ENCOUNTER — Encounter: Payer: Self-pay | Admitting: Family Medicine

## 2014-03-13 ENCOUNTER — Ambulatory Visit (INDEPENDENT_AMBULATORY_CARE_PROVIDER_SITE_OTHER): Payer: 59 | Admitting: Family Medicine

## 2014-03-13 VITALS — BP 150/100 | Temp 98.7°F | Wt 139.0 lb

## 2014-03-13 DIAGNOSIS — F909 Attention-deficit hyperactivity disorder, unspecified type: Secondary | ICD-10-CM

## 2014-03-13 DIAGNOSIS — F32A Depression, unspecified: Secondary | ICD-10-CM | POA: Insufficient documentation

## 2014-03-13 DIAGNOSIS — F901 Attention-deficit hyperactivity disorder, predominantly hyperactive type: Secondary | ICD-10-CM

## 2014-03-13 DIAGNOSIS — F988 Other specified behavioral and emotional disorders with onset usually occurring in childhood and adolescence: Secondary | ICD-10-CM

## 2014-03-13 DIAGNOSIS — F329 Major depressive disorder, single episode, unspecified: Secondary | ICD-10-CM

## 2014-03-13 MED ORDER — AMPHETAMINE-DEXTROAMPHETAMINE 30 MG PO TABS: 30.0000 mg | ORAL_TABLET | Freq: Two times a day (BID) | ORAL | Status: DC

## 2014-03-13 NOTE — Patient Instructions (Signed)
Adderall 20 mg.......... One tablet daily  Return the Concerta  Called the office of Dr. Letta Moynahan and make an appointment to see one of her associates ASAP

## 2014-03-13 NOTE — Progress Notes (Signed)
Pre visit review using our clinic review tool, if applicable. No additional management support is needed unless otherwise documented below in the visit note. 
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
## 2014-03-27 ENCOUNTER — Other Ambulatory Visit: Payer: Self-pay

## 2014-03-28 ENCOUNTER — Encounter: Payer: Self-pay | Admitting: Family Medicine

## 2014-03-29 ENCOUNTER — Other Ambulatory Visit (INDEPENDENT_AMBULATORY_CARE_PROVIDER_SITE_OTHER): Payer: 59

## 2014-03-29 DIAGNOSIS — Z0000 Encounter for general adult medical examination without abnormal findings: Secondary | ICD-10-CM

## 2014-03-29 LAB — BASIC METABOLIC PANEL
BUN: 11 mg/dL (ref 6–23)
CHLORIDE: 103 meq/L (ref 96–112)
CO2: 25 mEq/L (ref 19–32)
Calcium: 9.4 mg/dL (ref 8.4–10.5)
Creatinine, Ser: 0.9 mg/dL (ref 0.4–1.2)
GFR: 78.74 mL/min (ref >60.00)
Glucose, Bld: 88 mg/dL (ref 70–99)
POTASSIUM: 3.3 meq/L — AB (ref 3.5–5.1)
SODIUM: 138 meq/L (ref 135–145)

## 2014-03-29 LAB — LIPID PANEL
Cholesterol: 202 mg/dL — ABNORMAL HIGH (ref 0–200)
HDL: 65.7 mg/dL (ref >39.00)
LDL CALC: 120 mg/dL — AB (ref 0–99)
NonHDL: 136.3
Total CHOL/HDL Ratio: 3
Triglycerides: 83 mg/dL (ref 0.0–149.0)
VLDL: 16.6 mg/dL (ref 0.0–40.0)

## 2014-03-29 LAB — POCT URINALYSIS DIPSTICK
Bilirubin, UA: NEGATIVE
Glucose, UA: NEGATIVE
Ketones, UA: NEGATIVE
LEUKOCYTES UA: NEGATIVE
Nitrite, UA: NEGATIVE
PH UA: 6
PROTEIN UA: NEGATIVE
RBC UA: NEGATIVE
Spec Grav, UA: 1.015
Urobilinogen, UA: 0.2

## 2014-03-29 LAB — HEPATIC FUNCTION PANEL
ALT: 12 U/L (ref 0–35)
AST: 19 U/L (ref 0–37)
Albumin: 3.9 g/dL (ref 3.5–5.2)
Alkaline Phosphatase: 36 U/L — ABNORMAL LOW (ref 39–117)
Bilirubin, Direct: 0 mg/dL (ref 0.0–0.3)
TOTAL PROTEIN: 7 g/dL (ref 6.0–8.3)
Total Bilirubin: 0.6 mg/dL (ref 0.2–1.2)

## 2014-03-29 LAB — TSH: TSH: 0.95 u[IU]/mL (ref 0.35–4.50)

## 2014-03-29 LAB — CBC WITH DIFFERENTIAL/PLATELET
BASOS PCT: 0.8 % (ref 0.0–3.0)
Basophils Absolute: 0 10*3/uL (ref 0.0–0.1)
EOS PCT: 1.7 % (ref 0.0–5.0)
Eosinophils Absolute: 0.1 10*3/uL (ref 0.0–0.7)
HCT: 41.3 % (ref 36.0–46.0)
HEMOGLOBIN: 13.7 g/dL (ref 12.0–15.0)
LYMPHS ABS: 1.4 10*3/uL (ref 0.7–4.0)
Lymphocytes Relative: 32 % (ref 12.0–46.0)
MCHC: 33.2 g/dL (ref 30.0–36.0)
MCV: 85.6 fl (ref 78.0–100.0)
MONOS PCT: 9.3 % (ref 3.0–12.0)
Monocytes Absolute: 0.4 10*3/uL (ref 0.1–1.0)
Neutro Abs: 2.4 10*3/uL (ref 1.4–7.7)
Neutrophils Relative %: 56.2 % (ref 43.0–77.0)
Platelets: 243 10*3/uL (ref 150.0–400.0)
RBC: 4.82 Mil/uL (ref 3.87–5.11)
RDW: 13.9 % (ref 11.5–15.5)
WBC: 4.3 10*3/uL (ref 4.0–10.5)

## 2014-04-04 ENCOUNTER — Other Ambulatory Visit: Payer: Self-pay | Admitting: Family Medicine

## 2014-04-11 ENCOUNTER — Ambulatory Visit (INDEPENDENT_AMBULATORY_CARE_PROVIDER_SITE_OTHER): Payer: 59 | Admitting: Family Medicine

## 2014-04-11 ENCOUNTER — Encounter: Payer: Self-pay | Admitting: Family Medicine

## 2014-04-11 VITALS — BP 140/90 | Temp 98.0°F | Ht 65.5 in | Wt 137.0 lb

## 2014-04-11 DIAGNOSIS — F909 Attention-deficit hyperactivity disorder, unspecified type: Secondary | ICD-10-CM

## 2014-04-11 DIAGNOSIS — R109 Unspecified abdominal pain: Secondary | ICD-10-CM

## 2014-04-11 DIAGNOSIS — F901 Attention-deficit hyperactivity disorder, predominantly hyperactive type: Secondary | ICD-10-CM

## 2014-04-11 DIAGNOSIS — J301 Allergic rhinitis due to pollen: Secondary | ICD-10-CM

## 2014-04-11 DIAGNOSIS — J309 Allergic rhinitis, unspecified: Secondary | ICD-10-CM | POA: Insufficient documentation

## 2014-04-11 DIAGNOSIS — F988 Other specified behavioral and emotional disorders with onset usually occurring in childhood and adolescence: Secondary | ICD-10-CM

## 2014-04-11 DIAGNOSIS — R300 Dysuria: Secondary | ICD-10-CM

## 2014-04-11 DIAGNOSIS — D510 Vitamin B12 deficiency anemia due to intrinsic factor deficiency: Secondary | ICD-10-CM

## 2014-04-11 MED ORDER — AMPHETAMINE-DEXTROAMPHETAMINE 30 MG PO TABS: 30.0000 mg | ORAL_TABLET | Freq: Two times a day (BID) | ORAL | Status: DC

## 2014-04-11 MED ORDER — AMPHETAMINE-DEXTROAMPHET ER 30 MG PO CP24: ORAL_CAPSULE | ORAL | Status: DC

## 2014-04-11 MED ORDER — FLUTICASONE PROPIONATE 50 MCG/ACT NA SUSP: 2.0000 | Freq: Every day | NASAL | Status: DC

## 2014-04-11 NOTE — Patient Instructions (Signed)
Continue your current medications  Follow-up in 1 year sooner if any problems  Call 2 weeks from being out of your third Adderall prescriptions and leave a voicemail with Apolonio Schneiders for refills  B12 level today
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
## 2014-04-11 NOTE — Progress Notes (Signed)
Pre visit review using our clinic review tool, if applicable. No additional management support is needed unless otherwise documented below in the visit note. 

## 2014-04-12 LAB — VITAMIN B12: VITAMIN B 12: 268 pg/mL (ref 211–911)

## 2014-04-17 ENCOUNTER — Other Ambulatory Visit: Payer: Self-pay | Admitting: *Deleted

## 2014-04-17 ENCOUNTER — Encounter: Payer: Self-pay | Admitting: Family Medicine

## 2014-04-17 MED ORDER — "SYRINGE/NEEDLE (DISP) 25G X 5/8"" 3 ML MISC": 1.0000 | Status: DC

## 2014-04-17 MED ORDER — CYANOCOBALAMIN 1000 MCG/ML IJ SOLN: 1000.0000 ug | Freq: Once | INTRAMUSCULAR | Status: DC

## 2014-04-17 NOTE — Telephone Encounter (Signed)
Left message on machine returning patient's call 

## 2014-04-17 NOTE — Telephone Encounter (Signed)
Spoke with patient.

## 2014-04-18 ENCOUNTER — Other Ambulatory Visit: Payer: Self-pay | Admitting: *Deleted

## 2014-04-18 DIAGNOSIS — F988 Other specified behavioral and emotional disorders with onset usually occurring in childhood and adolescence: Secondary | ICD-10-CM

## 2014-04-18 MED ORDER — AMPHETAMINE-DEXTROAMPHETAMINE 30 MG PO TABS: 30.0000 mg | ORAL_TABLET | Freq: Two times a day (BID) | ORAL | Status: DC

## 2014-04-18 NOTE — Telephone Encounter (Signed)
A prescription for Adderall Xr 30 bid was given.  Patient will bring back prescriptions to be shredded.  New prescriptions will be given.

## 2014-04-18 NOTE — Telephone Encounter (Signed)
Spoke w/ pt. She will bring rx and we will switch out

## 2014-04-18 NOTE — Telephone Encounter (Signed)
Patient brought back old prescriptions which were shredded. New prescriptions given.

## 2014-05-01 ENCOUNTER — Telehealth: Payer: Self-pay | Admitting: Family Medicine

## 2014-05-01 NOTE — Telephone Encounter (Signed)
PLEASE CALL PT [phone]

## 2014-05-01 NOTE — Telephone Encounter (Signed)
Left detailed message on machine for patient and Rx ready for pick up.

## 2014-05-01 NOTE — Telephone Encounter (Signed)
Left swollen with pain and cannot open.  Please advise.

## 2014-05-01 NOTE — Telephone Encounter (Signed)
Pt mom thinks its pink eye. [facility] [address]

## 2014-05-01 NOTE — Telephone Encounter (Signed)
Clear eyes OTC 1-2 drops 4 times daily as needed

## 2014-05-01 NOTE — Telephone Encounter (Signed)
Patient did not go to work today and is requesting a work note.  Is it okay to give?

## 2014-05-02 ENCOUNTER — Encounter: Payer: Self-pay | Admitting: Family Medicine

## 2014-05-02 ENCOUNTER — Encounter: Payer: Self-pay | Admitting: *Deleted

## 2014-05-09 ENCOUNTER — Telehealth: Payer: Self-pay | Admitting: Internal Medicine

## 2014-05-09 NOTE — Telephone Encounter (Signed)
Patient states for the last 4 months, she has had stomach pain, nausea and weight loss. States the pain is LUQ and RLQ pain. She alternates with diarrhea or constipation. She reports nausea without vomiting and weight loss. States the symptoms are increasing in the last few weeks. Scheduled with Alonza Bogus, PA on 05/12/14 at 3:00 PM.

## 2014-05-12 ENCOUNTER — Ambulatory Visit: Payer: Self-pay | Admitting: Gastroenterology

## 2014-05-12 ENCOUNTER — Encounter: Payer: Self-pay | Admitting: Gastroenterology

## 2014-05-17 ENCOUNTER — Other Ambulatory Visit (INDEPENDENT_AMBULATORY_CARE_PROVIDER_SITE_OTHER): Payer: 59

## 2014-05-17 ENCOUNTER — Ambulatory Visit: Payer: Self-pay | Admitting: Gastroenterology

## 2014-05-17 ENCOUNTER — Ambulatory Visit (INDEPENDENT_AMBULATORY_CARE_PROVIDER_SITE_OTHER): Payer: 59 | Admitting: Gastroenterology

## 2014-05-17 ENCOUNTER — Encounter: Payer: Self-pay | Admitting: Gastroenterology

## 2014-05-17 VITALS — BP 132/80 | HR 97 | Ht 65.5 in | Wt 134.0 lb

## 2014-05-17 DIAGNOSIS — R1084 Generalized abdominal pain: Secondary | ICD-10-CM

## 2014-05-17 DIAGNOSIS — R197 Diarrhea, unspecified: Secondary | ICD-10-CM | POA: Insufficient documentation

## 2014-05-17 DIAGNOSIS — Z9889 Other specified postprocedural states: Secondary | ICD-10-CM

## 2014-05-17 DIAGNOSIS — K219 Gastro-esophageal reflux disease without esophagitis: Secondary | ICD-10-CM

## 2014-05-17 LAB — CBC WITH DIFFERENTIAL/PLATELET
BASOS ABS: 0 10*3/uL (ref 0.0–0.1)
Basophils Relative: 0.7 % (ref 0.0–3.0)
Eosinophils Absolute: 0.1 10*3/uL (ref 0.0–0.7)
Eosinophils Relative: 1.1 % (ref 0.0–5.0)
HEMATOCRIT: 43.2 % (ref 36.0–46.0)
HEMOGLOBIN: 14.9 g/dL (ref 12.0–15.0)
LYMPHS ABS: 1.5 10*3/uL (ref 0.7–4.0)
Lymphocytes Relative: 27.2 % (ref 12.0–46.0)
MCHC: 34.5 g/dL (ref 30.0–36.0)
MCV: 85 fl (ref 78.0–100.0)
MONOS PCT: 8.8 % (ref 3.0–12.0)
Monocytes Absolute: 0.5 10*3/uL (ref 0.1–1.0)
NEUTROS ABS: 3.4 10*3/uL (ref 1.4–7.7)
Neutrophils Relative %: 62.2 % (ref 43.0–77.0)
PLATELETS: 299 10*3/uL (ref 150.0–400.0)
RBC: 5.08 Mil/uL (ref 3.87–5.11)
RDW: 13.2 % (ref 11.5–15.5)
WBC: 5.5 10*3/uL (ref 4.0–10.5)

## 2014-05-17 LAB — COMPREHENSIVE METABOLIC PANEL
ALT: 14 U/L (ref 0–35)
AST: 22 U/L (ref 0–37)
Albumin: 4.3 g/dL (ref 3.5–5.2)
Alkaline Phosphatase: 40 U/L (ref 39–117)
BILIRUBIN TOTAL: 0.3 mg/dL (ref 0.2–1.2)
BUN: 15 mg/dL (ref 6–23)
CHLORIDE: 103 meq/L (ref 96–112)
CO2: 27 mEq/L (ref 19–32)
CREATININE: 0.97 mg/dL (ref 0.40–1.20)
Calcium: 9.9 mg/dL (ref 8.4–10.5)
GFR: 73.07 mL/min (ref >60.00)
Glucose, Bld: 93 mg/dL (ref 70–99)
Potassium: 4 mEq/L (ref 3.5–5.1)
Sodium: 138 mEq/L (ref 135–145)
Total Protein: 7.5 g/dL (ref 6.0–8.3)

## 2014-05-17 LAB — HIGH SENSITIVITY CRP: CRP, High Sensitivity: 4.03 mg/L (ref 0.000–5.000)

## 2014-05-17 LAB — IGA: IgA: 310 mg/dL (ref 68–378)

## 2014-05-17 LAB — FOLATE: Folate: 17 ng/mL (ref >5.9)

## 2014-05-17 LAB — SEDIMENTATION RATE: Sed Rate: 9 mm/hr (ref 0–22)

## 2014-05-17 LAB — VITAMIN B12: VITAMIN B 12: 542 pg/mL (ref 211–911)

## 2014-05-17 MED ORDER — DICYCLOMINE HCL 10 MG PO CAPS: 10.0000 mg | ORAL_CAPSULE | Freq: Two times a day (BID) | ORAL | Status: DC

## 2014-05-17 MED ORDER — RANITIDINE HCL 150 MG PO TABS: 150.0000 mg | ORAL_TABLET | Freq: Two times a day (BID) | ORAL | Status: DC

## 2014-05-17 MED ORDER — SACCHAROMYCES BOULARDII 250 MG PO CAPS: 250.0000 mg | ORAL_CAPSULE | Freq: Two times a day (BID) | ORAL | Status: DC

## 2014-05-17 NOTE — Progress Notes (Signed)
05/17/2014 Norma Harmon [id number] [birth year]   History of Present Illness:  This is a 29 year old female who is known to Dr. Olevia Perches.  We've seen her for several years now initially for suspected Crohn's disease and abnormal appearing terminal ileum on a CT scan of the abdomen and colonoscopy with biopsies in 2012. Symptoms were suggestive of a partial small bowel obstruction which led to surgery by Dr. Hassell Done. She underwent a right hemicolectomy in December 2012. The pathologic specimen showed nonspecific inflammation. She continued to have right lower quadrant abdominal pain and 6 months following the surgery underwent a small bowel capsule endoscopy showing questionable edema in the distal small bowel. She was given a trial of steroids which did not improve her abdominal pain. She subsequently underwent a colonoscopy in October 2013. Everything including the terminal ileum looked normal as well as the anastomosis. There were normal biopsies of the terminal ileum as well. She had an abnormal HIDA scan with ejection fraction of 4.6%, but symptoms were not consistent with biliary pain so she has not undergone cholecystectomy.  Most recent diagnosis was IBS when she was last seen in 08/2012.  She is here today with complaints of abdominal pain, nausea, alternating constipation and diarrhea, and bloating.  Her abdominal pain is all over, but mostly in her RLQ.  She says that over the weekend her pain had her hunched over with a heating pad.  Says that once over the weekend she passed a small amount of blood and mucus without stool.  She has alternating constipation and diarrhea, but has been with more looser stools recently.  Had one BM yesterday that was soft and mushy, no visibile blood.  She gets nervous when she gets a flare of her symptoms because she does not want to have another surgery.  She says that probiotics and anti-spasmodics may have helped her symptoms in the past.  Says that she's  always had stomach problems at least since she was in 8th grade.    Also complains of reflux despite taking prilosec daily.  She says that she feels like it is too strong and is suppressing her acid too much.     Current Medications, Allergies, Past Medical History, Past Surgical History, Family History and Social History were reviewed in [employer] record.   Physical Exam: BP 132/80 mmHg  Pulse 97  Ht 5' 5.5" (1.664 m)  Wt 134 lb (60.782 kg)  BMI 21.95 kg/m2  SpO2 98% General: Well developed white female in no acute distress Head: Normocephalic and atraumatic Eyes:  Sclerae anicteric, conjunctiva pink  Ears: Normal auditory acuity Lungs: Clear throughout to auscultation Heart: Regular rate and rhythm Abdomen: Soft, non-distended.  Normal bowel sounds.  Diffuse abdominal TTP > in the RLQ without R/R/G. Musculoskeletal: Symmetrical with no gross deformities  Extremities: No edema  Neurological: Alert oriented x 4, grossly non-focal Psychological:  Alert and cooperative. Normal mood and affect  Assessment and Recommendations: -29 year old female with history of abnormal colonoscopy findings suggestive of Crohn's disease and then SBO requiring surgery in 2012, but negative extensive evaluation since that time.  Current diagnosis of IBS, now with recurrent complaint of abdominal pain, nausea, diarrhea, etc.  Will check labs including CBC, CMP, sed rate, high-sensitivity CRP, vitamin B12 level, and celiac labs.  Will check CT scan of the abdomen and pelvis with contrast.  Will begin florastor probiotic BID and Bentyl 10 mg BID.   -GERD:  She  thinks that the prilosec is too strong.  Will discontinue prilosec and start Zantac 150 mg BID.    *Follow-up in 4-6 weeks.

## 2014-05-17 NOTE — Patient Instructions (Signed)
We have sent the following medications to your pharmacy for you to pick up at your convenience:  Florastor, Bentyl, Zantac.  Discontinue Prilosec  Your physician has requested that you go to the basement for lab work before leaving today  You have been scheduled for a CT scan of the abdomen and pelvis at [facility] ([address]---this is in the same building as [facility]).   You are scheduled on 05/22/2014 at 3:00pm. You should arrive 15 minutes prior to your appointment time for registration. Please follow the written instructions below on the day of your exam:  WARNING: IF YOU ARE ALLERGIC TO IODINE/X-RAY DYE, PLEASE NOTIFY RADIOLOGY IMMEDIATELY AT [phone]! YOU WILL BE GIVEN A 13 HOUR PREMEDICATION PREP.  1) Do not eat or drink anything after 10:00am (4 hours prior to your test) 2) You have been given 2 bottles of oral contrast to drink. The solution may taste better if refrigerated, but do NOT add ice or any other liquid to this solution. Shake  well before drinking.    Drink 1 bottle of contrast @ 1:00pm (2 hours prior to your exam)  Drink 1 bottle of contrast @ 2:00pm (1 hour prior to your exam)  You may take any medications as prescribed with a small amount of water except for the following: Metformin, Glucophage, Glucovance, Avandamet, Riomet, Fortamet, Actoplus Met, Janumet, Glumetza or Metaglip. The above medications must be held the day of the exam AND 48 hours after the exam.  The purpose of you drinking the oral contrast is to aid in the visualization of your intestinal tract. The contrast solution may cause some diarrhea. Before your exam is started, you will be given a small amount of fluid to drink. Depending on your individual set of symptoms, you may also receive an intravenous injection of x-ray contrast/dye. Plan on being at [facility] for 30 minutes or long, depending on the type of exam you are having performed.  If you have any  questions regarding your exam or if you need to reschedule, you may call the CT department at [phone] between the hours of 8:00 am and 5:00 pm, Monday-Friday.   Please follow up with Dr. Olevia Perches on 06/21/2014 at 8:15am.  ________________________________________________________________________

## 2014-05-17 NOTE — Progress Notes (Signed)
Discussed with Norma Harmon and agree with plans for CT scan, we could never be sure of Crohn's disease diagnosis.

## 2014-05-18 LAB — TISSUE TRANSGLUTAMINASE, IGA: Tissue Transglutaminase Ab, IgA: 1 U/mL (ref <4)

## 2014-05-22 ENCOUNTER — Ambulatory Visit (INDEPENDENT_AMBULATORY_CARE_PROVIDER_SITE_OTHER)
Admission: RE | Admit: 2014-05-22 | Discharge: 2014-05-22 | Disposition: A | Discharge status: Home / Self Care | Payer: 59 | Source: Ambulatory Visit | Attending: Gastroenterology | Admitting: Gastroenterology

## 2014-05-22 DIAGNOSIS — R197 Diarrhea, unspecified: Secondary | ICD-10-CM

## 2014-05-22 DIAGNOSIS — Z9889 Other specified postprocedural states: Secondary | ICD-10-CM

## 2014-05-22 DIAGNOSIS — R1084 Generalized abdominal pain: Secondary | ICD-10-CM

## 2014-05-22 MED ORDER — IOHEXOL 300 MG/ML  SOLN
100.0000 mL | Freq: Once | INTRAMUSCULAR | Status: AC | PRN
  Administered 2014-05-22: 100 mL via INTRAVENOUS

## 2014-06-13 ENCOUNTER — Encounter: Payer: Self-pay | Admitting: Family Medicine

## 2014-06-13 MED ORDER — AMPHETAMINE-DEXTROAMPHETAMINE 30 MG PO TABS: 30.0000 mg | ORAL_TABLET | Freq: Two times a day (BID) | ORAL | Status: DC

## 2014-06-13 NOTE — Addendum Note (Signed)
Addended by: Marian Sorrow on: 06/13/2014 01:31 PM   Modules accepted: Orders

## 2014-06-13 NOTE — Telephone Encounter (Signed)
Pt notified Rx's ready for pickup. Rx's printed and signed by Dr.K.

## 2014-06-20 ENCOUNTER — Telehealth: Payer: Self-pay | Admitting: Family Medicine

## 2014-06-20 NOTE — Telephone Encounter (Signed)
Pt has been sch for tomorrow with burchette

## 2014-06-20 NOTE — Telephone Encounter (Signed)
lmom for pt to cb concerning scheduling an appt at another location

## 2014-06-20 NOTE — Telephone Encounter (Signed)
Pt mom is calling because her daughter has sinus inf and going out of country [place] this Friday. [facility] [address]. Please advise

## 2014-06-21 ENCOUNTER — Ambulatory Visit: Payer: Self-pay | Admitting: Internal Medicine

## 2014-06-21 ENCOUNTER — Ambulatory Visit (INDEPENDENT_AMBULATORY_CARE_PROVIDER_SITE_OTHER): Payer: 59 | Admitting: Family Medicine

## 2014-06-21 ENCOUNTER — Encounter: Payer: Self-pay | Admitting: Family Medicine

## 2014-06-21 VITALS — BP 128/80 | HR 103 | Temp 97.8°F | Wt 137.0 lb

## 2014-06-21 DIAGNOSIS — R300 Dysuria: Secondary | ICD-10-CM

## 2014-06-21 DIAGNOSIS — J0100 Acute maxillary sinusitis, unspecified: Secondary | ICD-10-CM

## 2014-06-21 LAB — POCT URINALYSIS DIPSTICK
BILIRUBIN UA: NEGATIVE
Blood, UA: NEGATIVE
GLUCOSE UA: NEGATIVE
Ketones, UA: NEGATIVE
Leukocytes, UA: NEGATIVE
Nitrite, UA: NEGATIVE
PROTEIN UA: NEGATIVE
SPEC GRAV UA: 1.02
UROBILINOGEN UA: 0.2
pH, UA: 6

## 2014-06-21 MED ORDER — FLUCONAZOLE 150 MG PO TABS: 150.0000 mg | ORAL_TABLET | Freq: Once | ORAL | Status: DC

## 2014-06-21 MED ORDER — AMOXICILLIN-POT CLAVULANATE 875-125 MG PO TABS: 1.0000 | ORAL_TABLET | Freq: Two times a day (BID) | ORAL | Status: DC

## 2014-06-21 NOTE — Progress Notes (Signed)
   Subjective:    Patient ID: Norma Harmon, female    DOB: [birth year], 29 y.o.   MRN: [id number]  HPI Patient seen for the following issues  She's had about one month and possibly longer history of sinus pressure bilateral ethmoid and maxillary region. Intermittent headaches. Frequent nasal congestion. Bilateral air fullness. She's had some nonproductive cough. Possible low-grade fever past few days. Intermittent bloody nasal discharge and yellowish nasal discharge. She has tried multiple things including saline irrigation, Flonase, Mucinex all without improvement.  She has noticed slightly darker colored urine past few days with intermittent mild burning. She started Azo-Standard yesterday and symptoms are somewhat better today. No flank pain. No nausea or vomiting  Past Medical History  Diagnosis Date  . IBS (irritable bowel syndrome)   . Ovarian cyst   . Diarrhea   . Colitis   . Family history of malignant neoplasm of gastrointestinal tract   . PONV (postoperative nausea and vomiting)   . GERD (gastroesophageal reflux disease)   . Arthritis     lumbar back  . Anxiety   . ADHD (attention deficit hyperactivity disorder)     recently started on Adderol  . Skin cancer     abdomen and neck- states not melanoma  . Pernicious anemia   . Migraine headache   . Depression   . Allergic rhinitis    Past Surgical History  Procedure Laterality Date  . Wisdom tooth extraction    . Appendectomy  10/2008  . Spine surgery  08/2005    L5 - S1 fusion  . Colon resection  04/02/2011    Procedure: COLON RESECTION LAPAROSCOPIC;  Surgeon: Pedro Earls, MD;  Location: [facility];  Service: General;  Laterality: N/A;  Laparscopic  Assisted Ileocecostomy   . Hand surgery      left    reports that she quit smoking about 4 years ago. Her smoking use included Cigarettes. She has a .5 pack-year smoking history. She quit smokeless tobacco use about 3 years ago. She reports that she does not drink  alcohol or use illicit drugs. family history includes Breast cancer in her paternal grandmother; Cirrhosis in her maternal grandfather; Colon cancer in her paternal grandfather; Colon polyps in her paternal grandmother; Diabetes in her maternal grandmother; Heart disease in her maternal grandmother. Allergies  Allergen Reactions  . Percocet [Oxycodone-Acetaminophen] Swelling  . Soy Allergy     Abdominal pains      Review of Systems  Constitutional: Positive for fever and fatigue. Negative for chills.  HENT: Positive for congestion and sinus pressure.   Respiratory: Positive for cough.   Neurological: Positive for headaches.       Objective:   Physical Exam  Constitutional: She appears well-developed and well-nourished.  HENT:  Right Ear: External ear normal.  Left Ear: External ear normal.  Mouth/Throat: Oropharynx is clear and moist.  Neck: Neck supple.  Cardiovascular: Normal rate and regular rhythm.   Pulmonary/Chest: Effort normal and breath sounds normal. No respiratory distress. She has no wheezes. She has no rales.  Lymphadenopathy:    She has no cervical adenopathy.          Assessment & Plan:  #1 probable bilateral maxillary and ethmoid sinusitis. Given duration of symptoms start Augmentin. Fluconazole 150 mg as needed for yeast vaginitis. #2 dysuria. Urine dipstick is completely normal. Stay well-hydrated and observe

## 2014-06-21 NOTE — Progress Notes (Signed)
Pre visit review using our clinic review tool, if applicable. No additional management support is needed unless otherwise documented below in the visit note. 

## 2014-06-21 NOTE — Patient Instructions (Signed)

## 2014-07-18 ENCOUNTER — Encounter: Payer: Self-pay | Admitting: Internal Medicine

## 2014-07-18 ENCOUNTER — Ambulatory Visit (INDEPENDENT_AMBULATORY_CARE_PROVIDER_SITE_OTHER): Payer: 59 | Admitting: Internal Medicine

## 2014-07-18 VITALS — BP 124/70 | HR 82 | Ht 65.5 in | Wt 136.5 lb

## 2014-07-18 DIAGNOSIS — R1084 Generalized abdominal pain: Secondary | ICD-10-CM | POA: Diagnosis not present

## 2014-07-18 DIAGNOSIS — K219 Gastro-esophageal reflux disease without esophagitis: Secondary | ICD-10-CM

## 2014-07-18 DIAGNOSIS — K589 Irritable bowel syndrome without diarrhea: Secondary | ICD-10-CM

## 2014-07-18 MED ORDER — DICYCLOMINE HCL 10 MG PO CAPS: 10.0000 mg | ORAL_CAPSULE | Freq: Two times a day (BID) | ORAL | Status: DC

## 2014-07-18 MED ORDER — RANITIDINE HCL 150 MG PO TABS: 150.0000 mg | ORAL_TABLET | Freq: Two times a day (BID) | ORAL | Status: DC

## 2014-07-18 NOTE — Progress Notes (Signed)
Norma Harmon 1985/12/18 [id number]  Note: This dictation was prepared with Dragon digital system. Any transcriptional errors that result from this procedure are unintentional.   History of Present Illness: This is a  29 year old white female seen in January 2016 by Norma Harmon  for constipation and  abdominal pain. She has history of abnormal CT scan of the abdomen as well as colonoscopy suggestive of Crohn's disease. She has a history of small bowel obstruction requiring right hemicolectomy by Dr. Hassell Done in 2012. Subsequent studies however were more consistent with an irritable bowel syndrome. We have not seen her for several years.Aftyer seeing [name] in January 2016, CT scan of the abdomen showed no active process. Her lab work was all normal. She is much improved on Bentyl 10 mg 3 times a day. She is evaluated  for endometriosis by her gynecologist. 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 IBS (irritable bowel syndrome)   . Ovarian cyst   . Diarrhea   . Colitis   . Family history of malignant neoplasm of gastrointestinal tract   . PONV (postoperative nausea and vomiting)   . GERD (gastroesophageal reflux disease)   . Arthritis     lumbar back  . Anxiety   . ADHD (attention deficit hyperactivity disorder)     recently started on Adderol  . Skin cancer     abdomen and neck- states not melanoma  . Pernicious anemia   . Migraine headache   . Depression   . Allergic rhinitis     Past Surgical History  Procedure Laterality Date  . Wisdom tooth extraction    . Appendectomy  10/2008  . Spine surgery  08/2005    L5 - S1 fusion  . Colon resection  04/02/2011    Procedure: COLON RESECTION LAPAROSCOPIC;  Surgeon: Pedro Earls, MD;  Location: [facility];  Service: General;   Laterality: N/A;  Laparscopic  Assisted Ileocecostomy   . Hand surgery      left    Allergies  Allergen Reactions  . Percocet [Oxycodone-Acetaminophen] Swelling  . Soy Allergy     Abdominal pains    Family history and social history have been reviewed.  Review of Systems:  Stable weight. crampy lower abdominal pain. Constipation. Denies rectal bleeding  The remainder of the 10 point ROS is negative except as outlined in the H&P  Physical Exam: General Appearance Well developed, in no distress Eyes  Non icteric  HEENT  Non traumatic, normocephalic  Mouth No lesion, tongue papillated, no cheilosis Neck Supple without adenopathy, thyroid not enlarged, no carotid bruits, no JVD Lungs Clear to auscultation bilaterally COR Normal S1, normal S2, regular rhythm, no murmur, quiet precordium Abdomen  Soft with tattoo in the right lower quadrant. Normoactive bowel sounds. Well-healed surgical scars in right upper quadrant. Very tender along sigmoid colon but no palpable mass or rebound. Rectal  Noted done Extremities  No pedal edema Skin No lesions Neurological Alert and oriented x 3 Psychological Normal mood and affect  Assessment and Plan:   recurrent lower abdominal pain with negative workup including lab work and CT scan of the abdomen in a patient who,  at one point, was suspected to have Crohn's disease but her symptoms are more consistent with IBS. She has  been improved on birth  control pill for suspected endometriosis as well as on dicyclomine for crampy abdominal pain. She will continue on the dicyclomine. Ranitidine 150 mg a day for reflux and we will restart MiraLAX 9-17 g on when necessary basis.Return prn.    Delfin Edis 07/18/2014

## 2014-07-18 NOTE — Patient Instructions (Addendum)
We have sent the following medications to your pharmacy for you to pick up at your convenience:  Dicyclomine and Ranitidine Dr Sherren Mocha

## 2014-08-03 ENCOUNTER — Encounter: Payer: Self-pay | Admitting: Family Medicine

## 2014-09-01 ENCOUNTER — Encounter: Payer: Self-pay | Admitting: *Deleted

## 2014-09-01 ENCOUNTER — Other Ambulatory Visit: Payer: Self-pay | Admitting: *Deleted

## 2014-09-01 MED ORDER — AMPHETAMINE-DEXTROAMPHETAMINE 30 MG PO TABS: 30.0000 mg | ORAL_TABLET | Freq: Two times a day (BID) | ORAL | Status: DC

## 2014-09-01 NOTE — Telephone Encounter (Signed)
Rx ready for pick up. Message sent to mychart.

## 2014-09-07 ENCOUNTER — Encounter: Payer: Self-pay | Admitting: Family Medicine

## 2014-10-16 ENCOUNTER — Encounter: Payer: Self-pay | Admitting: Family Medicine

## 2014-10-27 ENCOUNTER — Other Ambulatory Visit: Payer: Self-pay | Admitting: *Deleted

## 2014-10-27 MED ORDER — AMPHETAMINE-DEXTROAMPHETAMINE 30 MG PO TABS: 30.0000 mg | ORAL_TABLET | Freq: Two times a day (BID) | ORAL | Status: DC

## 2014-10-27 NOTE — Telephone Encounter (Signed)
Rx ready for pick up and patient is aware 

## 2015-01-16 ENCOUNTER — Other Ambulatory Visit: Payer: Self-pay | Admitting: Internal Medicine

## 2015-01-19 ENCOUNTER — Encounter: Payer: Self-pay | Admitting: Family Medicine

## 2015-01-19 NOTE — Telephone Encounter (Signed)
Norma Harmon pt sent request top dr [name] on 9/20 b/c her was the last one to fill. to needs her  amphetamine-dextroamphetamine (ADDERALL) 30 MG tablet  Pt will be out on Monday and cannot pu that day due to work. Would like today pls

## 2015-01-25 MED ORDER — AMPHETAMINE-DEXTROAMPHETAMINE 30 MG PO TABS: 30.0000 mg | ORAL_TABLET | Freq: Two times a day (BID) | ORAL | Status: DC

## 2015-01-29 ENCOUNTER — Telehealth: Payer: Self-pay | Admitting: Family Medicine

## 2015-01-29 NOTE — Telephone Encounter (Signed)
Pt will keep appointment with dr Sherren Mocha

## 2015-01-29 NOTE — Telephone Encounter (Signed)
Pt states she has knot on upper right clavicle area near her throat.  Pt has previously had some pain in that area, but the knot has just appeared. Pt states it feels like sometimes it presses on her windpipe. Pt would like to see Dr Norma Harmon asap. Is it ok to work her in today or tomorrow?

## 2015-01-29 NOTE — Telephone Encounter (Signed)
Dr Maudie Mercury has openings this afternoon

## 2015-01-30 ENCOUNTER — Ambulatory Visit (INDEPENDENT_AMBULATORY_CARE_PROVIDER_SITE_OTHER): Payer: 59 | Admitting: Family Medicine

## 2015-01-30 ENCOUNTER — Encounter: Payer: Self-pay | Admitting: Family Medicine

## 2015-01-30 VITALS — Temp 98.3°F | Wt 153.0 lb

## 2015-01-30 DIAGNOSIS — R609 Edema, unspecified: Secondary | ICD-10-CM | POA: Diagnosis not present

## 2015-01-30 DIAGNOSIS — Z23 Encounter for immunization: Secondary | ICD-10-CM | POA: Diagnosis not present

## 2015-01-30 DIAGNOSIS — R300 Dysuria: Secondary | ICD-10-CM | POA: Diagnosis not present

## 2015-01-30 DIAGNOSIS — F901 Attention-deficit hyperactivity disorder, predominantly hyperactive type: Secondary | ICD-10-CM | POA: Diagnosis not present

## 2015-01-30 MED ORDER — HYDROCHLOROTHIAZIDE 25 MG PO TABS: 25.0000 mg | ORAL_TABLET | Freq: Every day | ORAL | Status: DC | PRN

## 2015-01-30 MED ORDER — FLUTICASONE PROPIONATE 50 MCG/ACT NA SUSP: 2.0000 | Freq: Every day | NASAL | Status: DC

## 2015-01-30 NOTE — Progress Notes (Signed)
   Subjective:    Patient ID: Norma Harmon, female    DOB: 06-24-1985, 29 y.o.   MRN: [id number]  HPI Norma Harmon is a 29 year old single female smoker who comes in today for evaluation of a knot in her neck  She says for the past year she's felt a knot in her neck. She points to her right clavicle as a source of her discomfort. Since been going on for about a year.  She also needs a refill of her Adderall which she takes 30 mg twice a day for adult ADD  She also needs a refill the hydrochlorothiazide which she takes for peripheral edema and fluid retention.  Because of her chronic pain issues she's going to the pain clinic.   Review of Systems Review of systems otherwise negative    Objective:   Physical Exam  Well-developed well-nourished female no acute distress vital signs stable she's afebrile examination of the neck it appears normal thyroid normal no adenopathy. There is some asymmetry her right clavicle little bigger than the left.      Assessment & Plan:  Sensation in her neck underneath etiology,,,,,, reassured I can feel nothing abnormal in her anatomy  Adult ADD,,,,,,,, refill Adderall

## 2015-01-30 NOTE — Patient Instructions (Signed)
Continue current medication return when necessary

## 2015-01-30 NOTE — Progress Notes (Signed)
Pre visit review using our clinic review tool, if applicable. No additional management support is needed unless otherwise documented below in the visit note. 

## 2015-01-31 ENCOUNTER — Ambulatory Visit: Payer: Self-pay | Admitting: Family Medicine

## 2015-03-13 ENCOUNTER — Other Ambulatory Visit: Payer: Self-pay | Admitting: Family Medicine

## 2015-04-05 ENCOUNTER — Ambulatory Visit: Payer: Self-pay | Admitting: Family Medicine

## 2015-04-20 ENCOUNTER — Encounter: Payer: Self-pay | Admitting: Family Medicine

## 2015-04-24 MED ORDER — AMPHETAMINE-DEXTROAMPHETAMINE 30 MG PO TABS: 30.0000 mg | ORAL_TABLET | Freq: Two times a day (BID) | ORAL | Status: DC

## 2015-04-24 NOTE — Telephone Encounter (Signed)
Pt called back today regarding this message saying her last dose of Adderall is today. She's wondering if she'll be able to pick up the Rx before we close. Please give her a phone call regarding this.  Pt's ph# [phone] Thank you.

## 2015-04-25 NOTE — Telephone Encounter (Signed)
Pt notified Rx ready for pickup. Rx printed and signed by Dr. Burchette.  

## 2015-05-10 ENCOUNTER — Encounter: Payer: Self-pay | Admitting: Physician Assistant

## 2015-05-10 ENCOUNTER — Ambulatory Visit (INDEPENDENT_AMBULATORY_CARE_PROVIDER_SITE_OTHER): Payer: 59 | Admitting: Emergency Medicine

## 2015-05-10 ENCOUNTER — Emergency Department (HOSPITAL_COMMUNITY)
Admission: EM | Admit: 2015-05-10 | Discharge: 2015-05-10 | Discharge status: Against Medical Advice | Payer: 59 | Attending: Emergency Medicine | Admitting: Emergency Medicine

## 2015-05-10 ENCOUNTER — Emergency Department (INDEPENDENT_AMBULATORY_CARE_PROVIDER_SITE_OTHER): Payer: 59

## 2015-05-10 VITALS — BP 140/84 | HR 122 | Temp 97.5°F | Resp 16 | Ht 66.0 in | Wt 153.0 lb

## 2015-05-10 DIAGNOSIS — S61217A Laceration without foreign body of left little finger without damage to nail, initial encounter: Secondary | ICD-10-CM

## 2015-05-10 DIAGNOSIS — W1839XA Other fall on same level, initial encounter: Secondary | ICD-10-CM | POA: Insufficient documentation

## 2015-05-10 DIAGNOSIS — R030 Elevated blood-pressure reading, without diagnosis of hypertension: Secondary | ICD-10-CM | POA: Diagnosis not present

## 2015-05-10 DIAGNOSIS — S61219A Laceration without foreign body of unspecified finger without damage to nail, initial encounter: Secondary | ICD-10-CM | POA: Diagnosis not present

## 2015-05-10 DIAGNOSIS — Y9389 Activity, other specified: Secondary | ICD-10-CM | POA: Diagnosis not present

## 2015-05-10 DIAGNOSIS — S61412A Laceration without foreign body of left hand, initial encounter: Secondary | ICD-10-CM | POA: Diagnosis not present

## 2015-05-10 DIAGNOSIS — Y9289 Other specified places as the place of occurrence of the external cause: Secondary | ICD-10-CM | POA: Diagnosis not present

## 2015-05-10 DIAGNOSIS — M25562 Pain in left knee: Secondary | ICD-10-CM | POA: Diagnosis not present

## 2015-05-10 DIAGNOSIS — M545 Low back pain: Secondary | ICD-10-CM | POA: Diagnosis not present

## 2015-05-10 DIAGNOSIS — W19XXXA Unspecified fall, initial encounter: Secondary | ICD-10-CM

## 2015-05-10 DIAGNOSIS — Y998 Other external cause status: Secondary | ICD-10-CM | POA: Insufficient documentation

## 2015-05-10 DIAGNOSIS — IMO0001 Reserved for inherently not codable concepts without codable children: Secondary | ICD-10-CM

## 2015-05-10 DIAGNOSIS — R0989 Other specified symptoms and signs involving the circulatory and respiratory systems: Secondary | ICD-10-CM | POA: Diagnosis not present

## 2015-05-10 MED ORDER — NAPROXEN 500 MG PO TABS: 500.0000 mg | ORAL_TABLET | Freq: Two times a day (BID) | ORAL | Status: AC

## 2015-05-10 MED ORDER — TIZANIDINE HCL 2 MG PO CAPS: 2.0000 mg | ORAL_CAPSULE | Freq: Three times a day (TID) | ORAL | Status: DC

## 2015-05-10 NOTE — ED Notes (Signed)
No answer from waiting room.

## 2015-05-10 NOTE — ED Notes (Signed)
Called for triage x1-no answer. 

## 2015-05-10 NOTE — Progress Notes (Signed)
05/11/2015 8:38 AM   DOB: 03/24/1986 / MRN: [id number]  SUBJECTIVE:  Norma Harmon is a 30 y.o. female presenting for the evaluation of joint pain after falling from a height of roughly 2 feet.  Reports she was washing her truck and the stool she was using slipped.  Reports that she fell in between her truck and the attached trailer. Has left hand pain and a small laceration on the left pinky.  Complains of generalized knee pain, midline low back pain without radiation, and left shoulder pain.  Reports that her left hand and her knee took the brunt of the fall.  She denies head trauma.      Immunization History  Administered Date(s) Administered  . Influenza Split 02/18/2012  . Influenza,inj,Quad PF,36+ Mos 01/30/2015  . Influenza-Unspecified 02/24/2014  . Tdap 01/28/2011, 08/08/2013     She is allergic to percocet and soy allergy.   She  has a past medical history of IBS (irritable bowel syndrome); Ovarian cyst; Diarrhea; Colitis; Family history of malignant neoplasm of gastrointestinal tract; PONV (postoperative nausea and vomiting); GERD (gastroesophageal reflux disease); Arthritis; Anxiety; ADHD (attention deficit hyperactivity disorder); Skin cancer; Pernicious anemia; Migraine headache; Depression; and Allergic rhinitis.    She  reports that she quit smoking about 5 years ago. Her smoking use included Cigarettes. She has a .5 pack-year smoking history. She quit smokeless tobacco use about 4 years ago. She reports that she does not drink alcohol or use illicit drugs. She  reports that she currently engages in sexual activity. She reports using the following method of birth control/protection: Other-see comments. The patient  has past surgical history that includes Wisdom tooth extraction; Appendectomy (10/2008); Spine surgery (08/2005); Colon resection (04/02/2011); Hand surgery; Fracture surgery; and Small intestine surgery.  Her family history includes Breast cancer in her paternal  grandmother; Cirrhosis in her maternal grandfather; Colon cancer in her paternal grandfather; Colon polyps in her paternal grandmother; Diabetes in her maternal grandmother; Heart disease in her maternal grandmother.  Review of Systems  Constitutional: Negative for fever and chills.  Eyes: Negative for blurred vision.  Respiratory: Negative for cough and shortness of breath.   Cardiovascular: Negative for chest pain.  Gastrointestinal: Negative for nausea and abdominal pain.  Genitourinary: Negative for dysuria, urgency and frequency.  Musculoskeletal: Negative for myalgias.  Skin: Negative for rash.  Neurological: Negative for dizziness, tingling and headaches.  Psychiatric/Behavioral: Negative for depression. The patient is not nervous/anxious.     Problem list and medications reviewed and updated by myself where necessary, and exist elsewhere in the encounter.   OBJECTIVE:  BP 140/84 mmHg  Pulse 122  Temp(Src) 97.5 F (36.4 C) (Oral)  Resp 16  Ht 5\' 6"  (1.676 m)  Wt 153 lb (69.4 kg)  BMI 24.71 kg/m2  SpO2 98%  Physical Exam  Musculoskeletal:       Left wrist: She exhibits bony tenderness. She exhibits normal range of motion, no swelling, no effusion, no deformity and no laceration.       Right knee: She exhibits no swelling, no effusion, no ecchymosis, no laceration, no erythema, no LCL laxity, no bony tenderness and no MCL laxity. Tenderness found.       Lumbar back: She exhibits tenderness, pain and spasm. She exhibits normal range of motion, no bony tenderness, no deformity and no laceration.       Hands:   Risk and benefits discussed and verbal consent obtained. Anesthetic allergies reviewed. Patient anesthetized using 1:1 mix of 2%  lidocaine with epi and Marcaine. The wound was cleansed thoroughly with soap and water. Sterile prep and drape. Wound closed with 3 throws using 4-0 Ethilon suture material. Hemostasis achieved. Mupirocin applied to the wound and bandage  placed. The patient tolerated well. Wound instructions were provided and the patient is to return in 6 days for suture removal.  [facility] reading (PRIMARY) by  Dr. Ouida Sills:  Negative for bony abnormality.   No results found for this or any previous visit (from the past 48 hour(s)).  ASSESSMENT AND PLAN  Norma Harmon was seen today for laceration and knee pain.  Diagnoses and all orders for this visit:  Fall, initial encounter: Negative for head trauma.  TDAP up to date.  PE and Rads reassuring.  Will see her in clinic in 6 days for recheck and suture removal.  Naprosyn and Zanaflex for now.  Patient written out of work until the 16th.  -     DG Lumbar Spine Complete; Future -     DG Hand Complete Left; Future -     DG Knee Complete 4 Views Left; Future  Laceration of finger, initial encounter -     Laceration repair; Future  Elevated heart rate and blood pressure: Most likely secondary to problem one.    Other orders -     naproxen (NAPROSYN) 500 MG tablet; Take 1 tablet (500 mg total) by mouth 2 (two) times daily with a meal. Do not take ibuprofen, aleve, or aspirin with this medication. -     tizanidine (ZANAFLEX) 2 MG capsule; Take 1-2 capsules (2-4 mg total) by mouth 3 (three) times daily.    The patient was advised to call or return to clinic if she does not see an improvement in symptoms or to seek the care of the closest emergency department if she worsens with the above plan.   Norma Harmon, MHS, PA-C [facility] 05/11/2015 8:38 AM

## 2015-05-10 NOTE — Patient Instructions (Signed)
WOUND CARE ?Please return in 6 days to have your stitches/staples removed or sooner if you have concerns. ? Keep area clean and dry for 24 hours. Do not remove bandage, if applied. ? After 24 hours, remove bandage and wash wound gently with mild soap and warm water. Reapply a new bandage after cleaning wound, if directed. ? Continue daily cleansing with soap and water until stitches/staples are removed. ? Do not apply any ointments or creams to the wound while stitches/staples are in place, as this may cause delayed healing. ? Notify the office if you experience any of the following signs of infection: Swelling, redness, pus drainage, streaking, fever >101.0 F ? Notify the office if you experience excessive bleeding that does not stop after 15-20 minutes of constant, firm pressure. ? ?

## 2015-05-12 NOTE — Progress Notes (Signed)
  Medical screening examination/treatment/procedure(s) were performed by non-physician practitioner and as supervising physician I was immediately available for consultation/collaboration.     

## 2015-07-04 ENCOUNTER — Telehealth: Payer: Self-pay | Admitting: Family Medicine

## 2015-07-04 NOTE — Telephone Encounter (Signed)
Pt said her boyfriend has the flu and she leaving for [place] on Sat and is asking if some TAMIFLU can be called in    [facility] [address]

## 2015-07-04 NOTE — Telephone Encounter (Signed)
Pt is aware rachel not in office this afternoon and will be back tomorrow.

## 2015-07-05 ENCOUNTER — Telehealth: Payer: Self-pay | Admitting: Family Medicine

## 2015-07-05 NOTE — Telephone Encounter (Signed)
Patient Name: Norma Harmon DOB: November 02, 1985 Initial Comment Caller states her daughter is going out of country tomorrow. She has been exposed to the flu and would like a rx for the tamiflu in case she comes down with it while she is gone. Nurse Assessment Nurse: Marcelline Deist, RN, Lynda Date/Time (Eastern Time): 07/05/2015 12:17:45 PM Confirm and document reason for call. If symptomatic, describe symptoms. You must click the next button to save text entered. ---Caller states her daughter is going out of country tomorrow. She has been exposed to the "flu" a few days ago and would like a rx for Tamiflu in case she comes down with it while she is gone. No symptoms now. Uses [facility] on [address] [phone]. NKDA Has the patient traveled out of the country within the last 30 days? ---Not Applicable Does the patient have any new or worsening symptoms? ---No Please document clinical information provided and list any resource used. Caller is not with patient. States she has no symptoms now. Triage not done. Caller states her dtr takes rx for ADHD and has a hx of stomach surgery where they took 4-6" of her intestine out. This is caller's best contact #. Advised to check back with office this afternoon if she has not heard back. caller states patient's Dr. is not in office today. Guidelines Guideline Title Affirmed Question Affirmed Notes Final Disposition User Clinical Call [place], RN, Kermit Balo

## 2015-07-05 NOTE — Telephone Encounter (Signed)
Left message on machine for patient that Dr Sherren Mocha does not prescribe Tamiflu and he is out of the office.  Patient is welcome to call back and schedule an appointment with a different provider.

## 2015-07-05 NOTE — Telephone Encounter (Signed)
See other message

## 2015-07-23 ENCOUNTER — Telehealth: Payer: Self-pay | Admitting: Family Medicine

## 2015-07-23 MED ORDER — AMPHETAMINE-DEXTROAMPHETAMINE 30 MG PO TABS: 30.0000 mg | ORAL_TABLET | Freq: Two times a day (BID) | ORAL | Status: DC

## 2015-07-23 NOTE — Telephone Encounter (Signed)
Pt needs new generic adderall xr 30 mg twice a a day

## 2015-07-23 NOTE — Telephone Encounter (Signed)
rx ready for pick up and Left message on machine for patient 

## 2015-09-10 ENCOUNTER — Encounter: Payer: Self-pay | Admitting: Family Medicine

## 2015-09-10 ENCOUNTER — Encounter: Payer: Self-pay | Admitting: *Deleted

## 2015-09-10 ENCOUNTER — Ambulatory Visit (INDEPENDENT_AMBULATORY_CARE_PROVIDER_SITE_OTHER): Payer: 59 | Admitting: Family Medicine

## 2015-09-10 VITALS — BP 140/90 | HR 99 | Temp 98.2°F | Ht 66.0 in | Wt 156.3 lb

## 2015-09-10 DIAGNOSIS — J309 Allergic rhinitis, unspecified: Secondary | ICD-10-CM

## 2015-09-10 DIAGNOSIS — I10 Essential (primary) hypertension: Secondary | ICD-10-CM | POA: Diagnosis not present

## 2015-09-10 DIAGNOSIS — J029 Acute pharyngitis, unspecified: Secondary | ICD-10-CM

## 2015-09-10 DIAGNOSIS — H6981 Other specified disorders of Eustachian tube, right ear: Secondary | ICD-10-CM

## 2015-09-10 LAB — POCT RAPID STREP A (OFFICE): Rapid Strep A Screen: NEGATIVE

## 2015-09-10 MED ORDER — FLUTICASONE PROPIONATE 50 MCG/ACT NA SUSP: 1.0000 | Freq: Every day | NASAL | Status: DC

## 2015-09-10 NOTE — Progress Notes (Signed)
Pre visit review using our clinic review tool, if applicable. No additional management support is needed unless otherwise documented below in the visit note. 

## 2015-09-10 NOTE — Addendum Note (Signed)
Addended by: Lahoma Crocker A on: 09/10/2015 11:07 AM   Modules accepted: Orders

## 2015-09-10 NOTE — Progress Notes (Signed)
HPI:  Norma Harmon is a 30 yo patient of Dr. Sherren Mocha, here for an acute visit for a sore throat: -started: last night, but has chronic allergy issues - taking zyrtec but needs refill on her flonase - reports is cheaper with prescription -symptoms:nasal congestion, sore throat, cough, ear pressure R -denies:fever, SOB, NVD, tooth pain -has tried: zyrtec -sick contacts/travel/risks: coworker with strep last week  -Hx of: allergies  HTN: -Elevated on arrival -On hydrochlorothiazide with PCP,takes at night -eating fast food more often lately -Denies:CP, SOB, DOE -reports this is normal reading for her an PCP ok with it  ROS: See pertinent positives and negatives per HPI.  Past Medical History  Diagnosis Date  . IBS (irritable bowel syndrome)   . Ovarian cyst   . Diarrhea   . Colitis   . Family history of malignant neoplasm of gastrointestinal tract   . PONV (postoperative nausea and vomiting)   . GERD (gastroesophageal reflux disease)   . Arthritis     lumbar back  . Anxiety   . ADHD (attention deficit hyperactivity disorder)     recently started on Adderol  . Skin cancer     abdomen and neck- states not melanoma  . Pernicious anemia   . Migraine headache   . Depression   . Allergic rhinitis     Past Surgical History  Procedure Laterality Date  . Wisdom tooth extraction    . Appendectomy  10/2008  . Spine surgery  08/2005    L5 - S1 fusion  . Colon resection  04/02/2011    Procedure: COLON RESECTION LAPAROSCOPIC;  Surgeon: Pedro Earls, MD;  Location: [facility];  Service: General;  Laterality: N/A;  Laparscopic  Assisted Ileocecostomy   . Hand surgery      left  . Fracture surgery    . Small intestine surgery      Family History  Problem Relation Age of Onset  . Colon cancer Paternal Grandfather   . Breast cancer Paternal Grandmother   . Colon polyps Paternal Grandmother   . Diabetes Maternal Grandmother   . Heart disease Maternal Grandmother   . Cirrhosis  Maternal Grandfather     heavy drinker    Social History   Social History  . Marital Status: Divorced    Spouse Name: N/A  . Number of Children: 0  . Years of Education: N/A   Occupational History  . Corporate investment banker    Social History Main Topics  . Smoking status: Former Smoker -- 0.25 packs/day for 2 years    Types: Cigarettes    Quit date: 04/28/2010  . Smokeless tobacco: Former Systems developer    Quit date: 01/29/2011  . Alcohol Use: No     Comment: rarely  . Drug Use: No  . Sexual Activity: Yes    Birth Control/ Protection: Other-see comments     Comment: nuvaring   Other Topics Concern  . None   Social History Narrative     Current outpatient prescriptions:  .  amphetamine-dextroamphetamine (ADDERALL) 30 MG tablet, Take 1 tablet by mouth 2 (two) times daily., Disp: 60 tablet, Rfl: 0 .  amphetamine-dextroamphetamine (ADDERALL) 30 MG tablet, Take 1 tablet by mouth 2 (two) times daily., Disp: 60 tablet, Rfl: 0 .  amphetamine-dextroamphetamine (ADDERALL) 30 MG tablet, Take 1 tablet by mouth 2 (two) times daily., Disp: 60 tablet, Rfl: 0 .  cyanocobalamin (,VITAMIN B-12,) 1000 MCG/ML injection, Inject 1 mL (1,000 mcg total) into the muscle once., Disp: 30 mL,  Rfl: 3 .  hydrochlorothiazide (HYDRODIURIL) 25 MG tablet, Take 1 tablet (25 mg total) by mouth daily as needed (swelling)., Disp: 100 tablet, Rfl: 3 .  HYDROcodone-acetaminophen (NORCO/VICODIN) 5-325 MG per tablet, Take 1 tablet by mouth every 6 (six) hours as needed for moderate pain., Disp: 30 tablet, Rfl: 0 .  norethindrone-ethinyl estradiol (JUNEL FE,GILDESS FE,LOESTRIN FE) 1-20 MG-MCG tablet, Take 1 tablet by mouth at bedtime. , Disp: , Rfl:  .  SYRINGE-NEEDLE, DISP, 3 ML 25G X 5/8" 3 ML MISC, 1 each by Does not apply route every 30 (thirty) days., Disp: 100 each, Rfl: 0 .  tizanidine (ZANAFLEX) 2 MG capsule, Take 1-2 capsules (2-4 mg total) by mouth 3 (three) times daily., Disp: 30 capsule, Rfl: 0 .  fluticasone (FLONASE)  50 MCG/ACT nasal spray, Place 1 spray into both nostrils daily., Disp: 16 g, Rfl: 6  EXAM:  Filed Vitals:   09/10/15 1043  BP: 140/90  Pulse: 99  Temp: 98.2 F (36.8 C)    Body mass index is 25.24 kg/(m^2).  GENERAL: vitals reviewed and listed above, alert, oriented, appears well hydrated and in no acute distress  HEENT: atraumatic, conjunttiva clear, no obvious abnormalities on inspection of external nose and ears, normal appearance of ear canals and TMs except for clear effusion on the right, clear nasal congestion, mild post oropharyngeal erythema with PND, no tonsillar edema or exudate, no sinus TTP  NECK: no obvious masses on inspection  LUNGS: clear to auscultation bilaterally, no wheezes, rales or rhonchi, good air movement  CV: HRRR, no peripheral edema  MS: moves all extremities without noticeable abnormality  PSYCH: pleasant and cooperative, no obvious depression or anxiety  ASSESSMENT AND PLAN:  Discussed the following assessment and plan:  Allergic rhinitis, unspecified allergic rhinitis type  ETD (eustachian tube dysfunction), right  Essential hypertension  -given HPI and exam findings today, a serious infection or illness is unlikely. We discussed potential etiologies, with allergic rhinitis eustachian tube dysfunction being most likely, and advised supportive care and monitoring. We discussed treatment side effects, likely course, antibiotic misuse, transmission, and signs of developing a serious illness. Advised adding back her Flonase, short course nasal decongestion continued Zyrtec. -of course, we advised to return or notify a doctor immediately if symptoms worsen or persist or new concerns arise. -Advised a healthy lifestyle with diet and regular exercise and reevaluation of her blood pressure with her primary doctor.     Patient Instructions  Before you leave: -Rapid strep screen -Schedule follow-up with Dr. Sherren Mocha in 3-4 months when he returns at the  end of summer to follow up on your blood pressure.  Please start the Flonase and use 2 spray each nostril daily for 1 month, then 1 spray each nostril daily.   Can use Afrin nasal spray twice daily for 3 days. Do not use longer than 3 days.  Continue your Zyrtec daily.  May return to work.  We recommend the following healthy lifestyle measures: - eat a healthy whole foods diet consisting of regular small meals composed of vegetables, fruits, beans, nuts, seeds, healthy meats such as white chicken and fish and whole grains.  - avoid sweets, white starchy foods, fried foods, fast food, processed foods, sodas, red meet and other fattening foods.  - get a least 150-300 minutes of aerobic exercise per week.       Colin Benton R.

## 2015-09-10 NOTE — Patient Instructions (Addendum)
Before you leave: -Rapid strep screen -Schedule follow-up with Dr. Sherren Mocha in 3-4 months when he returns at the end of summer to follow up on your blood pressure.  Please start the Flonase and use 2 spray each nostril daily for 1 month, then 1 spray each nostril daily.   Can use Afrin nasal spray twice daily for 3 days. Do not use longer than 3 days.  Continue your Zyrtec daily.  May return to work.  We recommend the following healthy lifestyle measures: - eat a healthy whole foods diet consisting of regular small meals composed of vegetables, fruits, beans, nuts, seeds, healthy meats such as white chicken and fish and whole grains.  - avoid sweets, white starchy foods, fried foods, fast food, processed foods, sodas, red meet and other fattening foods.  - get a least 150-300 minutes of aerobic exercise per week.

## 2015-10-17 ENCOUNTER — Other Ambulatory Visit: Payer: Self-pay | Admitting: Family Medicine

## 2015-10-22 ENCOUNTER — Encounter: Payer: Self-pay | Admitting: Family Medicine

## 2015-10-22 MED ORDER — AMPHETAMINE-DEXTROAMPHETAMINE 30 MG PO TABS: 30.0000 mg | ORAL_TABLET | Freq: Two times a day (BID) | ORAL | Status: DC

## 2015-10-22 NOTE — Telephone Encounter (Signed)
Pt notified Rx's ready for pickup. Rx's printed and signed by Dr. Burnice Logan.

## 2015-10-22 NOTE — Telephone Encounter (Signed)
Pt need new Rx for Adderall °

## 2016-01-07 ENCOUNTER — Ambulatory Visit: Payer: Self-pay | Admitting: Family Medicine

## 2016-01-07 ENCOUNTER — Encounter: Payer: Self-pay | Admitting: Family Medicine

## 2016-01-07 ENCOUNTER — Ambulatory Visit (INDEPENDENT_AMBULATORY_CARE_PROVIDER_SITE_OTHER): Payer: 59 | Admitting: Family Medicine

## 2016-01-07 VITALS — BP 148/86 | HR 90 | Temp 98.0°F | Wt 168.1 lb

## 2016-01-07 DIAGNOSIS — L237 Allergic contact dermatitis due to plants, except food: Secondary | ICD-10-CM | POA: Diagnosis not present

## 2016-01-07 MED ORDER — PREDNISONE 10 MG PO TABS: ORAL_TABLET | ORAL | 0 refills | Status: DC

## 2016-01-07 NOTE — Progress Notes (Signed)
Subjective:    Patient ID: Norma Harmon, female    DOB: [birth year], 30 y.o.   MRN: [id number]  HPI  Norma Harmon is a 30 year old female who presents today with a "poison ivy" rash on arms bilaterally that presented one week ago after spraying weeds.  Associated symptom of pruritis is present also.  Denies fever, chills, sweats, SOB, edema, itchy/watery eyes. Treatment at home with calamine lotion has provided limited benefit.  She reports seeing poison ivy plants when working outside and denies any changes in her lotions, soaps, or detergents. Retake of heart rate is noted as 90. She reports use of adderall and states that she takes this BID.   Review of Systems  Constitutional: Negative for chills, fatigue and fever.  HENT: Negative for postnasal drip and sneezing.   Eyes: Negative for itching.  Respiratory: Negative for cough, shortness of breath and wheezing.   Cardiovascular: Negative for chest pain and palpitations.   Past Medical History:  Diagnosis Date  . ADHD (attention deficit hyperactivity disorder)    recently started on Adderol  . Allergic rhinitis   . Anxiety   . Arthritis    lumbar back  . Colitis   . Depression   . Diarrhea   . Family history of malignant neoplasm of gastrointestinal tract   . GERD (gastroesophageal reflux disease)   . IBS (irritable bowel syndrome)   . Migraine headache   . Ovarian cyst   . Pernicious anemia   . PONV (postoperative nausea and vomiting)   . Skin cancer    abdomen and neck- states not melanoma     Social History   Social History  . Marital status: Divorced    Spouse name: N/A  . Number of children: 0  . Years of education: N/A   Occupational History  . quality Development worker, community   Social History Main Topics  . Smoking status: Current Every Day Smoker    Packs/day: 0.25    Years: 2.00    Types: Cigarettes    Last attempt to quit: 04/28/2010  . Smokeless tobacco: Former Systems developer    Quit date: 01/29/2011  .  Alcohol use No     Comment: rarely  . Drug use: No  . Sexual activity: Yes    Birth control/ protection: Other-see comments     Comment: nuvaring   Other Topics Concern  . Not on file   Social History Narrative  . No narrative on file    Past Surgical History:  Procedure Laterality Date  . APPENDECTOMY  10/2008  . COLON RESECTION  04/02/2011   Procedure: COLON RESECTION LAPAROSCOPIC;  Surgeon: Pedro Earls, MD;  Location: [facility];  Service: General;  Laterality: N/A;  Laparscopic  Assisted Ileocecostomy   . FRACTURE SURGERY    . HAND SURGERY     left  . SMALL INTESTINE SURGERY    . SPINE SURGERY  08/2005   L5 - S1 fusion  . WISDOM TOOTH EXTRACTION      Family History  Problem Relation Age of Onset  . Colon cancer Paternal Grandfather   . Breast cancer Paternal Grandmother   . Colon polyps Paternal Grandmother   . Diabetes Maternal Grandmother   . Heart disease Maternal Grandmother   . Cirrhosis Maternal Grandfather     heavy drinker    Allergies  Allergen Reactions  . Percocet [Oxycodone-Acetaminophen] Swelling  . Soy Allergy     Abdominal pains    Current Outpatient Prescriptions  on File Prior to Visit  Medication Sig Dispense Refill  . amphetamine-dextroamphetamine (ADDERALL) 30 MG tablet Take 1 tablet by mouth 2 (two) times daily. 60 tablet 0  . amphetamine-dextroamphetamine (ADDERALL) 30 MG tablet Take 1 tablet by mouth 2 (two) times daily. 60 tablet 0  . amphetamine-dextroamphetamine (ADDERALL) 30 MG tablet Take 1 tablet by mouth 2 (two) times daily. 60 tablet 0  . cyanocobalamin (,VITAMIN B-12,) 1000 MCG/ML injection Inject 1 mL (1,000 mcg total) into the muscle once. 30 mL 3  . fluticasone (FLONASE) 50 MCG/ACT nasal spray Place 1 spray into both nostrils daily. 16 g 6  . hydrochlorothiazide (HYDRODIURIL) 25 MG tablet Take 1 tablet (25 mg total) by mouth daily as needed (swelling). 100 tablet 3  . norethindrone-ethinyl estradiol (JUNEL FE,GILDESS  FE,LOESTRIN FE) 1-20 MG-MCG tablet Take 1 tablet by mouth at bedtime.     . SYRINGE-NEEDLE, DISP, 3 ML 25G X 5/8" 3 ML MISC 1 each by Does not apply route every 30 (thirty) days. 100 each 0  . HYDROcodone-acetaminophen (NORCO/VICODIN) 5-325 MG per tablet Take 1 tablet by mouth every 6 (six) hours as needed for moderate pain. (Patient not taking: Reported on 01/07/2016) 30 tablet 0  . tizanidine (ZANAFLEX) 2 MG capsule Take 1-2 capsules (2-4 mg total) by mouth 3 (three) times daily. (Patient not taking: Reported on 01/07/2016) 30 capsule 0   No current facility-administered medications on file prior to visit.     BP (!) 148/86 (BP Location: Right Arm, Patient Position: Sitting, Cuff Size: Normal)   Pulse 90   Temp 98 F (36.7 C) (Oral)   Wt 168 lb 1.6 oz (76.2 kg)   LMP 01/07/2016 (Exact Date)   SpO2 97%   BMI 27.13 kg/m       Objective:   Physical Exam  Constitutional: She appears well-developed and well-nourished.  Eyes: Pupils are equal, round, and reactive to light. No scleral icterus.  Cardiovascular: Normal rate and regular rhythm.   Pulmonary/Chest: Effort normal and breath sounds normal. She has no wheezes.  Skin: Skin is warm and dry.  Papular lesions in a linear pattern on the medial aspect of arms bilaterally. No drainage or erythema present.  Area of papular lesions noted in a linear pattern on lateral aspect of left thigh without drainage or erythema present.        Assessment & Plan:  1. Poison ivy dermatitis Poison Ivy Dermatitis- Taper dose of oral prednisone provided with instructions regarding exposure avoidance and how to minimize reaction following exposure to plan. Pt was advised to call if erythema/swelling occurs, if symptoms worsen or if symptoms are not improved in 1 week. Add daily antihistamine such as zyrtec 10mg  once daily. OK to take 12.5mg  benedryl if needed at bedtime for itching.    - predniSONE (DELTASONE) 10 MG tablet; Take 4 tablets once daily for  3 days, 3 tablets daily for 3 days, 2 tablets daily for 3 days, and 1 tablet daily for 3 days.  Dispense: 30 tablet; Refill: 0  Delano Metz, FNP-C

## 2016-01-07 NOTE — Patient Instructions (Signed)
Please take medication as directed and follow up if symptoms do not improve in one week or worsen.   Poison [employer] ivy is a inflammation of the skin (contact dermatitis) caused by touching the allergens on the leaves of the ivy plant following previous exposure to the plant. The rash usually appears 48 hours after exposure. The rash is usually bumps (papules) or blisters (vesicles) in a linear pattern. Depending on your own sensitivity, the rash may simply cause redness and itching, or it may also progress to blisters which may break open. These must be well cared for to prevent secondary bacterial (germ) infection, followed by scarring. Keep any open areas dry, clean, dressed, and covered with an antibacterial ointment if needed. The eyes may also get puffy. The puffiness is worst in the morning and gets better as the day progresses. This dermatitis usually heals without scarring, within 2 to 3 weeks without treatment. HOME CARE INSTRUCTIONS  Thoroughly wash with soap and water as soon as you have been exposed to poison ivy. You have about one half hour to remove the plant resin before it will cause the rash. This washing will destroy the oil or antigen on the skin that is causing, or will cause, the rash. Be sure to wash under your fingernails as any plant resin there will continue to spread the rash. Do not rub skin vigorously when washing affected area. Poison ivy cannot spread if no oil from the plant remains on your body. A rash that has progressed to weeping sores will not spread the rash unless you have not washed thoroughly. It is also important to wash any clothes you have been wearing as these may carry active allergens. The rash will return if you wear the unwashed clothing, even several days later. Avoidance of the plant in the future is the best measure. Poison ivy plant can be recognized by the number of leaves. Generally, poison ivy has three leaves with flowering branches on a single  stem. Diphenhydramine may be purchased over the counter and used as needed for itching. Do not drive with this medication if it makes you drowsy.Ask your caregiver about medication for children. SEEK MEDICAL CARE IF:  Open sores develop.  Redness spreads beyond area of rash.  You notice purulent (pus-like) discharge.  You have increased pain.  Other signs of infection develop (such as fever).   This information is not intended to replace advice given to you by your health care provider. Make sure you discuss any questions you have with your health care provider.   Document Released: 04/11/2000 Document Revised: 07/07/2011 Document Reviewed: 09/20/2014 Elsevier Interactive Patient Education [employer].

## 2016-01-07 NOTE — Progress Notes (Signed)
Pre visit review using our clinic review tool, if applicable. No additional management support is needed unless otherwise documented below in the visit note. 

## 2016-01-08 ENCOUNTER — Ambulatory Visit: Payer: Self-pay | Admitting: Family Medicine

## 2016-01-17 ENCOUNTER — Telehealth: Payer: Self-pay | Admitting: Family Medicine

## 2016-01-17 NOTE — Telephone Encounter (Signed)
Pt needs new rx generic adderall 30 mg. Pt is aware md out of office until monday

## 2016-01-21 ENCOUNTER — Other Ambulatory Visit: Payer: Self-pay | Admitting: Emergency Medicine

## 2016-01-21 ENCOUNTER — Telehealth: Payer: Self-pay | Admitting: Family Medicine

## 2016-01-21 MED ORDER — AMPHETAMINE-DEXTROAMPHETAMINE 30 MG PO TABS: 30.0000 mg | ORAL_TABLET | Freq: Two times a day (BID) | ORAL | 0 refills | Status: DC

## 2016-01-21 NOTE — Telephone Encounter (Signed)
Pts mother called and requested in error one was previously requested on 9/21 by pt.

## 2016-01-21 NOTE — Telephone Encounter (Signed)
Called an spoke with pt informing that prescription has been printed and placed up front for pick up. Informed pt that per Dr. Sherren Mocha no further refills will be provided pt must establish care with a new PCP. Pt verbalized understanding.

## 2016-01-21 NOTE — Telephone Encounter (Signed)
Pt need new Rx Adderall

## 2016-03-05 ENCOUNTER — Other Ambulatory Visit: Payer: Self-pay | Admitting: Otolaryngology

## 2016-03-05 DIAGNOSIS — J329 Chronic sinusitis, unspecified: Secondary | ICD-10-CM

## 2016-03-13 ENCOUNTER — Ambulatory Visit
Admission: RE | Admit: 2016-03-13 | Discharge: 2016-03-13 | Disposition: A | Discharge status: Home / Self Care | Payer: 59 | Source: Ambulatory Visit | Attending: Otolaryngology | Admitting: Otolaryngology

## 2016-03-13 DIAGNOSIS — J329 Chronic sinusitis, unspecified: Secondary | ICD-10-CM

## 2016-03-24 ENCOUNTER — Encounter: Payer: Self-pay | Admitting: Family Medicine

## 2016-03-24 ENCOUNTER — Ambulatory Visit (INDEPENDENT_AMBULATORY_CARE_PROVIDER_SITE_OTHER): Payer: 59 | Admitting: Family Medicine

## 2016-03-24 VITALS — BP 168/102 | HR 123 | Temp 98.5°F | Ht 66.0 in | Wt 174.0 lb

## 2016-03-24 DIAGNOSIS — D510 Vitamin B12 deficiency anemia due to intrinsic factor deficiency: Secondary | ICD-10-CM

## 2016-03-24 DIAGNOSIS — R300 Dysuria: Secondary | ICD-10-CM | POA: Diagnosis not present

## 2016-03-24 MED ORDER — AMPHETAMINE-DEXTROAMPHETAMINE 30 MG PO TABS: 30.0000 mg | ORAL_TABLET | Freq: Two times a day (BID) | ORAL | 0 refills | Status: DC

## 2016-03-24 MED ORDER — HYDROCHLOROTHIAZIDE 25 MG PO TABS: 25.0000 mg | ORAL_TABLET | Freq: Every day | ORAL | 3 refills | Status: DC | PRN

## 2016-03-24 NOTE — Progress Notes (Signed)
Norma Harmon is a 30 year old female who comes in today for evaluation of multiple issues  She has a history of adult ADD and is on Adderall 30 mg twice a day.  She also has history of B12 deficiency. She's been offered B12 now for about 5 months. We'll check a B12 level today  She takes Hydrocort thiazide 25 mg daily for fluid retention. She also takes her BCPs  A month ago she took a short course of prednisone for allergy flareup. She's due to have sinus surgery December 11 by Dr. Lucia Gaskins. Today she's complaining of popping years and a cough for 2 weeks.  Vital signs stable she's afebrile except for BP 160/102 examination HEENT were negative neck was supple no adenopathy thyroid normal cardiopulmonary exam normal specifically no wheezing no crackles  #1 allergic rhinitis........ I would treat symptomatically with an antihistamine and steroid nasal spray....... I would not recommend another bout of prednisone since she just had this a month ago and she's having sinus surgery December 11. She may require allergy evaluation  #2 adult ADD.......Marland Kitchen refill Adderall  #3 elevated blood pressure....... BP check daily follow-up in 4-6 weeks if blood pressure remains elevated  Number for allergic rhinitis  #5 history of B12 deficiency....... check B12 level

## 2016-03-24 NOTE — Patient Instructions (Signed)
We will refill year Adderall for 3 months,,,,,,,,,,,, during the next 3 months she will need to find a new provider  For your head congestion drainage and cough I would recommend the following.........Marland Kitchen plain Zyrtec at bedtime...Marland KitchenMarland KitchenMarland Kitchen plain Claritin in the morning....... and steroid nasal spray......Marland Kitchen 1 shot up each nostril twice daily  B12 level today.......Marland Kitchen we will call you the report  Blood pressure check daily........... Omron pump up digital blood pressure cuff... [employer]  Blood pressure goal 130/80 or less....... if not at goal return in 6 weeks for follow-up

## 2016-03-25 LAB — VITAMIN B12: Vitamin B-12: 211 pg/mL (ref 211–911)

## 2016-03-27 ENCOUNTER — Other Ambulatory Visit: Payer: Self-pay | Admitting: Emergency Medicine

## 2016-03-27 MED ORDER — "SYRINGE/NEEDLE (DISP) 25G X 5/8"" 3 ML MISC": 1.0000 | 0 refills | Status: DC

## 2016-06-12 ENCOUNTER — Telehealth: Payer: Self-pay | Admitting: Family Medicine

## 2016-06-12 NOTE — Telephone Encounter (Signed)
Pt has scheduled with a new provider on 07/01/16, but she will be out of her amphetamine-dextroamphetamine (ADDERALL) 30 MG tablet    before then.  Would like to know if Dr Sherren Mocha will write a 30 day to get her through. Pt aware Dr Sherren Mocha out until Monday, pt has enough until then.

## 2016-06-16 ENCOUNTER — Telehealth: Payer: Self-pay | Admitting: Emergency Medicine

## 2016-06-16 MED ORDER — AMPHETAMINE-DEXTROAMPHETAMINE 30 MG PO TABS
30.0000 mg | ORAL_TABLET | Freq: Two times a day (BID) | ORAL | 0 refills | Status: DC
Start: 2016-06-16 — End: 2016-07-01

## 2016-06-16 NOTE — Telephone Encounter (Signed)
Spoke with pt to inform her that rx is ready for pickup

## 2016-07-01 ENCOUNTER — Encounter: Payer: Self-pay | Admitting: Family Medicine

## 2016-07-01 ENCOUNTER — Ambulatory Visit (INDEPENDENT_AMBULATORY_CARE_PROVIDER_SITE_OTHER): Payer: 59 | Admitting: Family Medicine

## 2016-07-01 VITALS — BP 170/110 | HR 94 | Temp 97.6°F | Resp 14 | Ht 66.0 in | Wt 173.0 lb

## 2016-07-01 DIAGNOSIS — M5135 Other intervertebral disc degeneration, thoracolumbar region: Secondary | ICD-10-CM | POA: Diagnosis not present

## 2016-07-01 DIAGNOSIS — Z8719 Personal history of other diseases of the digestive system: Secondary | ICD-10-CM | POA: Diagnosis not present

## 2016-07-01 DIAGNOSIS — R030 Elevated blood-pressure reading, without diagnosis of hypertension: Secondary | ICD-10-CM

## 2016-07-01 DIAGNOSIS — F901 Attention-deficit hyperactivity disorder, predominantly hyperactive type: Secondary | ICD-10-CM

## 2016-07-01 DIAGNOSIS — Z8742 Personal history of other diseases of the female genital tract: Secondary | ICD-10-CM

## 2016-07-01 DIAGNOSIS — D510 Vitamin B12 deficiency anemia due to intrinsic factor deficiency: Secondary | ICD-10-CM | POA: Diagnosis not present

## 2016-07-01 MED ORDER — "SYRINGE/NEEDLE (DISP) 25G X 5/8"" 3 ML MISC": 1.0000 | 11 refills | Status: DC

## 2016-07-01 NOTE — Progress Notes (Signed)
Subjective:    Patient ID: Norma Harmon, female    DOB: Apr 22, 1986, 31 y.o.   MRN: [id number]  HPI Patient is a very pleasant 31 year old white female here today to establish care. She is the daughter of Norma Harmon is one of my patients. Her father died suddenly when a building collapsed on him. However her father dealt with anxiety for years. Her brother has ADD as well as anxiety. The patient has a very competent past medical history. She suffered a crush injury to her left arm when it was stuck in a conveyor belt. Although there is no visible deformity to the arm now she still has residual pain in the left arm. She also has a history of L5-S1 lumbar fusion due to chronic low back pain and degenerative disc disease. She reports 2 bulging disc in her thoracic spine. She sees a neurosurgeon who refills her hydrocodone for her back pain. She uses this extremely rarely. A 30 day supply usually will last for 6 months. She also has a history of ADHD for which she takes Adderall grams twice a day. She was diagnosed with a condition as a child and has been on the medication for a long time. Her blood pressure today is extremely high. She states that she is extremely anxious although reviewing her past records also suggest that she has frequent visits with elevated blood pressure. She is not taking hydrochlorothiazide that her previous primary care provider had given her for swelling. She also has a history of a partial colectomy for colitis. Per the patient's report she believes that she was diagnosed with Crohn's disease although she is not following up regularly with the gastroenterologist. She sees a gynecologist who treats her for endometriosis with oral contraceptive pills Past Medical History:  Diagnosis Date  . ADHD (attention deficit hyperactivity disorder)    recently started on Adderol  . Allergic rhinitis   . Anxiety   . Arthritis    lumbar back  . Colitis   . Crushing injury of left hand   .  Depression   . Diarrhea   . Endometriosis    Dr. Rogue Bussing GYN  . Family history of malignant neoplasm of gastrointestinal tract   . GERD (gastroesophageal reflux disease)   . IBS (irritable bowel syndrome)   . Migraine headache   . Ovarian cyst   . Pernicious anemia   . PONV (postoperative nausea and vomiting)   . Rheumatoid arthritis ([place])    was seeing Ouida Sills but no longer  . Skin cancer    abdomen and neck- states not melanoma   Past Surgical History:  Procedure Laterality Date  . APPENDECTOMY  10/2008  . COLON RESECTION  04/02/2011   Procedure: COLON RESECTION LAPAROSCOPIC;  Surgeon: Pedro Earls, MD;  Location: [facility];  Service: General;  Laterality: N/A;  Laparscopic  Assisted Ileocecostomy   . FRACTURE SURGERY    . HAND SURGERY     left  . NASAL SINUS SURGERY    . SMALL INTESTINE SURGERY    . SPINE SURGERY  08/2005   L5 - S1 fusion  . WISDOM TOOTH EXTRACTION     Current Outpatient Prescriptions on File Prior to Visit  Medication Sig Dispense Refill  . amphetamine-dextroamphetamine (ADDERALL) 30 MG tablet Take 1 tablet by mouth 2 (two) times daily. 60 tablet 0  . hydrochlorothiazide (HYDRODIURIL) 25 MG tablet Take 1 tablet (25 mg total) by mouth daily as needed (swelling). 100 tablet 3  . HYDROcodone-acetaminophen (NORCO/VICODIN)  5-325 MG per tablet Take 1 tablet by mouth every 6 (six) hours as needed for moderate pain. 30 tablet 0  . norethindrone-ethinyl estradiol (JUNEL FE,GILDESS FE,LOESTRIN FE) 1-20 MG-MCG tablet Take 1 tablet by mouth at bedtime.     . cyanocobalamin (,VITAMIN B-12,) 1000 MCG/ML injection Inject 1 mL (1,000 mcg total) into the muscle once. (Patient not taking: Reported on 07/01/2016) 30 mL 3   No current facility-administered medications on file prior to visit.    Allergies  Allergen Reactions  . Percocet [Oxycodone-Acetaminophen] Swelling  . Soy Allergy     Abdominal pains   Social History   Social History  . Marital status: Divorced     Spouse name: N/A  . Number of children: 0  . Years of education: N/A   Occupational History  . quality Development worker, community   Social History Main Topics  . Smoking status: Current Every Day Smoker    Packs/day: 0.25    Years: 2.00    Types: Cigarettes  . Smokeless tobacco: Former Systems developer    Quit date: 01/29/2011  . Alcohol use No     Comment: rarely  . Drug use: No  . Sexual activity: Yes    Birth control/ protection: Other-see comments     Comment: nuvaring   Other Topics Concern  . Not on file   Social History Narrative  . No narrative on file   Family History  Problem Relation Age of Onset  . Colon cancer Paternal Grandfather   . Breast cancer Paternal Grandmother   . Colon polyps Paternal Grandmother   . Cancer Paternal Grandmother     breast  . Diabetes Maternal Grandmother   . Heart disease Maternal Grandmother   . Cirrhosis Maternal Grandfather     heavy drinker  . ADD / ADHD Brother       Review of Systems  All other systems reviewed and are negative.      Objective:   Physical Exam  Constitutional: She is oriented to person, place, and time. She appears well-developed and well-nourished. No distress.  HENT:  Head: Normocephalic and atraumatic.  Right Ear: External ear normal.  Left Ear: External ear normal.  Nose: Nose normal.  Mouth/Throat: Oropharynx is clear and moist. No oropharyngeal exudate.  Eyes: Conjunctivae and EOM are normal. Pupils are equal, round, and reactive to light.  Neck: Neck supple. No JVD present. No thyromegaly present.  Cardiovascular: Normal rate, regular rhythm and normal heart sounds.   No murmur heard. Pulmonary/Chest: Effort normal and breath sounds normal. No respiratory distress. She has no wheezes. She has no rales.  Abdominal: Soft. Bowel sounds are normal. She exhibits no distension. There is no tenderness. There is no rebound and no guarding.  Lymphadenopathy:    She has no cervical adenopathy.    Neurological: She is alert and oriented to person, place, and time. She has normal reflexes. She displays normal reflexes. No cranial nerve deficit. Coordination normal.  Skin: No rash noted. She is not diaphoretic. No erythema. No pallor.  Psychiatric: Her speech is normal and behavior is normal. Judgment and thought content normal. Cognition and memory are normal. She exhibits a depressed mood.  Vitals reviewed.  Tearful at times        Assessment & Plan:  Single episode of elevated blood pressure  Pernicious anemia  Attention-deficit hyperactivity disorder, predominantly hyperactive type  History of Crohn's disease  History of endometriosis  DDD (degenerative disc disease), thoracolumbar  Patient appears very sad and  depressed. I believe anxiety could be playing a large role in the patient's symptoms. However I'm extremely concerned about her blood pressure. I recommended that she check her blood pressure several times a day for the next 3 days and report the values to me on Friday. If her blood pressure is persistently elevated and I confirmed myself today at this visit, I would recommend talking with her gynecologist about switching to a non-estrogen-containing birth control, and I will check a urinalysis as well as a renal artery ultrasound and a renal ultrasound to evaluate for signs of fibromuscular dysplasia or renal artery stenosis. I will check basic lab work including a CBC, CMP, fasting lipid panel. And I will treat the patient's blood pressure most likely with an angiotensin receptor blocker. After we have determined the course of therapy for her blood pressure, I would then to with the patient trying to better manage her anxiety. However we will need to focus on her blood pressure first.

## 2016-07-08 ENCOUNTER — Telehealth: Payer: Self-pay | Admitting: Family Medicine

## 2016-07-08 NOTE — Telephone Encounter (Signed)
Pt called and LMOVM to inform you of her BP and she states that in the morning they are 130/80 Average. Later on in the day they have gone up to 145/90 but not any higher then that.

## 2016-07-08 NOTE — Telephone Encounter (Signed)
Still higher than I would like.  I would like to start her on lisinopril 10 poday and recheck bp here in 1 month.  At that time, we could also discuss options for anxiety if she would like.

## 2016-07-09 MED ORDER — LISINOPRIL 10 MG PO TABS: 10.0000 mg | ORAL_TABLET | Freq: Every day | ORAL | 3 refills | Status: DC

## 2016-07-09 NOTE — Telephone Encounter (Signed)
Patient aware of providers recommendations. Med sent to pharm and pt will call back to make appt.

## 2016-07-16 ENCOUNTER — Telehealth: Payer: Self-pay | Admitting: Family Medicine

## 2016-07-16 NOTE — Telephone Encounter (Signed)
Requesting a refill on her Adderall - Ok to refill??

## 2016-07-17 MED ORDER — AMPHETAMINE-DEXTROAMPHETAMINE 30 MG PO TABS: 30.0000 mg | ORAL_TABLET | Freq: Two times a day (BID) | ORAL | 0 refills | Status: DC

## 2016-07-17 NOTE — Telephone Encounter (Signed)
ok 

## 2016-07-17 NOTE — Telephone Encounter (Signed)
RX printed, left up front and patient aware to pick up after 2 pm via vm 

## 2016-08-12 ENCOUNTER — Other Ambulatory Visit: Payer: Self-pay | Admitting: Family Medicine

## 2016-08-14 MED ORDER — AMPHETAMINE-DEXTROAMPHETAMINE 30 MG PO TABS: 30.0000 mg | ORAL_TABLET | Freq: Two times a day (BID) | ORAL | 0 refills | Status: DC

## 2016-08-17 ENCOUNTER — Other Ambulatory Visit: Payer: Self-pay | Admitting: Family Medicine

## 2016-08-20 ENCOUNTER — Other Ambulatory Visit: Payer: Self-pay | Admitting: Family Medicine

## 2016-08-20 MED ORDER — CYANOCOBALAMIN 1000 MCG/ML IJ SOLN: INTRAMUSCULAR | 2 refills | Status: DC

## 2016-08-21 ENCOUNTER — Other Ambulatory Visit: Payer: Self-pay | Admitting: *Deleted

## 2016-08-21 MED ORDER — CYANOCOBALAMIN 1000 MCG/ML IJ SOLN: INTRAMUSCULAR | 2 refills | Status: DC

## 2016-09-16 ENCOUNTER — Telehealth: Payer: Self-pay | Admitting: Family Medicine

## 2016-09-16 MED ORDER — AMPHETAMINE-DEXTROAMPHETAMINE 30 MG PO TABS: 30.0000 mg | ORAL_TABLET | Freq: Two times a day (BID) | ORAL | 0 refills | Status: DC

## 2016-09-16 NOTE — Telephone Encounter (Signed)
ok 

## 2016-09-16 NOTE — Telephone Encounter (Signed)
Requesting a refill on Adderall - Ok to refill??

## 2016-09-16 NOTE — Telephone Encounter (Signed)
RX printed, left up front and patient aware to pick up via vm 

## 2016-10-15 ENCOUNTER — Other Ambulatory Visit: Payer: Self-pay | Admitting: Family Medicine

## 2016-10-15 ENCOUNTER — Encounter: Payer: Self-pay | Admitting: Family Medicine

## 2016-10-16 MED ORDER — AMPHETAMINE-DEXTROAMPHETAMINE 30 MG PO TABS
30.0000 mg | ORAL_TABLET | Freq: Two times a day (BID) | ORAL | 0 refills | Status: DC
Start: 2016-10-16 — End: 2016-11-18

## 2016-11-18 ENCOUNTER — Other Ambulatory Visit: Payer: Self-pay | Admitting: Family Medicine

## 2016-11-20 MED ORDER — AMPHETAMINE-DEXTROAMPHETAMINE 30 MG PO TABS: 30.0000 mg | ORAL_TABLET | Freq: Two times a day (BID) | ORAL | 0 refills | Status: DC

## 2016-11-20 NOTE — Telephone Encounter (Signed)
RX printed, left up front and patient aware to pick up via mychart 

## 2016-12-11 ENCOUNTER — Other Ambulatory Visit: Payer: Self-pay | Admitting: Family Medicine

## 2016-12-12 MED ORDER — AMPHETAMINE-DEXTROAMPHETAMINE 30 MG PO TABS
30.0000 mg | ORAL_TABLET | Freq: Two times a day (BID) | ORAL | 0 refills | Status: DC
Start: 2016-12-12 — End: 2017-01-16

## 2016-12-12 NOTE — Telephone Encounter (Signed)
RX printed, left up front and patient aware to pick up via mychart 

## 2016-12-17 ENCOUNTER — Encounter: Payer: Self-pay | Admitting: Family Medicine

## 2017-01-15 ENCOUNTER — Encounter: Payer: Self-pay | Admitting: Family Medicine

## 2017-01-16 ENCOUNTER — Other Ambulatory Visit: Payer: Self-pay | Admitting: Family Medicine

## 2017-01-16 MED ORDER — AMPHETAMINE-DEXTROAMPHETAMINE 30 MG PO TABS
30.0000 mg | ORAL_TABLET | Freq: Two times a day (BID) | ORAL | 0 refills | Status: DC
Start: 2017-01-16 — End: 2017-02-18

## 2017-02-18 ENCOUNTER — Other Ambulatory Visit: Payer: Self-pay | Admitting: Family Medicine

## 2017-02-19 MED ORDER — AMPHETAMINE-DEXTROAMPHETAMINE 30 MG PO TABS: 30.0000 mg | ORAL_TABLET | Freq: Two times a day (BID) | ORAL | 0 refills | Status: DC

## 2017-03-18 ENCOUNTER — Other Ambulatory Visit: Payer: Self-pay | Admitting: Family Medicine

## 2017-03-18 MED ORDER — AMPHETAMINE-DEXTROAMPHETAMINE 30 MG PO TABS
30.0000 mg | ORAL_TABLET | Freq: Two times a day (BID) | ORAL | 0 refills | Status: DC
Start: 2017-03-18 — End: 2017-04-18

## 2017-03-18 NOTE — Telephone Encounter (Signed)
Ok, due for f/u appt.

## 2017-03-18 NOTE — Telephone Encounter (Signed)
Ok to refill 

## 2017-04-15 ENCOUNTER — Other Ambulatory Visit: Payer: Self-pay | Admitting: Orthopaedic Surgery

## 2017-04-15 DIAGNOSIS — M545 Low back pain: Secondary | ICD-10-CM

## 2017-04-18 ENCOUNTER — Other Ambulatory Visit: Payer: Self-pay | Admitting: Family Medicine

## 2017-04-20 MED ORDER — AMPHETAMINE-DEXTROAMPHETAMINE 30 MG PO TABS: 30.0000 mg | ORAL_TABLET | Freq: Two times a day (BID) | ORAL | 0 refills | Status: DC

## 2017-04-20 NOTE — Telephone Encounter (Signed)
Ok to refill??  Last office visit 07/01/2016.  Last refill 03/18/2017.

## 2017-04-26 ENCOUNTER — Ambulatory Visit
Admission: RE | Admit: 2017-04-26 | Discharge: 2017-04-26 | Disposition: A | Discharge status: Home / Self Care | Payer: 59 | Source: Ambulatory Visit | Attending: Orthopaedic Surgery | Admitting: Orthopaedic Surgery

## 2017-04-26 DIAGNOSIS — M545 Low back pain: Secondary | ICD-10-CM

## 2017-05-05 ENCOUNTER — Other Ambulatory Visit: Payer: Self-pay | Admitting: Family Medicine

## 2017-05-05 DIAGNOSIS — R300 Dysuria: Secondary | ICD-10-CM

## 2017-05-06 NOTE — Telephone Encounter (Signed)
Ok to refill??  Last office visit 07/01/2016.  Last refill 04/20/2017.  Of note, patient was only given #39 tablets on 04/20/2017 fill~ pharmacy did not have quantity sufficient to fill entire prescription. Will be due for refill on 05/09/2017.

## 2017-05-07 MED ORDER — AMPHETAMINE-DEXTROAMPHETAMINE 30 MG PO TABS: 30.0000 mg | ORAL_TABLET | Freq: Two times a day (BID) | ORAL | 0 refills | Status: DC

## 2017-05-07 NOTE — Telephone Encounter (Signed)
NTBS, I will send refill but this is last without appt.

## 2017-05-15 DIAGNOSIS — M5106 Intervertebral disc disorders with myelopathy, lumbar region: Secondary | ICD-10-CM | POA: Diagnosis not present

## 2017-05-15 DIAGNOSIS — M791 Myalgia, unspecified site: Secondary | ICD-10-CM | POA: Diagnosis not present

## 2017-05-15 DIAGNOSIS — M4726 Other spondylosis with radiculopathy, lumbar region: Secondary | ICD-10-CM | POA: Diagnosis not present

## 2017-05-15 DIAGNOSIS — M5416 Radiculopathy, lumbar region: Secondary | ICD-10-CM | POA: Diagnosis not present

## 2017-06-05 ENCOUNTER — Other Ambulatory Visit: Payer: Self-pay | Admitting: Family Medicine

## 2017-06-08 MED ORDER — AMPHETAMINE-DEXTROAMPHETAMINE 30 MG PO TABS: 30.0000 mg | ORAL_TABLET | Freq: Two times a day (BID) | ORAL | 0 refills | Status: DC

## 2017-06-08 NOTE — Telephone Encounter (Signed)
Pt requesting refill on Adderall      LOV: 07/01/16  LRF:   05/07/17

## 2017-06-08 NOTE — Telephone Encounter (Signed)
Needs ov. Last rf.

## 2017-06-10 DIAGNOSIS — M545 Low back pain: Secondary | ICD-10-CM | POA: Diagnosis not present

## 2017-06-10 DIAGNOSIS — M47816 Spondylosis without myelopathy or radiculopathy, lumbar region: Secondary | ICD-10-CM | POA: Diagnosis not present

## 2017-06-10 DIAGNOSIS — M5416 Radiculopathy, lumbar region: Secondary | ICD-10-CM | POA: Diagnosis not present

## 2017-06-12 ENCOUNTER — Telehealth: Payer: Self-pay | Admitting: Family Medicine

## 2017-06-12 NOTE — Telephone Encounter (Signed)
[facility] called and stated that adderall 30mg  is in short supply and wanted to know if we could switch her to 15mg  2 tab po bid??? If ok just send to pharm.

## 2017-06-15 ENCOUNTER — Other Ambulatory Visit: Payer: Self-pay | Admitting: Family Medicine

## 2017-06-15 MED ORDER — AMPHETAMINE-DEXTROAMPHETAMINE 15 MG PO TABS: 30.0000 mg | ORAL_TABLET | Freq: Two times a day (BID) | ORAL | 0 refills | Status: DC

## 2017-06-15 NOTE — Telephone Encounter (Signed)
ntbs last refill

## 2017-06-22 ENCOUNTER — Encounter: Payer: Self-pay | Admitting: Family Medicine

## 2017-06-22 DIAGNOSIS — M5417 Radiculopathy, lumbosacral region: Secondary | ICD-10-CM | POA: Insufficient documentation

## 2017-06-23 DIAGNOSIS — M4726 Other spondylosis with radiculopathy, lumbar region: Secondary | ICD-10-CM | POA: Diagnosis not present

## 2017-06-23 DIAGNOSIS — G894 Chronic pain syndrome: Secondary | ICD-10-CM | POA: Diagnosis not present

## 2017-06-23 DIAGNOSIS — M542 Cervicalgia: Secondary | ICD-10-CM | POA: Diagnosis not present

## 2017-06-23 DIAGNOSIS — M545 Low back pain: Secondary | ICD-10-CM | POA: Diagnosis not present

## 2017-07-08 ENCOUNTER — Other Ambulatory Visit: Payer: Self-pay | Admitting: Family Medicine

## 2017-07-09 ENCOUNTER — Encounter: Payer: Self-pay | Admitting: Family Medicine

## 2017-07-09 ENCOUNTER — Ambulatory Visit: Payer: BLUE CROSS/BLUE SHIELD | Admitting: Family Medicine

## 2017-07-09 VITALS — BP 126/72 | HR 88 | Temp 98.1°F | Resp 16 | Ht 66.0 in | Wt 163.0 lb

## 2017-07-09 DIAGNOSIS — Z13220 Encounter for screening for lipoid disorders: Secondary | ICD-10-CM

## 2017-07-09 DIAGNOSIS — Z8719 Personal history of other diseases of the digestive system: Secondary | ICD-10-CM

## 2017-07-09 DIAGNOSIS — F901 Attention-deficit hyperactivity disorder, predominantly hyperactive type: Secondary | ICD-10-CM | POA: Diagnosis not present

## 2017-07-09 DIAGNOSIS — D510 Vitamin B12 deficiency anemia due to intrinsic factor deficiency: Secondary | ICD-10-CM | POA: Diagnosis not present

## 2017-07-09 DIAGNOSIS — R5382 Chronic fatigue, unspecified: Secondary | ICD-10-CM | POA: Diagnosis not present

## 2017-07-09 NOTE — Progress Notes (Signed)
Subjective:    Patient ID: Norma Harmon, female    DOB: July 02, 1985, 32 y.o.   MRN: [id number]  HPI  07/01/16 Patient is a very pleasant 32 year old white female here today to establish care. She is the daughter of Marlowe Kays who is one of my patients. Her father died suddenly when a building collapsed on him. However her father dealt with anxiety for years. Her brother has ADD as well as anxiety. The patient has a very complicated past medical history. She suffered a crush injury to her left arm when it was stuck in a conveyor belt. Although there is no visible deformity to the arm now she still has residual pain in the left arm. She also has a history of L5-S1 lumbar fusion due to chronic low back pain and degenerative disc disease. She reports 2 bulging disc in her thoracic spine. She sees a neurosurgeon who refills her hydrocodone for her back pain. She uses this extremely rarely. A 30 day supply usually will last for 6 months. She also has a history of ADHD for which she takes Adderall  twice a day. She was diagnosed with a condition as a child and has been on the medication for a long time. Her blood pressure today is extremely high. She states that she is extremely anxious although reviewing her past records also suggest that she has frequent visits with elevated blood pressure. She is not taking hydrochlorothiazide that her previous primary care provider had given her for swelling. She also has a history of a partial colectomy for colitis. Per the patient's report she believes that she was diagnosed with Crohn's disease although she is not following up regularly with the gastroenterologist. She sees a gynecologist who treats her for endometriosis with oral contraceptive pills.  At that time, ,my plan was: Patient appears very sad and depressed. I believe anxiety could be playing a large role in the patient's symptoms. However I'm extremely concerned about her blood pressure. I recommended that she  check her blood pressure several times a day for the next 3 days and report the values to me on Friday. If her blood pressure is persistently elevated and I confirmed myself today at this visit, I would recommend talking with her gynecologist about switching to a non-estrogen-containing birth control, and I will check a urinalysis as well as a renal artery ultrasound and a renal ultrasound to evaluate for signs of fibromuscular dysplasia or renal artery stenosis. I will check basic lab work including a CBC, CMP, fasting lipid panel. And I will treat the patient's blood pressure most likely with an angiotensin receptor blocker. After we have determined the course of therapy for her blood pressure, I would then to with the patient trying to better manage her anxiety. However we will need to focus on her blood pressure first.  07/09/17 Since I last saw the patient, she quit taking her oral contraceptive pills due to her elevated blood pressure.  Shortly thereafter, her blood pressure improved dramatically.  She has been checking her blood pressure regularly on her own and finding it running between 120 and 130/70-80.  She is compliant with lisinopril which is also helping her blood pressure.  However she is now no longer on contraception.  Unfortunately she has very heavy menstrual periods.  She has discussed her case with her gynecologist who has recommended an IUD but she is yet to pursue that.  She states that she can soak a tampon quickly during her period and  that can last for more than 7 days.  She also reports severe fatigue.  She is on vitamin B12 injections monthly due to a history of pernicious anemia after she had surgical resection of part of her intestines due to Crohn's disease.  She is overdue to check a vitamin B12 level.  She is still taking Adderall immediate release 30 mg twice a day.  She quit her previous job and is currently in a new job where she is much happier.  Her anxiety level has  improved.  She still feels like she needs the current dose of medication to help her focus however she would be willing to consider weaning down on the medication in the future Past Medical History:  Diagnosis Date  . ADHD (attention deficit hyperactivity disorder)    recently started on Adderol  . Allergic rhinitis   . Anxiety   . Arthritis    lumbar back  . Colitis   . Crushing injury of left hand   . Depression   . Diarrhea   . Endometriosis    Dr. Rogue Bussing GYN  . Family history of malignant neoplasm of gastrointestinal tract   . GERD (gastroesophageal reflux disease)   . IBS (irritable bowel syndrome)   . Lumbosacral radiculopathy at L4    left side, (Dr. Tow[facility]alk[facility] group).   . Migraine headache   . Ovarian cyst   . Pernicious anemia   . PONV (postoperative nausea and vomiting)   . Rheumatoid arthritis ([place])    was seeing Ouida Sills but no longer  . Skin cancer    abdomen and neck- states not melanoma   Past Surgical History:  Procedure Laterality Date  . APPENDECTOMY  10/2008  . COLON RESECTION  04/02/2011   Procedure: COLON RESECTION LAPAROSCOPIC;  Surgeon: Pedro Earls, MD;  Location: [facility];  Service: General;  Laterality: N/A;  Laparscopic  Assisted Ileocecostomy   . FRACTURE SURGERY    . HAND SURGERY     left  . NASAL SINUS SURGERY    . SMALL INTESTINE SURGERY    . SPINE SURGERY  08/2005   L5 - S1 fusion  . WISDOM TOOTH EXTRACTION     Current Outpatient Medications on File Prior to Visit  Medication Sig Dispense Refill  . amphetamine-dextroamphetamine (ADDERALL) 30 MG tablet Take 1 tablet by mouth 2 (two) times daily. 60 tablet 0  . cyanocobalamin (,VITAMIN B-12,) 1000 MCG/ML injection INJECT 1ML INTO THE MUSCLE EVERY 30 DAYS 10 mL 2  . hydrochlorothiazide (HYDRODIURIL) 25 MG tablet TAKE 1 TABLET BY MOUTH EVERY DAY AS NEEDED FOR SWELLING 100 tablet 1  . HYDROcodone-acetaminophen (NORCO/VICODIN) 5-325 MG per tablet Take 1 tablet by mouth every 6 (six) hours as  needed for moderate pain. 30 tablet 0  . lisinopril (PRINIVIL,ZESTRIL) 10 MG tablet TAKE 1 TABLET (10 MG TOTAL) BY MOUTH DAILY. 90 tablet 3  . SYRINGE-NEEDLE, DISP, 3 ML 25G X 5/8" 3 ML MISC 1 each by Does not apply route every 30 (thirty) days. 100 each 11   No current facility-administered medications on file prior to visit.    Allergies  Allergen Reactions  . Percocet [Oxycodone-Acetaminophen] Swelling  . Soy Allergy     Abdominal pains   Social History   Socioeconomic History  . Marital status: Divorced    Spouse name: Not on file  . Number of children: 0  . Years of education: Not on file  . Highest education level: Not on file  Social Needs  .  Financial resource strain: Not on file  . Food insecurity - worry: Not on file  . Food insecurity - inability: Not on file  . Transportation needs - medical: Not on file  . Transportation needs - non-medical: Not on file  Occupational History  . Occupation: Nurse, adult: [employer]  Tobacco Use  . Smoking status: Current Every Day Smoker    Packs/day: 0.25    Years: 2.00    Pack years: 0.50    Types: Cigarettes  . Smokeless tobacco: Former Systems developer    Quit date: 01/29/2011  Substance and Sexual Activity  . Alcohol use: No    Comment: rarely  . Drug use: No  . Sexual activity: Yes    Birth control/protection: Other-see comments    Comment: nuvaring  Other Topics Concern  . Not on file  Social History Narrative  . Not on file   Family History  Problem Relation Age of Onset  . Colon cancer Paternal Grandfather   . Breast cancer Paternal Grandmother   . Colon polyps Paternal Grandmother   . Cancer Paternal Grandmother        breast  . Diabetes Maternal Grandmother   . Heart disease Maternal Grandmother   . Cirrhosis Maternal Grandfather        heavy drinker  . ADD / ADHD Brother       Review of Systems  All other systems reviewed and are negative.      Objective:   Physical Exam    Constitutional: She is oriented to person, place, and time. She appears well-developed and well-nourished. No distress.  HENT:  Head: Normocephalic and atraumatic.  Right Ear: External ear normal.  Left Ear: External ear normal.  Nose: Nose normal.  Mouth/Throat: Oropharynx is clear and moist. No oropharyngeal exudate.  Eyes: Conjunctivae and EOM are normal. Pupils are equal, round, and reactive to light.  Neck: Neck supple. No JVD present. No thyromegaly present.  Cardiovascular: Normal rate, regular rhythm and normal heart sounds.  No murmur heard. Pulmonary/Chest: Effort normal and breath sounds normal. No respiratory distress. She has no wheezes. She has no rales.  Abdominal: Soft. Bowel sounds are normal. She exhibits no distension. There is no tenderness. There is no rebound and no guarding.  Lymphadenopathy:    She has no cervical adenopathy.  Neurological: She is alert and oriented to person, place, and time. She has normal reflexes. No cranial nerve deficit. Coordination normal.  Skin: No rash noted. She is not diaphoretic. No erythema. No pallor.  Psychiatric: Her speech is normal and behavior is normal. Judgment and thought content normal. Her mood appears not anxious. Cognition and memory are normal. She does not exhibit a depressed mood.  Vitals reviewed.          Assessment & Plan:  Chronic fatigue - Plan: CBC with Differential/Platelet, COMPLETE METABOLIC PANEL WITH GFR, Vitamin B12, TSH  Screening cholesterol level - Plan: LDL Cholesterol, Direct  Pernicious anemia  Attention-deficit hyperactivity disorder, predominantly hyperactive type  History of Crohn's disease - Plan: Ambulatory referral to Gastroenterology  I am concerned about the patient's chronic fatigue.  I suspect is due to nutritional deficiencies given her history of Crohn's disease.  I recommended checking a vitamin B12 level, a CBC to evaluate for anemia, a TSH, and a CMP.  If the patient is  anemic, I would recommend checking an iron level.  I encouraged the patient to pursue an IUD through her gynecologist to help control her  vaginal bleeding.  While checking lab work, I will also check the patient's cholesterol.  Her blood pressure is much better on the medication and off her birth control.  I will make no changes in her blood pressure medication.  Continue Adderall 30 mg twice daily however I would like to eventually try to wean the patient down in the future once she has become established in her new job.

## 2017-07-10 ENCOUNTER — Encounter: Payer: Self-pay | Admitting: Family Medicine

## 2017-07-10 LAB — COMPLETE METABOLIC PANEL WITH GFR
AG Ratio: 1.8 (calc) (ref 1.0–2.5)
ALBUMIN MSPROF: 4.4 g/dL (ref 3.6–5.1)
ALT: 20 U/L (ref 6–29)
AST: 18 U/L (ref 10–30)
Alkaline phosphatase (APISO): 45 U/L (ref 33–115)
BILIRUBIN TOTAL: 0.3 mg/dL (ref 0.2–1.2)
BUN: 16 mg/dL (ref 7–25)
CHLORIDE: 102 mmol/L (ref 98–110)
CO2: 25 mmol/L (ref 20–32)
CREATININE: 1.08 mg/dL (ref 0.50–1.10)
Calcium: 9.6 mg/dL (ref 8.6–10.2)
GFR, EST AFRICAN AMERICAN: 80 mL/min/{1.73_m2} (ref >=60)
GFR, Est Non African American: 69 mL/min/{1.73_m2} (ref >=60)
GLUCOSE: 89 mg/dL (ref 65–99)
Globulin: 2.4 g/dL (calc) (ref 1.9–3.7)
Potassium: 3.8 mmol/L (ref 3.5–5.3)
Sodium: 140 mmol/L (ref 135–146)
TOTAL PROTEIN: 6.8 g/dL (ref 6.1–8.1)

## 2017-07-10 LAB — CBC WITH DIFFERENTIAL/PLATELET
BASOS PCT: 0.9 %
Basophils Absolute: 58 cells/uL (ref 0–200)
EOS ABS: 109 {cells}/uL (ref 15–500)
EOS PCT: 1.7 %
HCT: 37.4 % (ref 35.0–45.0)
HEMOGLOBIN: 13 g/dL (ref 11.7–15.5)
Lymphs Abs: 1875 cells/uL (ref 850–3900)
MCH: 30.4 pg (ref 27.0–33.0)
MCHC: 34.8 g/dL (ref 32.0–36.0)
MCV: 87.6 fL (ref 80.0–100.0)
MONOS PCT: 8.7 %
MPV: 9.5 fL (ref 7.5–12.5)
Neutro Abs: 3802 cells/uL (ref 1500–7800)
Neutrophils Relative %: 59.4 %
PLATELETS: 297 10*3/uL (ref 140–400)
RBC: 4.27 10*6/uL (ref 3.80–5.10)
RDW: 12.6 % (ref 11.0–15.0)
TOTAL LYMPHOCYTE: 29.3 %
WBC mixed population: 557 cells/uL (ref 200–950)
WBC: 6.4 10*3/uL (ref 3.8–10.8)

## 2017-07-10 LAB — LDL CHOLESTEROL, DIRECT: Direct LDL: 111 mg/dL — ABNORMAL HIGH (ref <100)

## 2017-07-10 LAB — VITAMIN B12: Vitamin B-12: 1014 pg/mL (ref 200–1100)

## 2017-07-10 LAB — TSH: TSH: 0.93 m[IU]/L

## 2017-07-17 ENCOUNTER — Other Ambulatory Visit: Payer: Self-pay | Admitting: Family Medicine

## 2017-07-17 ENCOUNTER — Telehealth: Payer: Self-pay | Admitting: Family Medicine

## 2017-07-17 MED ORDER — OSELTAMIVIR PHOSPHATE 75 MG PO CAPS: 75.0000 mg | ORAL_CAPSULE | Freq: Two times a day (BID) | ORAL | 0 refills | Status: DC

## 2017-07-17 NOTE — Telephone Encounter (Signed)
Patient is calling to say that when she was in to see dr pickard on the 14th, she had told him the flu was going around at her work, so now she is having chills, fever and would like to know if tamiflu can be called in for her  [place]

## 2017-07-17 NOTE — Telephone Encounter (Signed)
Pt aware.

## 2017-07-17 NOTE — Telephone Encounter (Signed)
Was sent to her pharmacy.

## 2017-07-19 ENCOUNTER — Other Ambulatory Visit: Payer: Self-pay | Admitting: Family Medicine

## 2017-07-20 MED ORDER — AMPHETAMINE-DEXTROAMPHETAMINE 30 MG PO TABS: 30.0000 mg | ORAL_TABLET | Freq: Two times a day (BID) | ORAL | 0 refills | Status: DC

## 2017-07-20 NOTE — Telephone Encounter (Signed)
Ok to refill??  Last office visit 07/09/2017.  Last refill 06/08/2017.

## 2017-07-23 ENCOUNTER — Telehealth: Payer: Self-pay | Admitting: Family Medicine

## 2017-07-23 MED ORDER — AMPHETAMINE-DEXTROAMPHETAMINE 30 MG PO TABS: 30.0000 mg | ORAL_TABLET | Freq: Two times a day (BID) | ORAL | 0 refills | Status: DC

## 2017-07-23 NOTE — Telephone Encounter (Signed)
Sent!

## 2017-07-23 NOTE — Telephone Encounter (Signed)
Can you send this to a different pharmacy - [facility] is having a manufacturer back order shortage.

## 2017-08-10 DIAGNOSIS — M791 Myalgia, unspecified site: Secondary | ICD-10-CM | POA: Diagnosis not present

## 2017-08-10 DIAGNOSIS — M4726 Other spondylosis with radiculopathy, lumbar region: Secondary | ICD-10-CM | POA: Diagnosis not present

## 2017-08-10 DIAGNOSIS — M542 Cervicalgia: Secondary | ICD-10-CM | POA: Diagnosis not present

## 2017-08-19 ENCOUNTER — Encounter: Payer: Self-pay | Admitting: Family Medicine

## 2017-08-19 ENCOUNTER — Other Ambulatory Visit: Payer: Self-pay | Admitting: Physician Assistant

## 2017-08-19 DIAGNOSIS — M5416 Radiculopathy, lumbar region: Secondary | ICD-10-CM | POA: Diagnosis not present

## 2017-08-20 ENCOUNTER — Other Ambulatory Visit: Payer: Self-pay | Admitting: Family Medicine

## 2017-08-20 MED ORDER — AMPHETAMINE-DEXTROAMPHETAMINE 30 MG PO TABS: 30.0000 mg | ORAL_TABLET | Freq: Two times a day (BID) | ORAL | 0 refills | Status: DC

## 2017-08-20 NOTE — Telephone Encounter (Signed)
Last OV 07/09/2017 Last refill  07/23/2017 Ok to refill?

## 2017-08-20 NOTE — Telephone Encounter (Signed)
Ok to refill??  Last office visit 07/09/2017.  Last refill 07/23/2017.

## 2017-08-29 ENCOUNTER — Other Ambulatory Visit: Payer: Self-pay

## 2017-08-29 ENCOUNTER — Encounter (HOSPITAL_COMMUNITY): Payer: Self-pay | Admitting: Emergency Medicine

## 2017-08-29 ENCOUNTER — Emergency Department (HOSPITAL_COMMUNITY)
Admission: EM | Admit: 2017-08-29 | Discharge: 2017-08-29 | Disposition: A | Discharge status: Home / Self Care | Payer: BLUE CROSS/BLUE SHIELD | Attending: Emergency Medicine | Admitting: Emergency Medicine

## 2017-08-29 ENCOUNTER — Emergency Department (HOSPITAL_COMMUNITY): Payer: BLUE CROSS/BLUE SHIELD

## 2017-08-29 DIAGNOSIS — Z85828 Personal history of other malignant neoplasm of skin: Secondary | ICD-10-CM | POA: Insufficient documentation

## 2017-08-29 DIAGNOSIS — M542 Cervicalgia: Secondary | ICD-10-CM | POA: Diagnosis not present

## 2017-08-29 DIAGNOSIS — Z79899 Other long term (current) drug therapy: Secondary | ICD-10-CM | POA: Diagnosis not present

## 2017-08-29 DIAGNOSIS — F17210 Nicotine dependence, cigarettes, uncomplicated: Secondary | ICD-10-CM | POA: Diagnosis not present

## 2017-08-29 DIAGNOSIS — M5412 Radiculopathy, cervical region: Secondary | ICD-10-CM

## 2017-08-29 MED ORDER — HYDROXYZINE HCL 25 MG PO TABS: 25.0000 mg | ORAL_TABLET | Freq: Three times a day (TID) | ORAL | 0 refills | Status: DC | PRN

## 2017-08-29 MED ORDER — METHOCARBAMOL 500 MG PO TABS: 500.0000 mg | ORAL_TABLET | Freq: Two times a day (BID) | ORAL | 0 refills | Status: DC

## 2017-08-29 MED ORDER — GABAPENTIN 100 MG PO CAPS: 100.0000 mg | ORAL_CAPSULE | Freq: Three times a day (TID) | ORAL | 0 refills | Status: DC

## 2017-08-29 MED ORDER — MORPHINE SULFATE (PF) 4 MG/ML IV SOLN
6.0000 mg | Freq: Once | INTRAVENOUS | Status: AC
  Administered 2017-08-29: 6 mg via INTRAMUSCULAR
  Filled 2017-08-29: qty 2

## 2017-08-29 NOTE — ED Provider Notes (Signed)
[facility] EMERGENCY DEPARTMENT Provider Note   CSN: [id number] Arrival date & time: 08/29/17  1121     History   Chief Complaint Chief Complaint  Patient presents with  . Neck Pain    HPI Norma Harmon is a 32 y.o. female.  HPI   32 year old female with history of Rheumatoid Arthritis, recurrent headache, depression, ADHD presenting complaining of neck pain.  Patient here with primary complaints of radicular neck pain.  Started approximately a week and half ago but has worsened within the past 3 days.  Pain is described as a tightness sharp shooting pain from neck towards her left shoulder and increasing pain with lateral movement of her neck.  She have got to the point where she is afraid to move in any direction due to the pain.  She is having difficulty driving because she is having trouble turning her neck.  She has had this problem in the past but never this severe.  She denies any specific injury.  No report of fever, lightheadedness, dizziness, chest pain, focal numbness or weakness.  She has tried numerous medications at home including Vicodin, steroid Dosepak, ibuprofen, and Flexeril without relief.  She does follow-up with a spine specialist, Dr. Patrice Paradise but states she does not feel that her symptom can be delay until Monday.  Past Medical History:  Diagnosis Date  . ADHD (attention deficit hyperactivity disorder)    recently started on Adderol  . Allergic rhinitis   . Anxiety   . Arthritis    lumbar back  . Colitis   . Crushing injury of left hand   . Depression   . Diarrhea   . Endometriosis    Dr. Rogue Bussing GYN  . Family history of malignant neoplasm of gastrointestinal tract   . GERD (gastroesophageal reflux disease)   . IBS (irritable bowel syndrome)   . Lumbosacral radiculopathy at L4    left side, (Dr. Towanda Malkin group).   . Migraine headache   . Ovarian cyst   . Pernicious anemia   . PONV (postoperative nausea and vomiting)   . Rheumatoid  arthritis ([place])    was seeing Ouida Sills but no longer  . Skin cancer    abdomen and neck- states not melanoma    Patient Active Problem List   Diagnosis Date Noted  . Lumbosacral radiculopathy at L4   . Diarrhea 05/17/2014  . H/O abdominal surgery 05/17/2014  . Esophageal reflux 05/17/2014  . Allergic rhinitis 04/11/2014  . Depression 03/13/2014  . Migraine headache 07/12/2013  . Costochondritis 06/01/2013  . Joint pain 02/17/2013  . Pernicious anemia 02/17/2013  . Thoracic back pain 08/23/2012  . Left shoulder pain 08/23/2012  . 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 Abdominal pain, other specified site 10/24/2011  . Dysplastic nevi 08/05/2011  . Abdominal pain 06/17/2011  . ADHD (attention deficit hyperactivity disorder) 02/11/2011    Past Surgical History:  Procedure Laterality Date  . APPENDECTOMY  10/2008  . COLON RESECTION  04/02/2011   Procedure: COLON RESECTION LAPAROSCOPIC;  Surgeon: Pedro Earls, MD;  Location: [facility];  Service: General;  Laterality: N/A;  Laparscopic  Assisted Ileocecostomy   . FRACTURE SURGERY    . HAND SURGERY     left  . NASAL SINUS SURGERY    . SMALL INTESTINE SURGERY    . SPINE SURGERY  08/2005   L5 - S1 fusion  . WISDOM TOOTH EXTRACTION       OB History   None  Home Medications    Prior to Admission medications   Medication Sig Start Date End Date Taking? Authorizing Provider  amphetamine-dextroamphetamine (ADDERALL) 30 MG tablet Take 1 tablet by mouth 2 (two) times daily. 08/20/17   Susy Frizzle, MD  cyanocobalamin (,VITAMIN B-12,) 1000 MCG/ML injection INJECT 1ML INTO THE MUSCLE EVERY 30 DAYS 08/21/16   Alycia Rossetti, MD  hydrochlorothiazide (HYDRODIURIL) 25 MG tablet TAKE 1 TABLET BY MOUTH EVERY DAY AS NEEDED FOR SWELLING 05/05/17   Dorena Cookey, MD  HYDROcodone-acetaminophen (NORCO/VICODIN) 5-325 MG per tablet Take 1 tablet by mouth every 6 (six) hours as needed for moderate pain. 08/04/13   Dorena Cookey, MD  lisinopril  (PRINIVIL,ZESTRIL) 10 MG tablet TAKE 1 TABLET (10 MG TOTAL) BY MOUTH DAILY. 07/08/17   Susy Frizzle, MD  oseltamivir (TAMIFLU) 75 MG capsule Take 1 capsule (75 mg total) by mouth 2 (two) times daily. 07/17/17   Susy Frizzle, MD  SYRINGE-NEEDLE, DISP, 3 ML 25G X 5/8" 3 ML MISC 1 each by Does not apply route every 30 (thirty) days. 07/01/16   Susy Frizzle, MD    Family History Family History  Problem Relation Age of Onset  . Colon cancer Paternal Grandfather   . Breast cancer Paternal Grandmother   . Colon polyps Paternal Grandmother   . Cancer Paternal Grandmother        breast  . Diabetes Maternal Grandmother   . Heart disease Maternal Grandmother   . Cirrhosis Maternal Grandfather        heavy drinker  . ADD / ADHD Brother     Social History Social History   Tobacco Use  . Smoking status: Current Every Day Smoker    Packs/day: 0.25    Years: 2.00    Pack years: 0.50    Types: Cigarettes  . Smokeless tobacco: Former Systems developer    Quit date: 01/29/2011  Substance Use Topics  . Alcohol use: No    Comment: rarely  . Drug use: No     Allergies   Percocet [oxycodone-acetaminophen] and Soy allergy   Review of Systems Review of Systems  All other systems reviewed and are negative.    Physical Exam Updated Vital Signs BP 127/86 (BP Location: Right Arm)   Pulse (!) 104   Temp 98.1 F (36.7 C) (Oral)   Resp 14   Ht 5' 5.5" (1.664 m)   Wt 72.6 kg (160 lb)   LMP 08/29/2017   SpO2 100%   BMI 26.22 kg/m   Physical Exam  Constitutional: She is oriented to person, place, and time. She appears well-developed and well-nourished. No distress.  HENT:  Head: Atraumatic.  Eyes: Conjunctivae are normal.  Neck: Neck supple.  Tenderness along cervical and paracervical spinal muscle on palpation without focal point tenderness.  No crepitus or step-off.  No overlying skin changes.  Decrease neck flexion extension or lateral rotation secondary to pain.  Cardiovascular:  Normal rate and regular rhythm.  Pulmonary/Chest: Effort normal and breath sounds normal.  Neurological: She is alert and oriented to person, place, and time.  Skin: No rash noted.  Psychiatric: She has a normal mood and affect.  Nursing note and vitals reviewed.    ED Treatments / Results  Labs (all labs ordered are listed, but only abnormal results are displayed) Labs Reviewed - No data to display  EKG None  Radiology No results found.  Procedures Procedures (including critical care time)  Medications Ordered in ED Medications - No data  to display   Initial Impression / Assessment and Plan / ED Course  I have reviewed the triage vital signs and the nursing notes.  Pertinent labs & imaging results that were available during my care of the patient were reviewed by me and considered in my medical decision making (see chart for details).      BP 127/86 (BP Location: Right Arm)   Pulse (!) 104   Temp 98.1 F (36.7 C) (Oral)   Resp 14   Ht 5' 5.5" (1.664 m)   Wt 72.6 kg (160 lb)   LMP 08/29/2017   SpO2 100%   BMI 26.22 kg/m    Final Clinical Impressions(s) / ED Diagnoses   Final diagnoses:  Cervical radiculopathy    ED Discharge Orders        Ordered    gabapentin (NEURONTIN) 100 MG capsule  3 times daily     08/29/17 1429    methocarbamol (ROBAXIN) 500 MG tablet  2 times daily     08/29/17 1429     2:28 PM Patient with recurrent history of cervical and thoracic and lumbar spine pathology, history of bulging disc with worsening cervical radiculopathy.  No focal weakness.  Pain is reproducible on exam likely muscle skeletal.  X-ray of her cervical spine unremarkable.  Will provide a soft collar, prescribe Neurontin, and Robaxin.  Recommend close follow-up with orthopedics for further care.   Domenic Moras, PA-C 08/29/17 1431    Lajean Saver, MD 08/29/17 1524

## 2017-08-29 NOTE — ED Triage Notes (Signed)
Pt. Stated, I have a bulging disc at my neck and its flared up again about a week ago and its gotten worse.

## 2017-08-29 NOTE — ED Notes (Signed)
Patient transported to X-ray 

## 2017-08-31 DIAGNOSIS — Z79899 Other long term (current) drug therapy: Secondary | ICD-10-CM | POA: Diagnosis not present

## 2017-08-31 DIAGNOSIS — M5106 Intervertebral disc disorders with myelopathy, lumbar region: Secondary | ICD-10-CM | POA: Diagnosis not present

## 2017-08-31 DIAGNOSIS — M545 Low back pain: Secondary | ICD-10-CM | POA: Diagnosis not present

## 2017-08-31 DIAGNOSIS — G894 Chronic pain syndrome: Secondary | ICD-10-CM | POA: Diagnosis not present

## 2017-08-31 DIAGNOSIS — M542 Cervicalgia: Secondary | ICD-10-CM | POA: Diagnosis not present

## 2017-08-31 DIAGNOSIS — M5416 Radiculopathy, lumbar region: Secondary | ICD-10-CM | POA: Diagnosis not present

## 2017-09-13 ENCOUNTER — Other Ambulatory Visit: Payer: Self-pay | Admitting: Family Medicine

## 2017-09-14 MED ORDER — AMPHETAMINE-DEXTROAMPHETAMINE 30 MG PO TABS: 30.0000 mg | ORAL_TABLET | Freq: Two times a day (BID) | ORAL | 0 refills | Status: DC

## 2017-09-14 NOTE — Telephone Encounter (Signed)
Ok to refill??  Last office visit 07/09/2017.  Last refill 07/31/2017.

## 2017-09-17 ENCOUNTER — Encounter: Payer: Self-pay | Admitting: Family Medicine

## 2017-09-17 ENCOUNTER — Other Ambulatory Visit: Payer: Self-pay | Admitting: Family Medicine

## 2017-09-17 MED ORDER — AMPHETAMINE-DEXTROAMPHETAMINE 30 MG PO TABS: 30.0000 mg | ORAL_TABLET | Freq: Two times a day (BID) | ORAL | 0 refills | Status: DC

## 2017-10-13 ENCOUNTER — Other Ambulatory Visit: Payer: Self-pay | Admitting: Family Medicine

## 2017-10-13 MED ORDER — AMPHETAMINE-DEXTROAMPHETAMINE 30 MG PO TABS: 30.0000 mg | ORAL_TABLET | Freq: Two times a day (BID) | ORAL | 0 refills | Status: DC

## 2017-10-13 NOTE — Telephone Encounter (Signed)
Ok to refill??  Last office visit 07/09/2017.  Last refill 09/17/2017.

## 2017-10-19 DIAGNOSIS — M545 Low back pain: Secondary | ICD-10-CM | POA: Diagnosis not present

## 2017-10-19 DIAGNOSIS — M542 Cervicalgia: Secondary | ICD-10-CM | POA: Diagnosis not present

## 2017-10-19 DIAGNOSIS — G894 Chronic pain syndrome: Secondary | ICD-10-CM | POA: Diagnosis not present

## 2017-10-19 DIAGNOSIS — M5416 Radiculopathy, lumbar region: Secondary | ICD-10-CM | POA: Diagnosis not present

## 2017-11-13 ENCOUNTER — Other Ambulatory Visit: Payer: Self-pay | Admitting: Family Medicine

## 2017-11-13 MED ORDER — AMPHETAMINE-DEXTROAMPHETAMINE 30 MG PO TABS: 30.0000 mg | ORAL_TABLET | Freq: Two times a day (BID) | ORAL | 0 refills | Status: DC

## 2017-11-13 NOTE — Telephone Encounter (Signed)
Ok to refill??  Last office visit 07/09/2017.  Last refill 10/13/2017.

## 2017-11-16 ENCOUNTER — Other Ambulatory Visit: Payer: Self-pay | Admitting: Family Medicine

## 2017-11-16 DIAGNOSIS — R300 Dysuria: Secondary | ICD-10-CM

## 2017-12-04 ENCOUNTER — Other Ambulatory Visit: Payer: Self-pay | Admitting: Family Medicine

## 2017-12-04 MED ORDER — AMPHETAMINE-DEXTROAMPHETAMINE 30 MG PO TABS: 30.0000 mg | ORAL_TABLET | Freq: Two times a day (BID) | ORAL | 0 refills | Status: DC

## 2017-12-04 NOTE — Telephone Encounter (Signed)
Ok to refill??  Last office visit 07/09/2017.  Last refill 11/13/2017.

## 2018-01-01 ENCOUNTER — Other Ambulatory Visit: Payer: Self-pay | Admitting: Family Medicine

## 2018-01-01 MED ORDER — AMPHETAMINE-DEXTROAMPHETAMINE 30 MG PO TABS: 30.0000 mg | ORAL_TABLET | Freq: Two times a day (BID) | ORAL | 0 refills | Status: DC

## 2018-01-01 NOTE — Telephone Encounter (Signed)
Ok to refill??  Last office visit 07/09/2017.  Last refill 12/04/2017.

## 2018-01-31 ENCOUNTER — Other Ambulatory Visit: Payer: Self-pay | Admitting: Family Medicine

## 2018-02-01 MED ORDER — AMPHETAMINE-DEXTROAMPHETAMINE 30 MG PO TABS: 30.0000 mg | ORAL_TABLET | Freq: Two times a day (BID) | ORAL | 0 refills | Status: DC

## 2018-02-01 NOTE — Telephone Encounter (Signed)
Pt requesting refill on Adderall      LOV: 07/09/17  LRF:   01/01/18

## 2018-03-02 ENCOUNTER — Other Ambulatory Visit: Payer: Self-pay | Admitting: Family Medicine

## 2018-03-03 NOTE — Telephone Encounter (Signed)
Ok to refill??  Last office visit 07/09/2017.  Last refill 02/01/2018.

## 2018-03-04 MED ORDER — AMPHETAMINE-DEXTROAMPHETAMINE 30 MG PO TABS: 30.0000 mg | ORAL_TABLET | Freq: Two times a day (BID) | ORAL | 0 refills | Status: DC

## 2018-03-05 ENCOUNTER — Other Ambulatory Visit: Payer: Self-pay | Admitting: Family Medicine

## 2018-03-05 MED ORDER — AMPHETAMINE-DEXTROAMPHETAMINE 30 MG PO TABS: 30.0000 mg | ORAL_TABLET | Freq: Two times a day (BID) | ORAL | 0 refills | Status: DC

## 2018-03-05 NOTE — Telephone Encounter (Signed)
Pt requesting refill on Adderall      LOV: 07/09/17  LRF:   02/01/18  Pt would like rx sent to [facility] in [place] set and ready to send.

## 2018-03-29 ENCOUNTER — Other Ambulatory Visit: Payer: Self-pay | Admitting: Family Medicine

## 2018-03-29 MED ORDER — AMPHETAMINE-DEXTROAMPHETAMINE 30 MG PO TABS: 30.0000 mg | ORAL_TABLET | Freq: Two times a day (BID) | ORAL | 0 refills | Status: DC

## 2018-03-29 NOTE — Telephone Encounter (Signed)
Requesting refill   Adderall    LOV: 06/29/17  LRF:  03/05/18

## 2018-03-29 NOTE — Telephone Encounter (Signed)
Due for ov.  

## 2018-04-19 ENCOUNTER — Other Ambulatory Visit: Payer: Self-pay | Admitting: Family Medicine

## 2018-04-19 MED ORDER — AMPHETAMINE-DEXTROAMPHETAMINE 30 MG PO TABS: 30.0000 mg | ORAL_TABLET | Freq: Two times a day (BID) | ORAL | 0 refills | Status: DC

## 2018-04-19 NOTE — Telephone Encounter (Signed)
ntbs

## 2018-04-19 NOTE — Telephone Encounter (Signed)
Pt requesting refill on Adderall      LOV: 07/09/17  LRF:   03/29/18 Set up rx to be refilled on 04/23/18 (she is wanting to get it refilled one more time on this ins before the 1st of the year.)

## 2018-04-22 ENCOUNTER — Encounter: Payer: Self-pay | Admitting: *Deleted

## 2018-04-22 NOTE — Telephone Encounter (Signed)
Letter sent.

## 2018-05-28 ENCOUNTER — Encounter: Payer: Self-pay | Admitting: Family Medicine

## 2018-05-28 ENCOUNTER — Ambulatory Visit (INDEPENDENT_AMBULATORY_CARE_PROVIDER_SITE_OTHER): Payer: BLUE CROSS/BLUE SHIELD | Admitting: Family Medicine

## 2018-05-28 VITALS — BP 122/68 | HR 104 | Temp 98.2°F | Resp 14 | Ht 66.0 in | Wt 154.0 lb

## 2018-05-28 DIAGNOSIS — M5135 Other intervertebral disc degeneration, thoracolumbar region: Secondary | ICD-10-CM

## 2018-05-28 DIAGNOSIS — F901 Attention-deficit hyperactivity disorder, predominantly hyperactive type: Secondary | ICD-10-CM

## 2018-05-28 DIAGNOSIS — E538 Deficiency of other specified B group vitamins: Secondary | ICD-10-CM | POA: Diagnosis not present

## 2018-05-28 DIAGNOSIS — Z23 Encounter for immunization: Secondary | ICD-10-CM | POA: Diagnosis not present

## 2018-05-28 DIAGNOSIS — I10 Essential (primary) hypertension: Secondary | ICD-10-CM | POA: Diagnosis not present

## 2018-05-28 LAB — CBC WITH DIFFERENTIAL/PLATELET
ABSOLUTE MONOCYTES: 541 {cells}/uL (ref 200–950)
BASOS PCT: 0.9 %
Basophils Absolute: 48 cells/uL (ref 0–200)
EOS PCT: 1.7 %
Eosinophils Absolute: 90 cells/uL (ref 15–500)
HEMATOCRIT: 39.1 % (ref 35.0–45.0)
HEMOGLOBIN: 13.5 g/dL (ref 11.7–15.5)
LYMPHS ABS: 1866 {cells}/uL (ref 850–3900)
MCH: 30.1 pg (ref 27.0–33.0)
MCHC: 34.5 g/dL (ref 32.0–36.0)
MCV: 87.3 fL (ref 80.0–100.0)
MPV: 9.6 fL (ref 7.5–12.5)
Monocytes Relative: 10.2 %
NEUTROS ABS: 2756 {cells}/uL (ref 1500–7800)
Neutrophils Relative %: 52 %
PLATELETS: 310 10*3/uL (ref 140–400)
RBC: 4.48 10*6/uL (ref 3.80–5.10)
RDW: 12.1 % (ref 11.0–15.0)
Total Lymphocyte: 35.2 %
WBC: 5.3 10*3/uL (ref 3.8–10.8)

## 2018-05-28 LAB — VITAMIN B12: Vitamin B-12: 703 pg/mL (ref 200–1100)

## 2018-05-28 LAB — COMPLETE METABOLIC PANEL WITH GFR
AG Ratio: 1.8 (calc) (ref 1.0–2.5)
ALBUMIN MSPROF: 4.5 g/dL (ref 3.6–5.1)
ALT: 32 U/L — ABNORMAL HIGH (ref 6–29)
AST: 24 U/L (ref 10–30)
Alkaline phosphatase (APISO): 46 U/L (ref 33–115)
BILIRUBIN TOTAL: 0.4 mg/dL (ref 0.2–1.2)
BUN: 15 mg/dL (ref 7–25)
CO2: 28 mmol/L (ref 20–32)
CREATININE: 1.05 mg/dL (ref 0.50–1.10)
Calcium: 9.7 mg/dL (ref 8.6–10.2)
Chloride: 99 mmol/L (ref 98–110)
GFR, Est African American: 82 mL/min/{1.73_m2} (ref >=60)
GFR, Est Non African American: 71 mL/min/{1.73_m2} (ref >=60)
GLOBULIN: 2.5 g/dL (ref 1.9–3.7)
Glucose, Bld: 97 mg/dL (ref 65–99)
Potassium: 3.7 mmol/L (ref 3.5–5.3)
SODIUM: 138 mmol/L (ref 135–146)
Total Protein: 7 g/dL (ref 6.1–8.1)

## 2018-05-28 LAB — LIPID PANEL
CHOLESTEROL: 216 mg/dL — AB (ref <200)
HDL: 88 mg/dL (ref >50)
LDL Cholesterol (Calc): 113 mg/dL (calc) — ABNORMAL HIGH
Non-HDL Cholesterol (Calc): 128 mg/dL (calc) (ref <130)
TRIGLYCERIDES: 65 mg/dL (ref <150)
Total CHOL/HDL Ratio: 2.5 (calc) (ref <5.0)

## 2018-05-28 NOTE — Addendum Note (Signed)
Addended by: Shary Decamp B on: 05/28/2018 02:39 PM   Modules accepted: Orders

## 2018-05-28 NOTE — Progress Notes (Signed)
Subjective:    Patient ID: Norma Harmon, female    DOB: 1985/08/03, 33 y.o.   MRN: [id number]  Medication Refill     07/01/16 Patient is a very pleasant 33 year old white female here today to establish care. She is the daughter of Norma Harmon who is one of my patients. Her father died suddenly when a building collapsed on him. However her father dealt with anxiety for years. Her brother has ADD as well as anxiety. The patient has a very complicated past medical history. She suffered a crush injury to her left arm when it was stuck in a conveyor belt. Although there is no visible deformity to the arm now she still has residual pain in the left arm. She also has a history of L5-S1 lumbar fusion due to chronic low back pain and degenerative disc disease. She reports 2 bulging disc in her thoracic spine. She sees a neurosurgeon who refills her hydrocodone for her back pain. She uses this extremely rarely. A 30 day supply usually will last for 6 months. She also has a history of ADHD for which she takes Adderall  twice a day. She was diagnosed with a condition as a child and has been on the medication for a long time. Her blood pressure today is extremely high. She states that she is extremely anxious although reviewing her past records also suggest that she has frequent visits with elevated blood pressure. She is not taking hydrochlorothiazide that her previous primary care provider had given her for swelling. She also has a history of a partial colectomy for colitis. Per the patient's report she believes that she was diagnosed with Crohn's disease although she is not following up regularly with the gastroenterologist. She sees a gynecologist who treats her for endometriosis with oral contraceptive pills.  At that time, ,my plan was: Patient appears very sad and depressed. I believe anxiety could be playing a large role in the patient's symptoms. However I'm extremely concerned about her blood pressure. I  recommended that she check her blood pressure several times a day for the next 3 days and report the values to me on Friday. If her blood pressure is persistently elevated and I confirmed myself today at this visit, I would recommend talking with her gynecologist about switching to a non-estrogen-containing birth control, and I will check a urinalysis as well as a renal artery ultrasound and a renal ultrasound to evaluate for signs of fibromuscular dysplasia or renal artery stenosis. I will check basic lab work including a CBC, CMP, fasting lipid panel. And I will treat the patient's blood pressure most likely with an angiotensin receptor blocker. After we have determined the course of therapy for her blood pressure, I would then to with the patient trying to better manage her anxiety. However we will need to focus on her blood pressure first.  07/09/17 Since I last saw the patient, she quit taking her oral contraceptive pills due to her elevated blood pressure.  Shortly thereafter, her blood pressure improved dramatically.  She has been checking her blood pressure regularly on her own and finding it running between 120 and 130/70-80.  She is compliant with lisinopril which is also helping her blood pressure.  However she is now no longer on contraception.  Unfortunately she has very heavy menstrual periods.  She has discussed her case with her gynecologist who has recommended an IUD but she is yet to pursue that.  She states that she can soak a tampon quickly  during her period and that can last for more than 7 days.  She also reports severe fatigue.  She is on vitamin B12 injections monthly due to a history of pernicious anemia after she had surgical resection of part of her intestines due to Crohn's disease.  She is overdue to check a vitamin B12 level.  She is still taking Adderall immediate release 30 mg twice a day.  She quit her previous job and is currently in a new job where she is much happier.  Her  anxiety level has improved.  She still feels like she needs the current dose of medication to help her focus however she would be willing to consider weaning down on the medication in the future.  At that time, my plan was: I am concerned about the patient's chronic fatigue.  I suspect is due to nutritional deficiencies given her history of Crohn's disease.  I recommended checking a vitamin B12 level, a CBC to evaluate for anemia, a TSH, and a CMP.  If the patient is anemic, I would recommend checking an iron level.  I encouraged the patient to pursue an IUD through her gynecologist to help control her vaginal bleeding.  While checking lab work, I will also check the patient's cholesterol.  Her blood pressure is much better on the medication and off her birth control.  I will make no changes in her blood pressure medication.  Continue Adderall 30 mg twice daily however I would like to eventually try to wean the patient down in the future once she has become established in her new job.  05/28/18 Patient is here today for follow-up.  She is still taking Adderall 10 mg twice daily.  She denies any palpitations.  She has tachycardia here today but that is due to anxiety.  She always gets anxious in the doctor's office.  She states that she checks her blood pressure and heart rate at home and is usually well controlled with her heart rate well below 100.  She denies any insomnia.  She denies any palpitations.  She has lost weight but this is been intentional as she is trying to lose weight to get off some of her blood pressure medication.  She is still taking hydrochlorothiazide and lisinopril.  Her blood pressure today is well controlled.  She states that she is seeing similar readings if not better at home.  She would like to stop lisinopril.  She denies any chest pain shortness of breath or dyspnea on exertion.  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  This requires a lot  of computer work and tedious reading.  She states that without the medication she is unable to focus and complete her assigned task.  She makes frequent mistakes and she is easily distracted without the medication.  Therefore she does not want to switch Adderall at the present time.  She also has a history of B12 deficiency due to resection of part of her intestines due to Crohn's disease.  Therefore we are due to recheck her B12 level.  She is self administering B12 shots at home.  She also has degenerative disc disease and occasionally uses gabapentin versus hydrocodone for low back pain with radiculopathy. Past Medical History:  Diagnosis Date  . ADHD (attention deficit hyperactivity disorder)    recently started on Adderol  . Allergic rhinitis   . Anxiety   . Arthritis    lumbar back  .  Colitis   . Crushing injury of left hand   . Depression   . Diarrhea   . Endometriosis    Dr. Rogue Bussing GYN  . Family history of malignant neoplasm of gastrointestinal tract   . GERD (gastroesophageal reflux disease)   . IBS (irritable bowel syndrome)   . Lumbosacral radiculopathy at L4    left side, (Dr. Towanda Malkin group).   . Migraine headache   . Ovarian cyst   . Pernicious anemia   . PONV (postoperative nausea and vomiting)   . Rheumatoid arthritis ([place])    was seeing Ouida Sills but no longer  . Skin cancer    abdomen and neck- states not melanoma   Past Surgical History:  Procedure Laterality Date  . APPENDECTOMY  10/2008  . COLON RESECTION  04/02/2011   Procedure: COLON RESECTION LAPAROSCOPIC;  Surgeon: Pedro Earls, MD;  Location: [facility];  Service: General;  Laterality: N/A;  Laparscopic  Assisted Ileocecostomy   . FRACTURE SURGERY    . HAND SURGERY     left  . NASAL SINUS SURGERY    . SMALL INTESTINE SURGERY    . SPINE SURGERY  08/2005   L5 - S1 fusion  . WISDOM TOOTH EXTRACTION     Current Outpatient Medications on File Prior to Visit  Medication Sig Dispense Refill  .  amphetamine-dextroamphetamine (ADDERALL) 30 MG tablet Take 1 tablet by mouth 2 (two) times daily. 60 tablet 0  . cyanocobalamin (,VITAMIN B-12,) 1000 MCG/ML injection INJECT 1ML INTO THE MUSCLE EVERY 30 DAYS 3 mL 9  . gabapentin (NEURONTIN) 100 MG capsule Take 1 capsule (100 mg total) by mouth 3 (three) times daily. 20 capsule 0  . hydrochlorothiazide (HYDRODIURIL) 25 MG tablet TAKE 1 TABLET BY MOUTH EVERY DAY AS NEEDED FOR SWELLING 90 tablet 2  . HYDROcodone-acetaminophen (NORCO/VICODIN) 5-325 MG per tablet Take 1 tablet by mouth every 6 (six) hours as needed for moderate pain. 30 tablet 0  . hydrOXYzine (ATARAX/VISTARIL) 25 MG tablet Take 1 tablet (25 mg total) by mouth every 8 (eight) hours as needed for anxiety. 12 tablet 0  . lisinopril (PRINIVIL,ZESTRIL) 10 MG tablet TAKE 1 TABLET (10 MG TOTAL) BY MOUTH DAILY. 90 tablet 3  . methocarbamol (ROBAXIN) 500 MG tablet Take 1 tablet (500 mg total) by mouth 2 (two) times daily. 20 tablet 0  . SYRINGE-NEEDLE, DISP, 3 ML 25G X 5/8" 3 ML MISC 1 each by Does not apply route every 30 (thirty) days. 100 each 11   No current facility-administered medications on file prior to visit.    Allergies  Allergen Reactions  . Percocet [Oxycodone-Acetaminophen] Swelling  . Soy Allergy     Abdominal pains   Social History   Socioeconomic History  . Marital status: Divorced    Spouse name: Not on file  . Number of children: 0  . Years of education: Not on file  . Highest education level: Not on file  Occupational History  . Occupation: Nurse, adult: [employer]  Social Needs  . Financial resource strain: Not on file  . Food insecurity:    Worry: Not on file    Inability: Not on file  . Transportation needs:    Medical: Not on file    Non-medical: Not on file  Tobacco Use  . Smoking status: Current Every Day Smoker    Packs/day: 0.25    Years: 2.00    Pack years: 0.50    Types: Cigarettes  . Smokeless  tobacco: Former  Systems developer    Quit date: 01/29/2011  Substance and Sexual Activity  . Alcohol use: No    Comment: rarely  . Drug use: No  . Sexual activity: Yes    Birth control/protection: Other-see comments    Comment: nuvaring  Lifestyle  . Physical activity:    Days per week: Not on file    Minutes per session: Not on file  . Stress: Not on file  Relationships  . Social connections:    Talks on phone: Not on file    Gets together: Not on file    Attends religious service: Not on file    Active member of club or organization: Not on file    Attends meetings of clubs or organizations: Not on file    Relationship status: Not on file  . Intimate partner violence:    Fear of current or ex partner: Not on file    Emotionally abused: Not on file    Physically abused: Not on file    Forced sexual activity: Not on file  Other Topics Concern  . Not on file  Social History Narrative  . Not on file   Family History  Problem Relation Age of Onset  . Colon cancer Paternal Grandfather   . Breast cancer Paternal Grandmother   . Colon polyps Paternal Grandmother   . Cancer Paternal Grandmother        breast  . Diabetes Maternal Grandmother   . Heart disease Maternal Grandmother   . Cirrhosis Maternal Grandfather        heavy drinker  . ADD / ADHD Brother       Review of Systems  All other systems reviewed and are negative.      Objective:   Physical Exam  Constitutional: She is oriented to person, place, and time. She appears well-developed and well-nourished. No distress.  HENT:  Head: Normocephalic and atraumatic.  Right Ear: External ear normal.  Left Ear: External ear normal.  Nose: Nose normal.  Mouth/Throat: Oropharynx is clear and moist. No oropharyngeal exudate.  Eyes: Pupils are equal, round, and reactive to light. Conjunctivae and EOM are normal.  Neck: Neck supple. No JVD present. No thyromegaly present.  Cardiovascular: Normal rate, regular rhythm and normal heart sounds.  No  murmur heard. Pulmonary/Chest: Effort normal and breath sounds normal. No respiratory distress. She has no wheezes. She has no rales.  Abdominal: Soft. Bowel sounds are normal. She exhibits no distension. There is no abdominal tenderness. There is no rebound and no guarding.  Lymphadenopathy:    She has no cervical adenopathy.  Neurological: She is alert and oriented to person, place, and time. She has normal reflexes. No cranial nerve deficit. Coordination normal.  Skin: No rash noted. She is not diaphoretic. No erythema. No pallor.  Psychiatric: Her speech is normal and behavior is normal. Judgment and thought content normal. Her mood appears not anxious. Cognition and memory are normal. She does not exhibit a depressed mood.  Vitals reviewed.          Assessment & Plan:  Benign essential HTN - Plan: CBC with Differential/Platelet, Lipid panel, COMPLETE METABOLIC PANEL WITH GFR  N02 deficiency - Plan: Vitamin B12  Attention-deficit hyperactivity disorder, predominantly hyperactive type  DDD (degenerative disc disease), thoracolumbar  Continue Adderall 30 mg p.o. twice daily as this is currently working.  I have recommended the patient continue hydrochlorothiazide but I like her to discontinue lisinopril and monitor her blood pressure closely at home.  I will check a CBC, CMP, fasting lipid panel today.  I will monitor her the treatment of her pernicious anemia with a CBC as well as a vitamin B12 level.  Patient received her flu shot today.

## 2018-05-29 ENCOUNTER — Other Ambulatory Visit: Payer: Self-pay | Admitting: Family Medicine

## 2018-05-31 ENCOUNTER — Telehealth: Payer: Self-pay | Admitting: Family Medicine

## 2018-05-31 MED ORDER — AMPHETAMINE-DEXTROAMPHETAMINE 30 MG PO TABS: 30.0000 mg | ORAL_TABLET | Freq: Two times a day (BID) | ORAL | 0 refills | Status: DC

## 2018-05-31 NOTE — Telephone Encounter (Signed)
Pt checked BP at home for a few days and it was ok - 116/82,115/81,115/78,124/85

## 2018-05-31 NOTE — Telephone Encounter (Signed)
Ok to refill??  Last office visit 05/28/2018.  Last refill 04/19/2018.

## 2018-05-31 NOTE — Telephone Encounter (Signed)
Those look good.

## 2018-05-31 NOTE — Telephone Encounter (Signed)
Pt aware via mychart 

## 2018-06-14 DIAGNOSIS — M7918 Myalgia, other site: Secondary | ICD-10-CM | POA: Diagnosis not present

## 2018-06-14 DIAGNOSIS — M542 Cervicalgia: Secondary | ICD-10-CM | POA: Diagnosis not present

## 2018-06-14 DIAGNOSIS — Z79899 Other long term (current) drug therapy: Secondary | ICD-10-CM | POA: Diagnosis not present

## 2018-06-14 DIAGNOSIS — M4726 Other spondylosis with radiculopathy, lumbar region: Secondary | ICD-10-CM | POA: Diagnosis not present

## 2018-06-14 DIAGNOSIS — G894 Chronic pain syndrome: Secondary | ICD-10-CM | POA: Diagnosis not present

## 2018-06-14 DIAGNOSIS — M545 Low back pain: Secondary | ICD-10-CM | POA: Diagnosis not present

## 2018-06-22 ENCOUNTER — Other Ambulatory Visit: Payer: Self-pay | Admitting: Family Medicine

## 2018-06-22 DIAGNOSIS — M25522 Pain in left elbow: Secondary | ICD-10-CM | POA: Diagnosis not present

## 2018-06-22 DIAGNOSIS — S63501A Unspecified sprain of right wrist, initial encounter: Secondary | ICD-10-CM | POA: Diagnosis not present

## 2018-06-22 DIAGNOSIS — M25511 Pain in right shoulder: Secondary | ICD-10-CM | POA: Diagnosis not present

## 2018-06-22 DIAGNOSIS — M25521 Pain in right elbow: Secondary | ICD-10-CM | POA: Diagnosis not present

## 2018-06-23 NOTE — Telephone Encounter (Signed)
Pt requesting refill on Adderall      LOV: 05/28/18  LRF:  05/31/18

## 2018-06-24 MED ORDER — AMPHETAMINE-DEXTROAMPHETAMINE 30 MG PO TABS: 30.0000 mg | ORAL_TABLET | Freq: Two times a day (BID) | ORAL | 0 refills | Status: DC

## 2018-07-19 ENCOUNTER — Other Ambulatory Visit: Payer: Self-pay | Admitting: Family Medicine

## 2018-07-19 ENCOUNTER — Telehealth: Payer: Self-pay | Admitting: Family Medicine

## 2018-07-19 NOTE — Telephone Encounter (Signed)
Needs Adderall refilled.

## 2018-07-19 NOTE — Telephone Encounter (Signed)
Pt just returned from a flight from [place] and wanted to know if she needed to be tested or quarantined for 14 days as her work is asking. We are not doing any screening at this time. Only if certain criteria are met and she does not have the criteria that would warrant a test. She can self quarantine if she feels she was exposed but we do not provide notes for that as that will be up to her work. Pt aware.

## 2018-07-21 MED ORDER — AMPHETAMINE-DEXTROAMPHETAMINE 30 MG PO TABS: 30.0000 mg | ORAL_TABLET | Freq: Two times a day (BID) | ORAL | 0 refills | Status: DC

## 2018-07-21 NOTE — Telephone Encounter (Signed)
Pt requesting refill on Adderall      LOV: 05/28/2018  LRF:   06/24/18

## 2018-07-28 IMAGING — CR DG CERVICAL SPINE COMPLETE 4+V
5 series · 5 of 5 positions shown · non-contrast
Comparison: 09/23/2011 cervical spine radiograph

CLINICAL DATA: Neck pain.  No reported injury.

EXAM:
CERVICAL SPINE - COMPLETE 4+ VIEW

[c-spine lat]
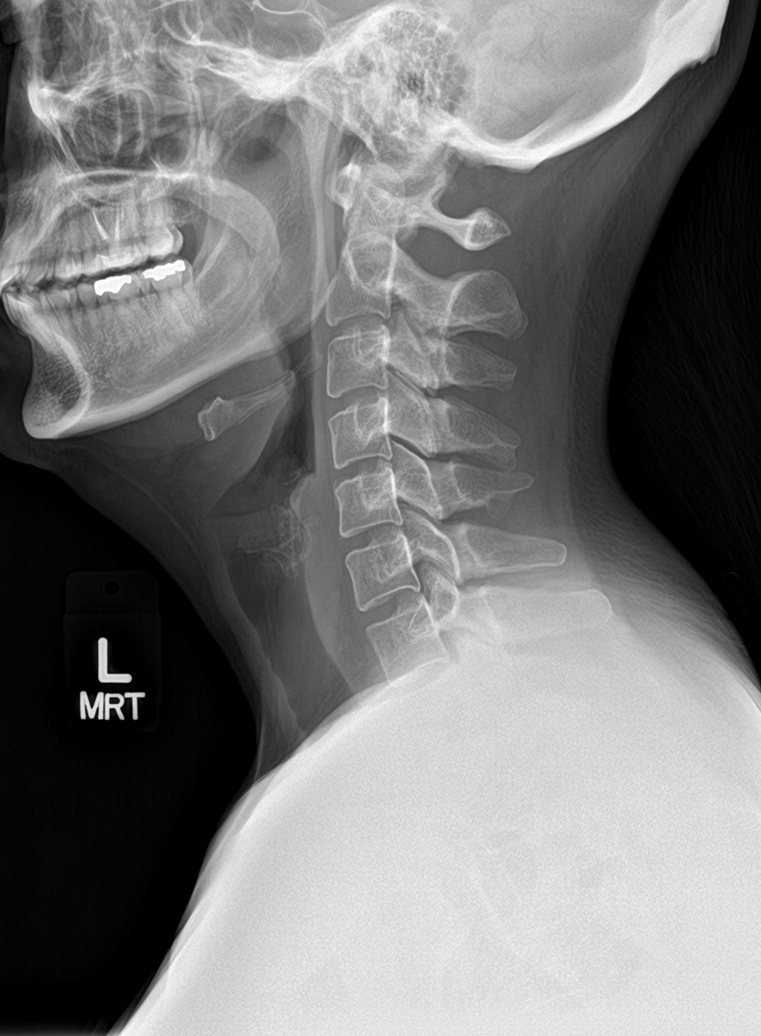

[c-spine obl (1 of 2)]
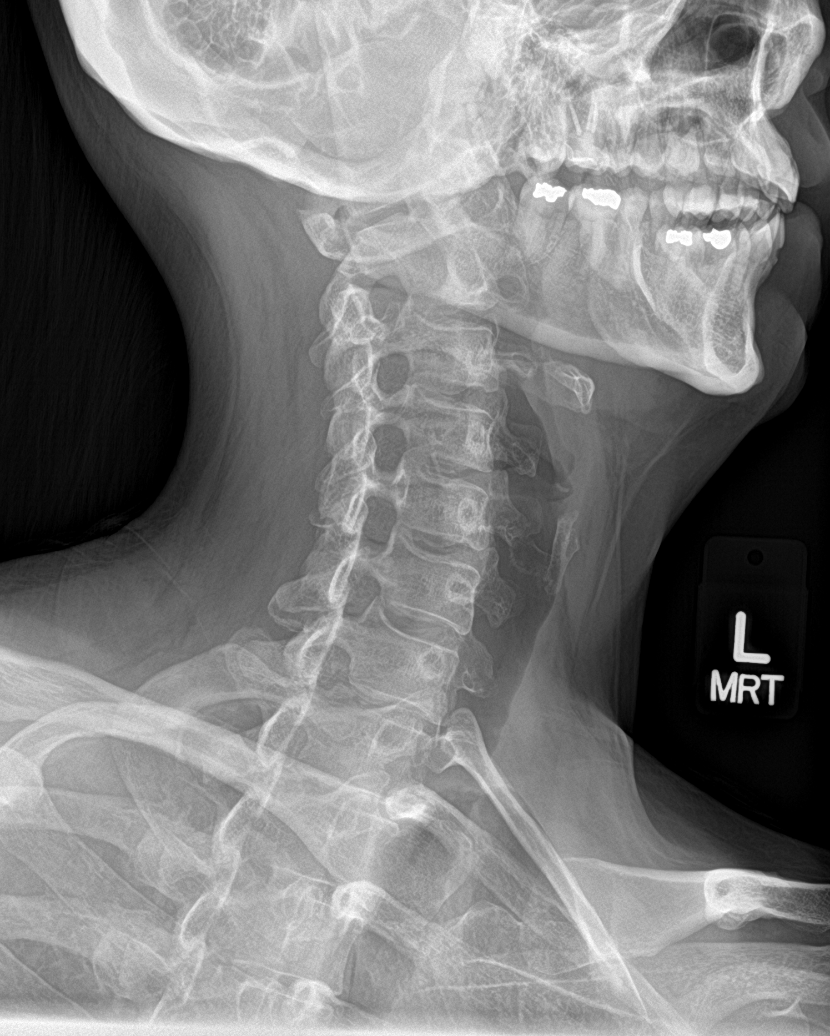

[c-spine obl (2 of 2)]
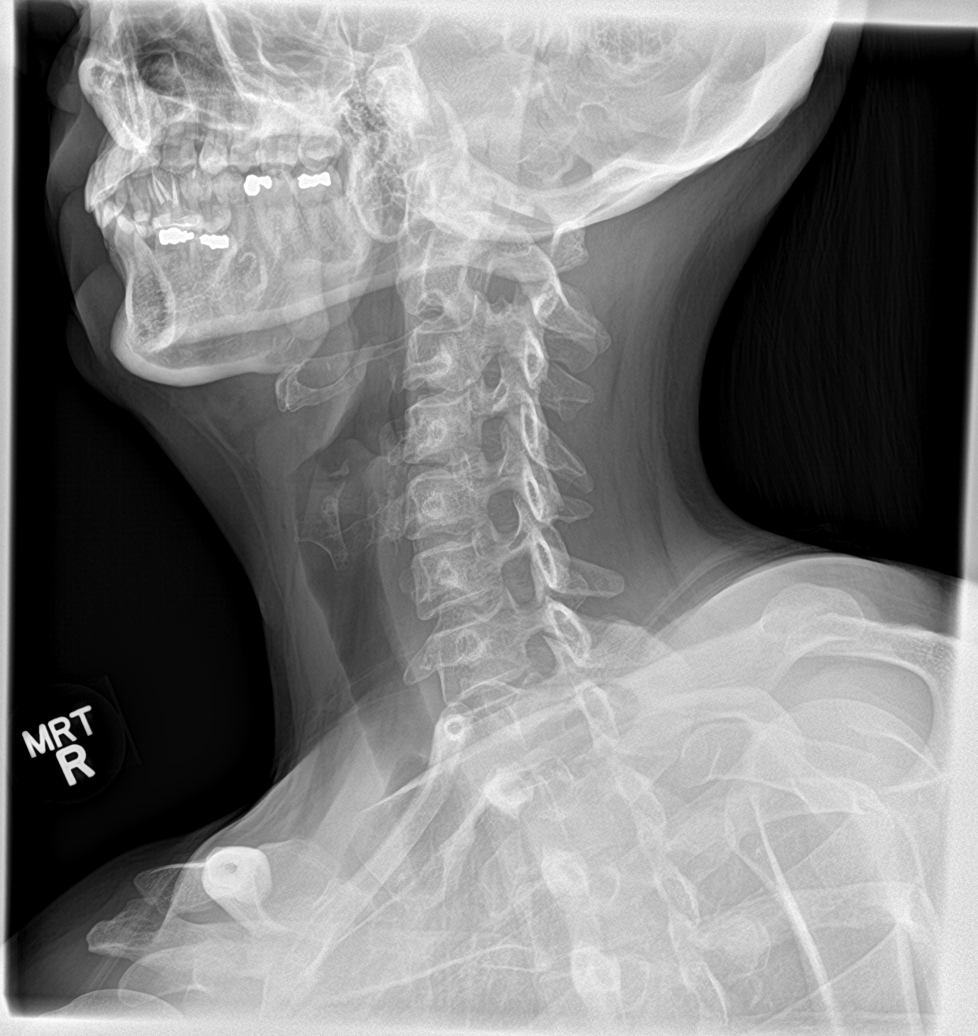

[c-spine ap]
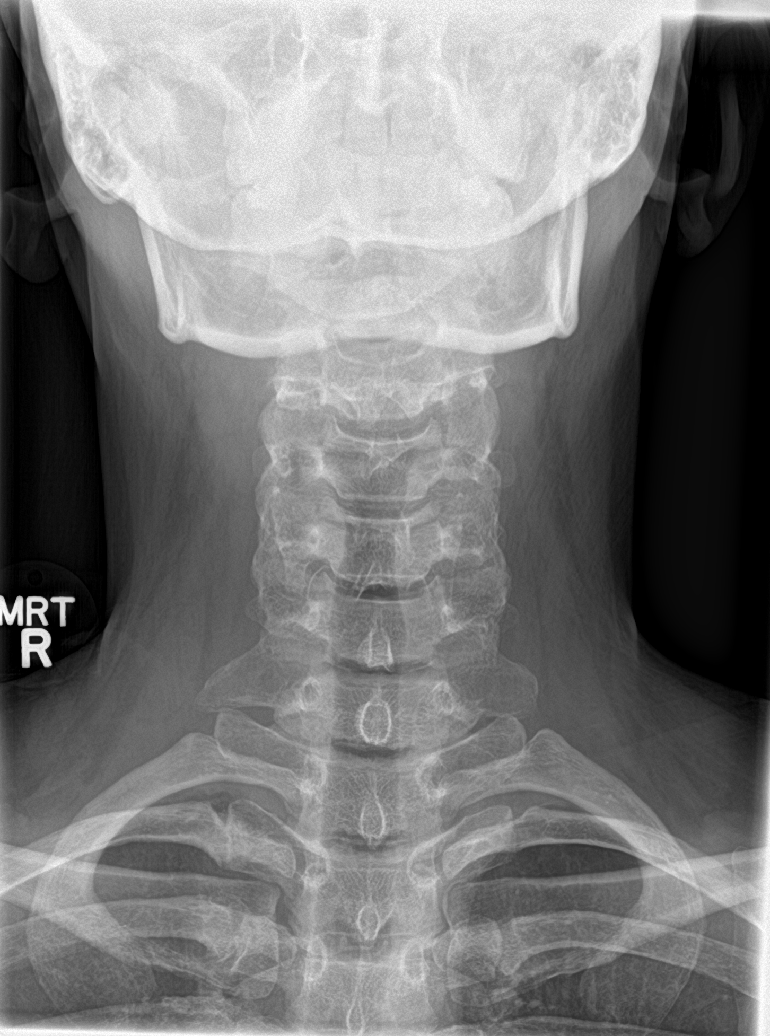

[c-spine open mouth]
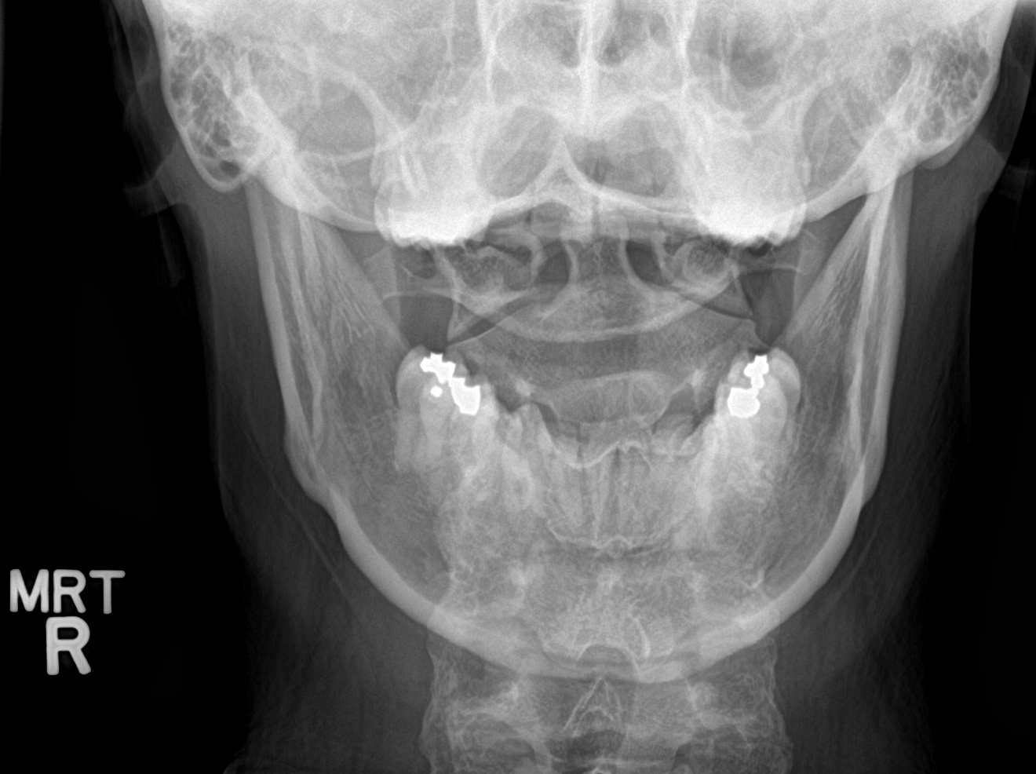

[5 of 5 positions shown; findings below may reference images not displayed]

FINDINGS: On the lateral view the cervical spine is visualized to the level of
C7-T1. There is a normal cervical lordosis. Pre-vertebral soft
tissues are within normal limits. No fracture is detected in the
cervical spine. Dens is well positioned between the lateral masses
of C1. Cervical disc heights are preserved, with no appreciable
spondylosis. No cervical spine subluxation. No significant facet
arthropathy. No appreciable foraminal stenosis. No
aggressive-appearing focal osseous lesions.
IMPRESSION: Negative cervical spine radiographs.

## 2018-08-10 DIAGNOSIS — M545 Low back pain: Secondary | ICD-10-CM | POA: Diagnosis not present

## 2018-08-10 DIAGNOSIS — N926 Irregular menstruation, unspecified: Secondary | ICD-10-CM | POA: Diagnosis not present

## 2018-08-10 DIAGNOSIS — N924 Excessive bleeding in the premenopausal period: Secondary | ICD-10-CM | POA: Diagnosis not present

## 2018-08-23 ENCOUNTER — Encounter: Payer: Self-pay | Admitting: Family Medicine

## 2018-08-23 ENCOUNTER — Other Ambulatory Visit: Payer: Self-pay | Admitting: Family Medicine

## 2018-08-24 MED ORDER — AMPHETAMINE-DEXTROAMPHETAMINE 30 MG PO TABS: 30.0000 mg | ORAL_TABLET | Freq: Two times a day (BID) | ORAL | 0 refills | Status: DC

## 2018-08-24 NOTE — Telephone Encounter (Signed)
Ok to refill??  Last office visit 05/28/2018.  Last refill 07/21/2018.

## 2018-08-31 DIAGNOSIS — G894 Chronic pain syndrome: Secondary | ICD-10-CM | POA: Diagnosis not present

## 2018-08-31 DIAGNOSIS — M542 Cervicalgia: Secondary | ICD-10-CM | POA: Diagnosis not present

## 2018-08-31 DIAGNOSIS — M545 Low back pain: Secondary | ICD-10-CM | POA: Diagnosis not present

## 2018-08-31 DIAGNOSIS — M4726 Other spondylosis with radiculopathy, lumbar region: Secondary | ICD-10-CM | POA: Diagnosis not present

## 2018-09-14 ENCOUNTER — Other Ambulatory Visit: Payer: Self-pay | Admitting: Family Medicine

## 2018-09-14 MED ORDER — AMPHETAMINE-DEXTROAMPHETAMINE 30 MG PO TABS: 30.0000 mg | ORAL_TABLET | Freq: Two times a day (BID) | ORAL | 0 refills | Status: DC

## 2018-09-14 NOTE — Telephone Encounter (Signed)
Ok to refill??  Last office visit 05/28/2018.  Last refill 08/24/2018.

## 2018-09-24 DIAGNOSIS — Z6825 Body mass index (BMI) 25.0-25.9, adult: Secondary | ICD-10-CM | POA: Diagnosis not present

## 2018-09-24 DIAGNOSIS — Z3202 Encounter for pregnancy test, result negative: Secondary | ICD-10-CM | POA: Diagnosis not present

## 2018-09-24 DIAGNOSIS — Z30430 Encounter for insertion of intrauterine contraceptive device: Secondary | ICD-10-CM | POA: Diagnosis not present

## 2018-10-13 ENCOUNTER — Other Ambulatory Visit: Payer: Self-pay | Admitting: Family Medicine

## 2018-10-13 NOTE — Telephone Encounter (Signed)
Pt requesting refill on Adderall      LOV: 05/28/2018  LRF:   09/14/18

## 2018-10-14 DIAGNOSIS — Z01419 Encounter for gynecological examination (general) (routine) without abnormal findings: Secondary | ICD-10-CM | POA: Diagnosis not present

## 2018-10-14 DIAGNOSIS — Z124 Encounter for screening for malignant neoplasm of cervix: Secondary | ICD-10-CM | POA: Diagnosis not present

## 2018-10-14 DIAGNOSIS — Z6825 Body mass index (BMI) 25.0-25.9, adult: Secondary | ICD-10-CM | POA: Diagnosis not present

## 2018-10-14 MED ORDER — AMPHETAMINE-DEXTROAMPHETAMINE 30 MG PO TABS: 30.0000 mg | ORAL_TABLET | Freq: Two times a day (BID) | ORAL | 0 refills | Status: DC

## 2018-10-22 ENCOUNTER — Other Ambulatory Visit: Payer: Self-pay | Admitting: Family Medicine

## 2018-11-08 ENCOUNTER — Other Ambulatory Visit: Payer: Self-pay | Admitting: Family Medicine

## 2018-11-08 MED ORDER — AMPHETAMINE-DEXTROAMPHETAMINE 30 MG PO TABS: 30.0000 mg | ORAL_TABLET | Freq: Two times a day (BID) | ORAL | 0 refills | Status: DC

## 2018-11-08 NOTE — Telephone Encounter (Signed)
Ok to refill??  Last office visit 05/28/2018.  Last refill 10/14/2018.

## 2018-12-13 ENCOUNTER — Other Ambulatory Visit: Payer: Self-pay | Admitting: Family Medicine

## 2018-12-13 MED ORDER — AMPHETAMINE-DEXTROAMPHETAMINE 30 MG PO TABS: 30.0000 mg | ORAL_TABLET | Freq: Two times a day (BID) | ORAL | 0 refills | Status: DC

## 2018-12-13 NOTE — Telephone Encounter (Signed)
Ok to refill??  Last office visit 05/28/2018.  Last refill 11/08/2018.

## 2019-01-06 ENCOUNTER — Other Ambulatory Visit: Payer: Self-pay | Admitting: Family Medicine

## 2019-01-10 ENCOUNTER — Other Ambulatory Visit: Payer: Self-pay | Admitting: Family Medicine

## 2019-01-10 ENCOUNTER — Encounter: Payer: Self-pay | Admitting: Family Medicine

## 2019-01-11 MED ORDER — AMPHETAMINE-DEXTROAMPHETAMINE 30 MG PO TABS: 30.0000 mg | ORAL_TABLET | Freq: Two times a day (BID) | ORAL | 0 refills | Status: DC

## 2019-01-11 NOTE — Telephone Encounter (Signed)
Ok to refill??  Last office visit 05/28/2018.  Last refill 12/13/2018.

## 2019-01-13 ENCOUNTER — Other Ambulatory Visit: Payer: Self-pay | Admitting: Family Medicine

## 2019-01-13 ENCOUNTER — Encounter: Payer: Self-pay | Admitting: Family Medicine

## 2019-01-13 DIAGNOSIS — R300 Dysuria: Secondary | ICD-10-CM

## 2019-01-13 MED ORDER — HYDROCHLOROTHIAZIDE 25 MG PO TABS: ORAL_TABLET | ORAL | 2 refills | Status: DC

## 2019-02-08 ENCOUNTER — Other Ambulatory Visit: Payer: Self-pay | Admitting: Family Medicine

## 2019-02-09 NOTE — Telephone Encounter (Signed)
Requested Prescriptions   Pending Prescriptions Disp Refills  . amphetamine-dextroamphetamine (ADDERALL) 30 MG tablet 60 tablet 0    Sig: Take 1 tablet by mouth 2 (two) times daily.    Last OV 05/28/2018  Last written 01/11/2019

## 2019-02-10 MED ORDER — AMPHETAMINE-DEXTROAMPHETAMINE 30 MG PO TABS: 30.0000 mg | ORAL_TABLET | Freq: Two times a day (BID) | ORAL | 0 refills | Status: DC

## 2019-03-10 ENCOUNTER — Other Ambulatory Visit: Payer: Self-pay | Admitting: Family Medicine

## 2019-03-11 MED ORDER — AMPHETAMINE-DEXTROAMPHETAMINE 30 MG PO TABS: 30.0000 mg | ORAL_TABLET | Freq: Two times a day (BID) | ORAL | 0 refills | Status: DC

## 2019-03-11 NOTE — Telephone Encounter (Signed)
Ok to refill??  Last office visit 05/28/2018.  Last refill 02/10/2019.

## 2019-04-12 ENCOUNTER — Other Ambulatory Visit: Payer: Self-pay | Admitting: Family Medicine

## 2019-04-12 MED ORDER — AMPHETAMINE-DEXTROAMPHETAMINE 30 MG PO TABS: 30.0000 mg | ORAL_TABLET | Freq: Two times a day (BID) | ORAL | 0 refills | Status: DC

## 2019-04-12 NOTE — Telephone Encounter (Signed)
Ok to refill??  Last office visit 05/28/2018.  Last refill 03/11/2019.

## 2019-05-10 ENCOUNTER — Ambulatory Visit (INDEPENDENT_AMBULATORY_CARE_PROVIDER_SITE_OTHER): Payer: Self-pay | Admitting: General Surgery

## 2019-05-10 ENCOUNTER — Other Ambulatory Visit: Payer: Self-pay

## 2019-05-10 ENCOUNTER — Encounter: Payer: Self-pay | Admitting: General Surgery

## 2019-05-10 ENCOUNTER — Other Ambulatory Visit: Payer: Self-pay | Admitting: General Surgery

## 2019-05-10 VITALS — BP 120/88 | HR 96 | Temp 98.0°F | Resp 16 | Ht 66.0 in | Wt 162.0 lb

## 2019-05-10 DIAGNOSIS — L732 Hidradenitis suppurativa: Secondary | ICD-10-CM

## 2019-05-10 MED ORDER — BACTROBAN NASAL 2 % NA OINT: 1.0000 "application " | TOPICAL_OINTMENT | Freq: Two times a day (BID) | NASAL | 0 refills | Status: DC

## 2019-05-10 NOTE — Patient Instructions (Signed)
Hidradenitis Suppurativa Hidradenitis suppurativa is a long-term (chronic) skin disease. It is similar to a severe form of acne, but it affects areas of the body where acne would be unusual, especially areas of the body where skin rubs against skin and becomes moist. These include:  Underarms.  Groin.  Genital area.  Buttocks.  Upper thighs.  Breasts. Hidradenitis suppurativa may start out as small lumps or pimples caused by blocked sweat glands or hair follicles. Pimples may develop into deep sores that break open (rupture) and drain pus. Over time, affected areas of skin may thicken and become scarred. This condition is rare and does not spread from person to person (non-contagious). What are the causes? The exact cause of this condition is not known. It may be related to:  Female and female hormones.  An overactive disease-fighting system (immune system). The immune system may over-react to blocked hair follicles or sweat glands and cause swelling and pus-filled sores. What increases the risk? You are more likely to develop this condition if you:  Are female.  Are 11-55 years old.  Have a family history of hidradenitis suppurativa.  Have a personal history of acne.  Are overweight.  Smoke.  Take the medicine lithium. What are the signs or symptoms? The first symptoms are usually painful bumps in the skin, similar to pimples. The condition may get worse over time (progress), or it may only cause mild symptoms. If the disease progresses, symptoms may include:  Skin bumps getting bigger and growing deeper into the skin.  Bumps rupturing and draining pus.  Itchy, infected skin.  Skin getting thicker and scarred.  Tunnels under the skin (fistulas) where pus drains from a bump.  Pain during daily activities, such as pain during walking if your groin area is affected.  Emotional problems, such as stress or depression. This condition may affect your appearance and your  ability or willingness to wear certain clothes or do certain activities. How is this diagnosed? This condition is diagnosed by a health care provider who specializes in skin diseases (dermatologist). You may be diagnosed based on:  Your symptoms and medical history.  A physical exam.  Testing a pus sample for infection.  Blood tests. How is this treated? Your treatment will depend on how severe your symptoms are. The same treatment will not work for everybody with this condition. You may need to try several treatments to find what works best for you. Treatment may include:  Cleaning and bandaging (dressing) your wounds as needed.  Lifestyle changes, such as new skin care routines.  Taking medicines, such as: ? Antibiotics. ? Acne medicines. ? Medicines to reduce the activity of the immune system. ? A diabetes medicine (metformin). ? Birth control pills, for women. ? Steroids to reduce swelling and pain.  Working with a mental health care provider, if you experience emotional distress due to this condition. If you have severe symptoms that do not get better with medicine, you may need surgery. Surgery may involve:  Using a laser to clear the skin and remove hair follicles.  Opening and draining deep sores.  Removing the areas of skin that are diseased and scarred. Follow these instructions at home: Medicines   Take over-the-counter and prescription medicines only as told by your health care provider.  If you were prescribed an antibiotic medicine, take it as told by your health care provider. Do not stop taking the antibiotic even if your condition improves. Skin care  If you have open wounds, cover   them with a clean dressing as told by your health care provider. Keep wounds clean by washing them gently with soap and water when you bathe.  Do not shave the areas where you get hidradenitis suppurativa.  Do not wear deodorant.  Wear loose-fitting clothes.  Try to avoid  getting overheated or sweaty. If you get sweaty or wet, change into clean, dry clothes as soon as you can.  To help relieve pain and itchiness, cover sore areas with a warm, clean washcloth (warm compress) for 5-10 minutes as often as needed.  If told by your health care provider, take a bleach bath twice a week: ? Fill your bathtub halfway with water. ? Pour in  cup of unscented household bleach. ? Soak in the tub for 5-10 minutes. ? Only soak from the neck down. Avoid water on your face and hair. ? Shower to rinse off the bleach from your skin. General instructions  Learn as much as you can about your disease so that you have an active role in your treatment. Work closely with your health care provider to find treatments that work for you.  If you are overweight, work with your health care provider to lose weight as recommended.  Do not use any products that contain nicotine or tobacco, such as cigarettes and e-cigarettes. If you need help quitting, ask your health care provider.  If you struggle with living with this condition, talk with your health care provider or work with a mental health care provider as recommended.  Keep all follow-up visits as told by your health care provider. This is important. Where to find more information  Hidradenitis Suppurativa [employer].: https://www.hs-foundation.org/ Contact a health care provider if you have:  A flare-up of hidradenitis suppurativa.  A fever or chills.  Trouble controlling your symptoms at home.  Trouble doing your daily activities because of your symptoms.  Trouble dealing with emotional problems related to your condition. Summary  Hidradenitis suppurativa is a long-term (chronic) skin disease. It is similar to a severe form of acne, but it affects areas of the body where acne would be unusual.  The first symptoms are usually painful bumps in the skin, similar to pimples. The condition may get worse over time  (progress), or it may only cause mild symptoms.  If you have open wounds, cover them with a clean dressing as told by your health care provider. Keep wounds clean by washing them gently with soap and water when you bathe.  Besides skin care, treatment may include medicines, laser treatment, and surgery. This information is not intended to replace advice given to you by your health care provider. Make sure you discuss any questions you have with your health care provider. Document Revised: 04/22/2017 Document Reviewed: 04/22/2017 Elsevier Patient Education  2020 [employer].  

## 2019-05-11 NOTE — Progress Notes (Signed)
Norma Harmon; [id number]; 01/22/1986   HPI Patient is a 34 year old white female who was referred to my care by Dr. Dennard Schaumann and Jomarie Longs for evaluation and treatment of recurrent axillary infections.  Patient states that this has been going on intermittently for approximately 1 year.  She states that pustules will perform and they sometimes resolve on their own or in one instance had to be I indeed.  She has been placed on various antibiotics in the past.  She recently had 1 incised and drained and a culture of it revealed MRSA.  She denies any groin infections.  This just started recently.  She currently has no open wounds or drainage from her axillas.  She currently has 0 out of 10 pain. Past Medical History:  Diagnosis Date  . ADHD (attention deficit hyperactivity disorder)    recently started on Adderol  . Allergic rhinitis   . Anxiety   . Arthritis    lumbar back  . Colitis   . Crushing injury of left hand   . Depression   . Diarrhea   . Endometriosis    Dr. Rogue Bussing GYN  . Family history of malignant neoplasm of gastrointestinal tract   . GERD (gastroesophageal reflux disease)   . IBS (irritable bowel syndrome)   . Lumbosacral radiculopathy at L4    left side, (Dr. Towanda Malkin group).   . Migraine headache   . Ovarian cyst   . Pernicious anemia   . PONV (postoperative nausea and vomiting)   . Rheumatoid arthritis ([place])    was seeing Ou[facility] Sills but no longer  . Skin cancer    abdomen and neck- states not melanoma    Past Surgical History:  Procedure Laterality Date  . APPENDECTOMY  10/2008  . COLON RESECTION  04/02/2011   Procedure: COLON RESECTION LAPAROSCOPIC;  Surgeon: Pedro Earls, MD;  Location: [facility];  Service: General;  Laterality: N/A;  Laparscopic  Assisted Ileocecostomy   . FRACTURE SURGERY    . HAND SURGERY     left  . NASAL SINUS SURGERY    . SMALL INTESTINE SURGERY    . SPINE SURGERY  08/2005   L5 - S1 fusion  . WISDOM TOOTH EXTRACTION      Family  History  Problem Relation Age of Onset  . Colon cancer Paternal Grandfather   . Breast cancer Paternal Grandmother   . Colon polyps Paternal Grandmother   . Cancer Paternal Grandmother        breast  . Diabetes Maternal Grandmother   . Heart disease Maternal Grandmother   . Cirrhosis Maternal Grandfather        heavy drinker  . ADD / ADHD Brother     Current Outpatient Medications on File Prior to Visit  Medication Sig Dispense Refill  . amphetamine-dextroamphetamine (ADDERALL) 30 MG tablet Take 1 tablet by mouth 2 (two) times daily. 60 tablet 0  . cyanocobalamin (,VITAMIN B-12,) 1000 MCG/ML injection INJECT 1 ML INTO THE MUSCLE EVERY 30 DAYS. 3 mL 9  . hydrochlorothiazide (HYDRODIURIL) 25 MG tablet TAKE 1 TABLET BY MOUTH EVERY DAY AS NEEDED FOR SWELLING 90 tablet 2  . HYDROcodone-acetaminophen (NORCO/VICODIN) 5-325 MG per tablet Take 1 tablet by mouth every 6 (six) hours as needed for moderate pain. 30 tablet 0  . hydrOXYzine (ATARAX/VISTARIL) 25 MG tablet Take 1 tablet (25 mg total) by mouth every 8 (eight) hours as needed for anxiety. 12 tablet 0  . SYRINGE-NEEDLE, DISP, 3 ML 25G X 5/8" 3 ML MISC  1 each by Does not apply route every 30 (thirty) days. 100 each 11  . gabapentin (NEURONTIN) 100 MG capsule Take 1 capsule (100 mg total) by mouth 3 (three) times daily. (Patient not taking: Reported on 05/10/2019) 20 capsule 0  . lisinopril (PRINIVIL,ZESTRIL) 10 MG tablet TAKE 1 TABLET (10 MG TOTAL) BY MOUTH DAILY. (Patient not taking: Reported on 05/10/2019) 90 tablet 3  . methocarbamol (ROBAXIN) 500 MG tablet Take 1 tablet (500 mg total) by mouth 2 (two) times daily. (Patient not taking: Reported on 05/10/2019) 20 tablet 0   No current facility-administered medications on file prior to visit.    Allergies  Allergen Reactions  . Percocet [Oxycodone-Acetaminophen] Swelling  . Soy Allergy     Abdominal pains    Social History   Substance and Sexual Activity  Alcohol Use No   Comment:  rarely    Social History   Tobacco Use  Smoking Status Current Every Day Smoker  . Packs/day: 0.25  . Years: 2.00  . Pack years: 0.50  . Types: Cigarettes  Smokeless Tobacco Former Systems developer  . Quit date: 01/29/2011    Review of Systems  Constitutional: Negative.   HENT: Negative.   Eyes: Negative.   Respiratory: Negative.   Cardiovascular: Negative.   Gastrointestinal: Positive for abdominal pain.  Genitourinary: Negative.   Musculoskeletal: Positive for back pain, joint pain and neck pain.  Skin: Positive for itching.  Neurological: Negative.   Endo/Heme/Allergies: Negative.   Psychiatric/Behavioral: Negative.     Objective   Vitals:   05/10/19 1314  BP: 120/88  Pulse: 96  Resp: 16  Temp: 98 F (36.7 C)  SpO2: 98%    Physical Exam Vitals reviewed.  Constitutional:      Appearance: Normal appearance. She is not ill-appearing.  HENT:     Head: Normocephalic and atraumatic.  Cardiovascular:     Rate and Rhythm: Normal rate and regular rhythm.     Heart sounds: Normal heart sounds. No murmur. No friction rub. No gallop.   Pulmonary:     Effort: Pulmonary effort is normal. No respiratory distress.     Breath sounds: No stridor. No wheezing, rhonchi or rales.  Skin:    General: Skin is warm and dry.     Comments: Both axillas inspected and there is evidence of healed small incisions bilaterally.  No induration or open wounds are present.  No erythema is present.  Neurological:     General: No focal deficit present.     Mental Status: She is alert and oriented to person, place, and time.   Urgent care notes reviewed  Assessment  Hidradenitis suppurativa, resolved History of MRSA Plan   At this point, no further surgical intervention is warranted.  I did discuss with the patient that hidradenitis can be recurrent.  Literature was given and we went through instructions on how to avoid this.  She does have a history of MRSA and may be colonized.  I will treat her  with Bactroban ointment to the nares x5 days.  She was instructed to follow-up with me as needed.

## 2019-05-13 ENCOUNTER — Other Ambulatory Visit: Payer: Self-pay | Admitting: Family Medicine

## 2019-05-13 MED ORDER — AMPHETAMINE-DEXTROAMPHETAMINE 30 MG PO TABS: 30.0000 mg | ORAL_TABLET | Freq: Two times a day (BID) | ORAL | 0 refills | Status: DC

## 2019-05-13 NOTE — Telephone Encounter (Signed)
Requested Prescriptions   Pending Prescriptions Disp Refills  . amphetamine-dextroamphetamine (ADDERALL) 30 MG tablet 60 tablet 0    Sig: Take 1 tablet by mouth 2 (two) times daily.    Last OV 05/28/2018   Last written 04/12/2019

## 2019-06-14 ENCOUNTER — Other Ambulatory Visit: Payer: Self-pay | Admitting: Family Medicine

## 2019-06-14 MED ORDER — AMPHETAMINE-DEXTROAMPHETAMINE 30 MG PO TABS: 30.0000 mg | ORAL_TABLET | Freq: Two times a day (BID) | ORAL | 0 refills | Status: DC

## 2019-06-14 NOTE — Telephone Encounter (Signed)
Ok to refill??  Last office visit 05/28/2018.  Last refill 05/13/2019.

## 2019-07-10 ENCOUNTER — Other Ambulatory Visit: Payer: Self-pay | Admitting: Family Medicine

## 2019-07-11 MED ORDER — AMPHETAMINE-DEXTROAMPHETAMINE 30 MG PO TABS: 30.0000 mg | ORAL_TABLET | Freq: Two times a day (BID) | ORAL | 0 refills | Status: DC

## 2019-07-11 NOTE — Telephone Encounter (Signed)
Ok to refill??  Last office visit 05/28/2018.  Last refill 06/14/2019.  Of note, letter sent to patient to schedule appointment.

## 2019-07-13 ENCOUNTER — Encounter: Payer: Self-pay | Admitting: Family Medicine

## 2019-07-13 MED ORDER — AMPHETAMINE-DEXTROAMPHETAMINE 30 MG PO TABS: 30.0000 mg | ORAL_TABLET | Freq: Two times a day (BID) | ORAL | 0 refills | Status: DC

## 2019-07-13 NOTE — Telephone Encounter (Signed)
Medication was refilled by PCP to [facility] on 07/11/2019.  Ok to send to new pharmacy?

## 2019-08-05 ENCOUNTER — Ambulatory Visit: Payer: Self-pay | Admitting: Family Medicine

## 2019-08-08 ENCOUNTER — Encounter: Payer: Self-pay | Admitting: Family Medicine

## 2019-08-08 ENCOUNTER — Ambulatory Visit (INDEPENDENT_AMBULATORY_CARE_PROVIDER_SITE_OTHER): Payer: BC Managed Care – PPO | Admitting: Family Medicine

## 2019-08-08 ENCOUNTER — Other Ambulatory Visit: Payer: Self-pay

## 2019-08-08 VITALS — BP 140/80 | HR 96 | Temp 96.0°F | Resp 16 | Ht 66.0 in | Wt 163.0 lb

## 2019-08-08 DIAGNOSIS — F901 Attention-deficit hyperactivity disorder, predominantly hyperactive type: Secondary | ICD-10-CM

## 2019-08-08 DIAGNOSIS — M2550 Pain in unspecified joint: Secondary | ICD-10-CM | POA: Diagnosis not present

## 2019-08-08 DIAGNOSIS — I10 Essential (primary) hypertension: Secondary | ICD-10-CM | POA: Diagnosis not present

## 2019-08-08 NOTE — Progress Notes (Signed)
Subjective:    Patient ID: Norma Harmon, female    DOB: 01/18/1986, 34 y.o.   MRN: [id number]  Medication Refill    07/01/16 Patient is a very pleasant 33 year old white female here today to establish care. She is the daughter of Marlowe Kays who is one of my patients. Her father died suddenly when a building collapsed on him. However her father dealt with anxiety for years. Her brother has ADD as well as anxiety. The patient has a very complicated past medical history. She suffered a crush injury to her left arm when it was stuck in a conveyor belt. Although there is no visible deformity to the arm now she still has residual pain in the left arm. She also has a history of L5-S1 lumbar fusion due to chronic low back pain and degenerative disc disease. She reports 2 bulging disc in her thoracic spine. She sees a neurosurgeon who refills her hydrocodone for her back pain. She uses this extremely rarely. A 30 day supply usually will last for 6 months. She also has a history of ADHD for which she takes Adderall  twice a day. She was diagnosed with a condition as a child and has been on the medication for a long time. Her blood pressure today is extremely high. She states that she is extremely anxious although reviewing her past records also suggest that she has frequent visits with elevated blood pressure. She is not taking hydrochlorothiazide that her previous primary care provider had given her for swelling. She also has a history of a partial colectomy for colitis. Per the patient's report she believes that she was diagnosed with Crohn's disease although she is not following up regularly with the gastroenterologist. She sees a gynecologist who treats her for endometriosis with oral contraceptive pills.  At that time, ,my plan was: Patient appears very sad and depressed. I believe anxiety could be playing a large role in the patient's symptoms. However I'm extremely concerned about her blood pressure. I  recommended that she check her blood pressure several times a day for the next 3 days and report the values to me on Friday. If her blood pressure is persistently elevated and I confirmed myself today at this visit, I would recommend talking with her gynecologist about switching to a non-estrogen-containing birth control, and I will check a urinalysis as well as a renal artery ultrasound and a renal ultrasound to evaluate for signs of fibromuscular dysplasia or renal artery stenosis. I will check basic lab work including a CBC, CMP, fasting lipid panel. And I will treat the patient's blood pressure most likely with an angiotensin receptor blocker. After we have determined the course of therapy for her blood pressure, I would then to with the patient trying to better manage her anxiety. However we will need to focus on her blood pressure first.  07/09/17 Since I last saw the patient, she quit taking her oral contraceptive pills due to her elevated blood pressure.  Shortly thereafter, her blood pressure improved dramatically.  She has been checking her blood pressure regularly on her own and finding it running between 120 and 130/70-80.  She is compliant with lisinopril which is also helping her blood pressure.  However she is now no longer on contraception.  Unfortunately she has very heavy menstrual periods.  She has discussed her case with her gynecologist who has recommended an IUD but she is yet to pursue that.  She states that she can soak a tampon quickly during  her period and that can last for more than 7 days.  She also reports severe fatigue.  She is on vitamin B12 injections monthly due to a history of pernicious anemia after she had surgical resection of part of her intestines due to Crohn's disease.  She is overdue to check a vitamin B12 level.  She is still taking Adderall immediate release 30 mg twice a day.  She quit her previous job and is currently in a new job where she is much happier.  Her  anxiety level has improved.  She still feels like she needs the current dose of medication to help her focus however she would be willing to consider weaning down on the medication in the future.  At that time, my plan was: I am concerned about the patient's chronic fatigue.  I suspect is due to nutritional deficiencies given her history of Crohn's disease.  I recommended checking a vitamin B12 level, a CBC to evaluate for anemia, a TSH, and a CMP.  If the patient is anemic, I would recommend checking an iron level.  I encouraged the patient to pursue an IUD through her gynecologist to help control her vaginal bleeding.  While checking lab work, I will also check the patient's cholesterol.  Her blood pressure is much better on the medication and off her birth control.  I will make no changes in her blood pressure medication.  Continue Adderall 30 mg twice daily however I would like to eventually try to wean the patient down in the future once she has become established in her new job.  05/28/18 Patient is here today for follow-up.  She is still taking Adderall 10 mg twice daily.  She denies any palpitations.  She has tachycardia here today but that is due to anxiety.  She always gets anxious in the doctor's office.  She states that she checks her blood pressure and heart rate at home and is usually well controlled with her heart rate well below 100.  She denies any insomnia.  She denies any palpitations.  She has lost weight but this is been intentional as she is trying to lose weight to get off some of her blood pressure medication.  She is still taking hydrochlorothiazide and lisinopril.  Her blood pressure today is well controlled.  She states that she is seeing similar readings if not better at home.  She would like to stop lisinopril.  She denies any chest pain shortness of breath or dyspnea on exertion.  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  This requires a lot  of computer work and tedious reading.  She states that without the medication she is unable to focus and complete her assigned task.  She makes frequent mistakes and she is easily distracted without the medication.  Therefore she does not want to switch Adderall at the present time.  She also has a history of B12 deficiency due to resection of part of her intestines due to Crohn's disease.  Therefore we are due to recheck her B12 level.  She is self administering B12 shots at home.  She also has degenerative disc disease and occasionally uses gabapentin versus hydrocodone for low back pain with radiculopathy.  At that time, my plan was: Continue Adderall 30 mg p.o. twice daily as this is currently working.  I have recommended the patient continue hydrochlorothiazide but I like her to discontinue lisinopril and monitor her blood pressure closely  at home.  I will check a CBC, CMP, fasting lipid panel today.  I will monitor her the treatment of her pernicious anemia with a CBC as well as a vitamin B12 level.  Patient received her flu shot today.  08/08/19 Since I last saw the patient, she quit her job.  She is currently a Clinical biochemist.  Her work is going very well.  However she requires Adderall twice a day to help her focus.  Her blood pressure today is elevated at 140/80 however she states that when she checks it at home it is typically in the 120s over 80s.  She has whitecoat hypertension and she gets nervous in the doctor's office.  Unfortunately she continues to smoke.  She has reduced her smoking to half a pack of cigarettes per day.  She is also decreased her alcohol consumption and is now only drinking 1 or 2 alcoholic drinks per week.  She does have a history of rheumatoid arthritis.  She states that recently she has been developing pain in her hands, her elbows, her knees, her ankles, and both shoulders.  She attributed this to just the rigors of her physical labor as a Clinical biochemist.  There is  no obvious erythema or swelling in her joints today. Past Medical History:  Diagnosis Date  . ADHD (attention deficit hyperactivity disorder)    recently started on Adderol  . Allergic rhinitis   . Anxiety   . Arthritis    lumbar back  . Colitis   . Crushing injury of left hand   . Depression   . Diarrhea   . Endometriosis    Dr. Rogue Bussing GYN  . Family history of malignant neoplasm of gastrointestinal tract   . GERD (gastroesophageal reflux disease)   . IBS (irritable bowel syndrome)   . Lumbosacral radiculopathy at L4    left side, (Dr. Towanda Malkin group).   . Migraine headache   . Ovarian cyst   . Pernicious anemia   . PONV (postoperative nausea and vomiting)   . Rheumatoid arthritis ([place])    was seeing Ouida Sills but no longer  . Skin cancer    abdomen and neck- states not melanoma   Past Surgical History:  Procedure Laterality Date  . APPENDECTOMY  10/2008  . COLON RESECTION  04/02/2011   Procedure: COLON RESECTION LAPAROSCOPIC;  Surgeon: Pedro Earls, MD;  Location: [facility];  Service: General;  Laterality: N/A;  Laparscopic  Assisted Ileocecostomy   . FRACTURE SURGERY    . HAND SURGERY     left  . NASAL SINUS SURGERY    . SMALL INTESTINE SURGERY    . SPINE SURGERY  08/2005   L5 - S1 fusion  . WISDOM TOOTH EXTRACTION     Current Outpatient Medications on File Prior to Visit  Medication Sig Dispense Refill  . amphetamine-dextroamphetamine (ADDERALL) 30 MG tablet Take 1 tablet by mouth 2 (two) times daily. 60 tablet 0  . cyanocobalamin (,VITAMIN B-12,) 1000 MCG/ML injection INJECT 1 ML INTO THE MUSCLE EVERY 30 DAYS. 3 mL 9  . hydrochlorothiazide (HYDRODIURIL) 25 MG tablet TAKE 1 TABLET BY MOUTH EVERY DAY AS NEEDED FOR SWELLING 90 tablet 2  . HYDROcodone-acetaminophen (NORCO/VICODIN) 5-325 MG per tablet Take 1 tablet by mouth every 6 (six) hours as needed for moderate pain. 30 tablet 0  . hydrOXYzine (ATARAX/VISTARIL) 25 MG tablet Take 1 tablet (25 mg total) by mouth every  8 (eight) hours as needed for anxiety. 12 tablet 0  .  lisinopril (PRINIVIL,ZESTRIL) 10 MG tablet TAKE 1 TABLET (10 MG TOTAL) BY MOUTH DAILY. (Patient not taking: Reported on 05/10/2019) 90 tablet 3  . methocarbamol (ROBAXIN) 500 MG tablet Take 1 tablet (500 mg total) by mouth 2 (two) times daily. (Patient not taking: Reported on 05/10/2019) 20 tablet 0  . mupirocin ointment (BACTROBAN) 2 % APPLY A SMALL AOUNT INTO THE NOSE TWICE A DAY FOR 5. PRESS NOSE TOGETHER AND GENTLY MASSAGE AFTER 22 g 0  . SYRINGE-NEEDLE, DISP, 3 ML 25G X 5/8" 3 ML MISC 1 each by Does not apply route every 30 (thirty) days. 100 each 11   No current facility-administered medications on file prior to visit.   Allergies  Allergen Reactions  . Percocet [Oxycodone-Acetaminophen] Swelling  . Soy Allergy     Abdominal pains   Social History   Socioeconomic History  . Marital status: Divorced    Spouse name: Not on file  . Number of children: 0  . Years of education: Not on file  . Highest education level: Not on file  Occupational History  . Occupation: Nurse, adult: [employer]  Tobacco Use  . Smoking status: Current Every Day Smoker    Packs/day: 0.25    Years: 2.00    Pack years: 0.50    Types: Cigarettes  . Smokeless tobacco: Former Systems developer    Quit date: 01/29/2011  Substance and Sexual Activity  . Alcohol use: No    Comment: rarely  . Drug use: No  . Sexual activity: Yes    Birth control/protection: Other-see comments    Comment: nuvaring  Other Topics Concern  . Not on file  Social History Narrative  . Not on file   Social Determinants of Health   Financial Resource Strain:   . Difficulty of Paying Living Expenses:   Food Insecurity:   . Worried About Charity fundraiser in the Last Year:   . Arboriculturist in the Last Year:   Transportation Needs:   . Film/video editor (Medical):   Marland Kitchen Lack of Transportation (Non-Medical):   Physical Activity:   . Days of Exercise  per Week:   . Minutes of Exercise per Session:   Stress:   . Feeling of Stress :   Social Connections:   . Frequency of Communication with Friends and Family:   . Frequency of Social Gatherings with Friends and Family:   . Attends Religious Services:   . Active Member of Clubs or Organizations:   . Attends Archivist Meetings:   Marland Kitchen Marital Status:   Intimate Partner Violence:   . Fear of Current or Ex-Partner:   . Emotionally Abused:   Marland Kitchen Physically Abused:   . Sexually Abused:    Family History  Problem Relation Age of Onset  . Colon cancer Paternal Grandfather   . Breast cancer Paternal Grandmother   . Colon polyps Paternal Grandmother   . Cancer Paternal Grandmother        breast  . Diabetes Maternal Grandmother   . Heart disease Maternal Grandmother   . Cirrhosis Maternal Grandfather        heavy drinker  . ADD / ADHD Brother       Review of Systems  All other systems reviewed and are negative.      Objective:   Physical Exam Vitals reviewed.  Constitutional:      General: She is not in acute distress.    Appearance: She is well-developed. She  is not diaphoretic.  HENT:     Head: Normocephalic and atraumatic.     Right Ear: External ear normal.     Left Ear: External ear normal.     Nose: Nose normal.     Mouth/Throat:     Pharynx: No oropharyngeal exudate.  Eyes:     Conjunctiva/sclera: Conjunctivae normal.     Pupils: Pupils are equal, round, and reactive to light.  Neck:     Thyroid: No thyromegaly.     Vascular: No JVD.  Cardiovascular:     Rate and Rhythm: Normal rate and regular rhythm.     Heart sounds: Normal heart sounds. No murmur.  Pulmonary:     Effort: Pulmonary effort is normal. No respiratory distress.     Breath sounds: Normal breath sounds. No wheezing or rales.  Abdominal:     General: Bowel sounds are normal. There is no distension.     Palpations: Abdomen is soft.     Tenderness: There is no abdominal tenderness. There  is no guarding or rebound.  Musculoskeletal:     Cervical back: Neck supple.  Lymphadenopathy:     Cervical: No cervical adenopathy.  Skin:    Coloration: Skin is not pale.     Findings: No erythema or rash.  Neurological:     Mental Status: She is alert and oriented to person, place, and time.     Cranial Nerves: No cranial nerve deficit.     Coordination: Coordination normal.     Deep Tendon Reflexes: Reflexes are normal and symmetric.  Psychiatric:        Mood and Affect: Mood is not anxious or depressed.        Speech: Speech normal.        Behavior: Behavior normal.        Thought Content: Thought content normal.        Judgment: Judgment normal.            Assessment & Plan:  Benign essential HTN - Plan: CBC with Differential/Platelet, COMPLETE METABOLIC PANEL WITH GFR, Lipid panel  Attention-deficit hyperactivity disorder, predominantly hyperactive type  Polyarthralgia - Plan: Sedimentation rate, Rheumatoid factor  We will continue Adderall at his current dose twice daily.  I recommended trying to wean down as much as possible whenever the patient feels that she can.  Ideally I would like her on less this medication due to her history of hypertension.  Her blood pressure today is borderline.  She will check her blood pressure every day at home and notify me of values.  If consistently greater than 140/90, I would recommend having atenolol.  Given her polyarthralgias, I will check a sedimentation rate and rheumatoid factor.  If elevated, I would recommend rheumatology consultation for disease modifying antirheumatologic drugs to prevent long-term damage to the joints.  I will check a fasting lipid panel.  Goal LDL cholesterol will be less than 130.  Strongly strongly strongly encourage smoking cessation.

## 2019-08-12 ENCOUNTER — Other Ambulatory Visit: Payer: Self-pay | Admitting: Family Medicine

## 2019-08-12 NOTE — Telephone Encounter (Signed)
Ok to refill??  Last office visit 08/08/2019.  Last refill 07/13/2019.

## 2019-08-15 MED ORDER — AMPHETAMINE-DEXTROAMPHETAMINE 30 MG PO TABS: 30.0000 mg | ORAL_TABLET | Freq: Two times a day (BID) | ORAL | 0 refills | Status: DC

## 2019-08-30 ENCOUNTER — Other Ambulatory Visit: Payer: Self-pay

## 2019-08-30 ENCOUNTER — Ambulatory Visit (INDEPENDENT_AMBULATORY_CARE_PROVIDER_SITE_OTHER): Payer: BC Managed Care – PPO | Admitting: Family Medicine

## 2019-08-30 ENCOUNTER — Encounter: Payer: Self-pay | Admitting: Family Medicine

## 2019-08-30 VITALS — BP 164/100 | HR 102 | Temp 97.4°F | Resp 14 | Ht 66.0 in | Wt 163.0 lb

## 2019-08-30 DIAGNOSIS — L03213 Periorbital cellulitis: Secondary | ICD-10-CM | POA: Diagnosis not present

## 2019-08-30 MED ORDER — FLUCONAZOLE 150 MG PO TABS: 150.0000 mg | ORAL_TABLET | Freq: Once | ORAL | 0 refills | Status: AC

## 2019-08-30 MED ORDER — SULFAMETHOXAZOLE-TRIMETHOPRIM 800-160 MG PO TABS: 1.0000 | ORAL_TABLET | Freq: Two times a day (BID) | ORAL | 0 refills | Status: DC

## 2019-08-30 MED ORDER — CEFTRIAXONE SODIUM 1 G IJ SOLR
1.0000 g | Freq: Once | INTRAMUSCULAR | Status: AC
  Administered 2019-08-30: 1 g via INTRAMUSCULAR

## 2019-08-30 NOTE — Addendum Note (Signed)
Addended by: Shary Decamp B on: 08/30/2019 10:05 AM   Modules accepted: Orders

## 2019-08-30 NOTE — Progress Notes (Signed)
Subjective:    Patient ID: Norma Harmon, female    DOB: 07-29-1985, 34 y.o.   MRN: [id number]      Patient presents with a 2-day history of pain and swelling developing around the left eye.  It began with a pimple just above the left eye adjacent to the eyebrow.  She drained this with a needle.  Now erythema is extending up onto the forehead onto the nasal bridge and down onto the left cheek.  She also has swelling in the left upper eyelid.  She has no pain with extraocular movement.  She has no blurry vision.  She has no headache.  She does have a history of MRSA and has had incision and drainage of abscesses in her armpits in the past. Past Medical History:  Diagnosis Date  . ADHD (attention deficit hyperactivity disorder)    recently started on Adderol  . Allergic rhinitis   . Anxiety   . Arthritis    lumbar back  . Colitis   . Crushing injury of left hand   . Depression   . Diarrhea   . Endometriosis    Dr. Rogue Bussing GYN  . Family history of malignant neoplasm of gastrointestinal tract   . GERD (gastroesophageal reflux disease)   . IBS (irritable bowel syndrome)   . Lumbosacral radiculopathy at L4    left side, (Dr. Towanda Malkin group).   . Migraine headache   . Ovarian cyst   . Pernicious anemia   . PONV (postoperative nausea and vomiting)   . Rheumatoid arthritis ([place])    was seeing Ouida Sills but no longer  . Skin cancer    abdomen and neck- states not melanoma   Past Surgical History:  Procedure Laterality Date  . APPENDECTOMY  10/2008  . COLON RESECTION  04/02/2011   Procedure: COLON RESECTION LAPAROSCOPIC;  Surgeon: Pedro Earls, MD;  Location: [facility];  Service: General;  Laterality: N/A;  Laparscopic  Assisted Ileocecostomy   . FRACTURE SURGERY    . HAND SURGERY     left  . NASAL SINUS SURGERY    . SMALL INTESTINE SURGERY    . SPINE SURGERY  08/2005   L5 - S1 fusion  . WISDOM TOOTH EXTRACTION     Current Outpatient Medications on File Prior to Visit    Medication Sig Dispense Refill  . amphetamine-dextroamphetamine (ADDERALL) 30 MG tablet Take 1 tablet by mouth 2 (two) times daily. 60 tablet 0  . cyanocobalamin (,VITAMIN B-12,) 1000 MCG/ML injection INJECT 1 ML INTO THE MUSCLE EVERY 30 DAYS. 3 mL 9  . diclofenac Sodium (VOLTAREN) 1 % GEL Apply 4 g topically 4 (four) times daily.    . hydrochlorothiazide (HYDRODIURIL) 25 MG tablet TAKE 1 TABLET BY MOUTH EVERY DAY AS NEEDED FOR SWELLING 90 tablet 2  . Levonorgestrel (SKYLA) 13.5 MG IUD Skyla 14 mcg/24 hrs (3 yrs) 13.5 mg intrauterine device  Take 1 device by intrauterine route.     No current facility-administered medications on file prior to visit.   Allergies  Allergen Reactions  . Percocet [Oxycodone-Acetaminophen] Swelling  . Soy Allergy     Abdominal pains   Social History   Socioeconomic History  . Marital status: Divorced    Spouse name: Not on file  . Number of children: 0  . Years of education: Not on file  . Highest education level: Not on file  Occupational History  . Occupation: Nurse, adult: [employer]  Tobacco Use  .  Smoking status: Current Every Day Smoker    Packs/day: 0.25    Years: 2.00    Pack years: 0.50    Types: Cigarettes  . Smokeless tobacco: Former Systems developer    Quit date: 01/29/2011  Substance and Sexual Activity  . Alcohol use: No    Comment: rarely  . Drug use: No  . Sexual activity: Yes    Birth control/protection: Other-see comments    Comment: nuvaring  Other Topics Concern  . Not on file  Social History Narrative  . Not on file   Social Determinants of Health   Financial Resource Strain:   . Difficulty of Paying Living Expenses:   Food Insecurity:   . Worried About Charity fundraiser in the Last Year:   . Arboriculturist in the Last Year:   Transportation Needs:   . Film/video editor (Medical):   Marland Kitchen Lack of Transportation (Non-Medical):   Physical Activity:   . Days of Exercise per Week:   . Minutes  of Exercise per Session:   Stress:   . Feeling of Stress :   Social Connections:   . Frequency of Communication with Friends and Family:   . Frequency of Social Gatherings with Friends and Family:   . Attends Religious Services:   . Active Member of Clubs or Organizations:   . Attends Archivist Meetings:   Marland Kitchen Marital Status:   Intimate Partner Violence:   . Fear of Current or Ex-Partner:   . Emotionally Abused:   Marland Kitchen Physically Abused:   . Sexually Abused:    Family History  Problem Relation Age of Onset  . Colon cancer Paternal Grandfather   . Breast cancer Paternal Grandmother   . Colon polyps Paternal Grandmother   . Cancer Paternal Grandmother        breast  . Diabetes Maternal Grandmother   . Heart disease Maternal Grandmother   . Cirrhosis Maternal Grandfather        heavy drinker  . ADD / ADHD Brother       Review of Systems  All other systems reviewed and are negative.      Objective:   Physical Exam Vitals reviewed.  Constitutional:      General: She is not in acute distress.    Appearance: She is well-developed. She is not diaphoretic.  HENT:     Head: Normocephalic and atraumatic.     Right Ear: External ear normal.     Left Ear: External ear normal.     Nose: Nose normal.     Mouth/Throat:     Pharynx: No oropharyngeal exudate.  Eyes:     Conjunctiva/sclera: Conjunctivae normal.     Pupils: Pupils are equal, round, and reactive to light.   Neck:     Thyroid: No thyromegaly.     Vascular: No JVD.  Cardiovascular:     Rate and Rhythm: Normal rate and regular rhythm.     Heart sounds: Normal heart sounds. No murmur.  Pulmonary:     Effort: Pulmonary effort is normal. No respiratory distress.     Breath sounds: Normal breath sounds. No wheezing or rales.  Musculoskeletal:     Cervical back: Neck supple.  Lymphadenopathy:     Cervical: No cervical adenopathy.  Skin:    Findings: Erythema present.  Neurological:     Mental Status:  She is alert and oriented to person, place, and time.     Cranial Nerves: No cranial nerve deficit.  Coordination: Coordination normal.     Deep Tendon Reflexes: Reflexes are normal and symmetric.  Psychiatric:        Mood and Affect: Mood is not anxious or depressed.        Speech: Speech normal.        Behavior: Behavior normal.            Assessment & Plan:  Preseptal cellulitis of left eye  Rocephin 1 g IM x1 now.  Begin Bactrim double strength tablets twice daily for 1 week to cover for possible MRSA.  Reassess in 48 hours or immediately if worsening.

## 2019-09-08 ENCOUNTER — Other Ambulatory Visit: Payer: BC Managed Care – PPO

## 2019-09-09 LAB — COMPLETE METABOLIC PANEL WITH GFR
AG Ratio: 1.6 (calc) (ref 1.0–2.5)
ALT: 20 U/L (ref 6–29)
AST: 20 U/L (ref 10–30)
Albumin: 4.3 g/dL (ref 3.6–5.1)
Alkaline phosphatase (APISO): 59 U/L (ref 31–125)
BUN/Creatinine Ratio: 12 (calc) (ref 6–22)
BUN: 14 mg/dL (ref 7–25)
CO2: 25 mmol/L (ref 20–32)
Calcium: 9.8 mg/dL (ref 8.6–10.2)
Chloride: 104 mmol/L (ref 98–110)
Creat: 1.18 mg/dL — ABNORMAL HIGH (ref 0.50–1.10)
GFR, Est African American: 71 mL/min/{1.73_m2} (ref >=60)
GFR, Est Non African American: 61 mL/min/{1.73_m2} (ref >=60)
Globulin: 2.7 g/dL (calc) (ref 1.9–3.7)
Glucose, Bld: 93 mg/dL (ref 65–99)
Potassium: 4.7 mmol/L (ref 3.5–5.3)
Sodium: 139 mmol/L (ref 135–146)
Total Bilirubin: 0.3 mg/dL (ref 0.2–1.2)
Total Protein: 7 g/dL (ref 6.1–8.1)

## 2019-09-09 LAB — CBC WITH DIFFERENTIAL/PLATELET
Absolute Monocytes: 383 cells/uL (ref 200–950)
Basophils Absolute: 52 cells/uL (ref 0–200)
Basophils Relative: 1.2 %
Eosinophils Absolute: 90 cells/uL (ref 15–500)
Eosinophils Relative: 2.1 %
HCT: 40 % (ref 35.0–45.0)
Hemoglobin: 13.5 g/dL (ref 11.7–15.5)
Lymphs Abs: 1445 cells/uL (ref 850–3900)
MCH: 29.4 pg (ref 27.0–33.0)
MCHC: 33.8 g/dL (ref 32.0–36.0)
MCV: 87.1 fL (ref 80.0–100.0)
MPV: 9.7 fL (ref 7.5–12.5)
Monocytes Relative: 8.9 %
Neutro Abs: 2331 cells/uL (ref 1500–7800)
Neutrophils Relative %: 54.2 %
Platelets: 302 10*3/uL (ref 140–400)
RBC: 4.59 10*6/uL (ref 3.80–5.10)
RDW: 11.9 % (ref 11.0–15.0)
Total Lymphocyte: 33.6 %
WBC: 4.3 10*3/uL (ref 3.8–10.8)

## 2019-09-09 LAB — RHEUMATOID FACTOR: Rhuematoid fact SerPl-aCnc: 14 IU/mL — ABNORMAL HIGH (ref <14)

## 2019-09-09 LAB — LIPID PANEL
Cholesterol: 185 mg/dL (ref <200)
HDL: 85 mg/dL (ref >=50)
LDL Cholesterol (Calc): 87 mg/dL (calc)
Non-HDL Cholesterol (Calc): 100 mg/dL (calc) (ref <130)
Total CHOL/HDL Ratio: 2.2 (calc) (ref <5.0)
Triglycerides: 51 mg/dL (ref <150)

## 2019-09-09 LAB — SEDIMENTATION RATE: Sed Rate: 2 mm/h (ref 0–20)

## 2019-09-27 ENCOUNTER — Other Ambulatory Visit: Payer: Self-pay | Admitting: Family Medicine

## 2019-09-28 NOTE — Telephone Encounter (Signed)
Ok to refill??  Last office visit 08/30/2019  Last refill 08/15/2019.

## 2019-09-29 MED ORDER — AMPHETAMINE-DEXTROAMPHETAMINE 30 MG PO TABS: 30.0000 mg | ORAL_TABLET | Freq: Two times a day (BID) | ORAL | 0 refills | Status: DC

## 2019-10-24 ENCOUNTER — Other Ambulatory Visit: Payer: Self-pay | Admitting: Family Medicine

## 2019-10-24 MED ORDER — AMPHETAMINE-DEXTROAMPHETAMINE 30 MG PO TABS: 30.0000 mg | ORAL_TABLET | Freq: Two times a day (BID) | ORAL | 0 refills | Status: DC

## 2019-10-24 NOTE — Telephone Encounter (Signed)
Last refill 09/29/19

## 2019-11-29 ENCOUNTER — Other Ambulatory Visit: Payer: Self-pay | Admitting: Family Medicine

## 2019-11-29 MED ORDER — AMPHETAMINE-DEXTROAMPHETAMINE 30 MG PO TABS: 30.0000 mg | ORAL_TABLET | Freq: Two times a day (BID) | ORAL | 0 refills | Status: DC

## 2019-11-29 NOTE — Telephone Encounter (Signed)
Ok to refill??  Last office visit 09/09/2019.  Last refill 10/24/2019.

## 2019-12-29 ENCOUNTER — Other Ambulatory Visit: Payer: Self-pay | Admitting: Family Medicine

## 2020-01-02 ENCOUNTER — Other Ambulatory Visit: Payer: Self-pay | Admitting: Family Medicine

## 2020-01-03 MED ORDER — AMPHETAMINE-DEXTROAMPHETAMINE 30 MG PO TABS: 30.0000 mg | ORAL_TABLET | Freq: Two times a day (BID) | ORAL | 0 refills | Status: DC

## 2020-01-03 NOTE — Telephone Encounter (Signed)
Ok to refill??  Last office visit 08/30/2019.  Last refill 11/29/2019.

## 2020-01-30 ENCOUNTER — Other Ambulatory Visit: Payer: Self-pay | Admitting: Family Medicine

## 2020-01-30 MED ORDER — AMPHETAMINE-DEXTROAMPHETAMINE 30 MG PO TABS: 30.0000 mg | ORAL_TABLET | Freq: Two times a day (BID) | ORAL | 0 refills | Status: DC

## 2020-01-30 NOTE — Telephone Encounter (Signed)
Ok to refill??  Last office visit 08/30/2019.  Last refill 01/03/2020.

## 2020-02-25 ENCOUNTER — Other Ambulatory Visit: Payer: Self-pay | Admitting: Family Medicine

## 2020-02-27 MED ORDER — AMPHETAMINE-DEXTROAMPHETAMINE 30 MG PO TABS: 30.0000 mg | ORAL_TABLET | Freq: Two times a day (BID) | ORAL | 0 refills | Status: DC

## 2020-02-27 NOTE — Telephone Encounter (Signed)
Ok to refill??  Last office visit 08/30/2019.  Last refill 01/30/2020.

## 2020-03-20 ENCOUNTER — Other Ambulatory Visit: Payer: Self-pay | Admitting: Family Medicine

## 2020-03-21 MED ORDER — AMPHETAMINE-DEXTROAMPHETAMINE 30 MG PO TABS: 30.0000 mg | ORAL_TABLET | Freq: Two times a day (BID) | ORAL | 0 refills | Status: DC

## 2020-03-21 NOTE — Telephone Encounter (Signed)
Please Advise

## 2020-04-16 ENCOUNTER — Other Ambulatory Visit: Payer: Self-pay | Admitting: Family Medicine

## 2020-04-16 MED ORDER — AMPHETAMINE-DEXTROAMPHETAMINE 30 MG PO TABS: 30.0000 mg | ORAL_TABLET | Freq: Two times a day (BID) | ORAL | 0 refills | Status: DC

## 2020-05-21 ENCOUNTER — Other Ambulatory Visit: Payer: Self-pay | Admitting: Family Medicine

## 2020-05-21 MED ORDER — AMPHETAMINE-DEXTROAMPHETAMINE 30 MG PO TABS: 30.0000 mg | ORAL_TABLET | Freq: Two times a day (BID) | ORAL | 0 refills | Status: DC

## 2020-05-21 NOTE — Telephone Encounter (Signed)
Ok to refill??  Last office visit 08/30/2019.  Last refill 04/16/2020.

## 2020-05-31 DIAGNOSIS — N390 Urinary tract infection, site not specified: Secondary | ICD-10-CM | POA: Diagnosis not present

## 2020-06-12 DIAGNOSIS — Z1389 Encounter for screening for other disorder: Secondary | ICD-10-CM | POA: Diagnosis not present

## 2020-06-18 ENCOUNTER — Other Ambulatory Visit: Payer: Self-pay | Admitting: Family Medicine

## 2020-06-18 MED ORDER — AMPHETAMINE-DEXTROAMPHETAMINE 30 MG PO TABS: 30.0000 mg | ORAL_TABLET | Freq: Two times a day (BID) | ORAL | 0 refills | Status: DC

## 2020-07-11 ENCOUNTER — Other Ambulatory Visit: Payer: Self-pay | Admitting: Family Medicine

## 2020-07-11 MED ORDER — AMPHETAMINE-DEXTROAMPHETAMINE 30 MG PO TABS: 30.0000 mg | ORAL_TABLET | Freq: Two times a day (BID) | ORAL | 0 refills | Status: DC

## 2020-08-03 ENCOUNTER — Other Ambulatory Visit: Payer: Self-pay | Admitting: Family Medicine

## 2020-08-03 MED ORDER — AMPHETAMINE-DEXTROAMPHETAMINE 30 MG PO TABS: 30.0000 mg | ORAL_TABLET | Freq: Two times a day (BID) | ORAL | 0 refills | Status: DC

## 2020-08-03 NOTE — Telephone Encounter (Signed)
Needs ov

## 2020-09-06 ENCOUNTER — Other Ambulatory Visit: Payer: Self-pay | Admitting: Family Medicine

## 2020-09-06 NOTE — Telephone Encounter (Signed)
Ok to refill??  Last office visit 08/30/2019.  Last refill 08/03/2020.  Of note, letter was sent to patient on last refill. No appointment scheduled at this time.

## 2020-09-07 MED ORDER — AMPHETAMINE-DEXTROAMPHETAMINE 30 MG PO TABS: 30.0000 mg | ORAL_TABLET | Freq: Two times a day (BID) | ORAL | 0 refills | Status: DC

## 2020-10-05 ENCOUNTER — Other Ambulatory Visit: Payer: Self-pay | Admitting: Family Medicine

## 2020-10-05 NOTE — Telephone Encounter (Signed)
Ok to refill??  Last office visit 08/30/2019.  Last refill 09/07/2020.  Of note, letter sent to schedule F/U.

## 2020-10-10 ENCOUNTER — Other Ambulatory Visit: Payer: Self-pay | Admitting: Family Medicine

## 2020-10-10 NOTE — Telephone Encounter (Signed)
Duplicate request

## 2020-10-10 NOTE — Telephone Encounter (Signed)
Ok to refill??  Last office visit 08/30/2019.  Last refill 09/07/2020.  Of note, letter has been sent to patient x2 to schedule F/U.

## 2020-10-10 NOTE — Telephone Encounter (Signed)
Patient scheduled CPE for 10/2020

## 2020-10-11 MED ORDER — AMPHETAMINE-DEXTROAMPHETAMINE 30 MG PO TABS: 30.0000 mg | ORAL_TABLET | Freq: Two times a day (BID) | ORAL | 0 refills | Status: DC

## 2020-10-16 ENCOUNTER — Ambulatory Visit: Payer: Self-pay | Admitting: Family Medicine

## 2020-11-05 ENCOUNTER — Other Ambulatory Visit: Payer: Self-pay

## 2020-11-05 DIAGNOSIS — I10 Essential (primary) hypertension: Secondary | ICD-10-CM

## 2020-11-06 LAB — LIPID PANEL
Cholesterol: 202 mg/dL — ABNORMAL HIGH (ref <200)
HDL: 74 mg/dL (ref >=50)
LDL Cholesterol (Calc): 109 mg/dL (calc) — ABNORMAL HIGH
Non-HDL Cholesterol (Calc): 128 mg/dL (calc) (ref <130)
Total CHOL/HDL Ratio: 2.7 (calc) (ref <5.0)
Triglycerides: 94 mg/dL (ref <150)

## 2020-11-06 LAB — CBC WITH DIFFERENTIAL/PLATELET
Absolute Monocytes: 463 cells/uL (ref 200–950)
Basophils Absolute: 31 cells/uL (ref 0–200)
Basophils Relative: 0.6 %
Eosinophils Absolute: 182 cells/uL (ref 15–500)
Eosinophils Relative: 3.5 %
HCT: 41.3 % (ref 35.0–45.0)
Hemoglobin: 13.8 g/dL (ref 11.7–15.5)
Lymphs Abs: 1654 cells/uL (ref 850–3900)
MCH: 29.5 pg (ref 27.0–33.0)
MCHC: 33.4 g/dL (ref 32.0–36.0)
MCV: 88.2 fL (ref 80.0–100.0)
MPV: 10.3 fL (ref 7.5–12.5)
Monocytes Relative: 8.9 %
Neutro Abs: 2870 cells/uL (ref 1500–7800)
Neutrophils Relative %: 55.2 %
Platelets: 241 10*3/uL (ref 140–400)
RBC: 4.68 10*6/uL (ref 3.80–5.10)
RDW: 12 % (ref 11.0–15.0)
Total Lymphocyte: 31.8 %
WBC: 5.2 10*3/uL (ref 3.8–10.8)

## 2020-11-06 LAB — COMPLETE METABOLIC PANEL WITH GFR
AG Ratio: 1.8 (calc) (ref 1.0–2.5)
ALT: 16 U/L (ref 6–29)
AST: 17 U/L (ref 10–30)
Albumin: 4.1 g/dL (ref 3.6–5.1)
Alkaline phosphatase (APISO): 50 U/L (ref 31–125)
BUN: 12 mg/dL (ref 7–25)
CO2: 28 mmol/L (ref 20–32)
Calcium: 9.5 mg/dL (ref 8.6–10.2)
Chloride: 104 mmol/L (ref 98–110)
Creat: 0.97 mg/dL (ref 0.50–0.97)
Globulin: 2.3 g/dL (calc) (ref 1.9–3.7)
Glucose, Bld: 92 mg/dL (ref 65–99)
Potassium: 4.4 mmol/L (ref 3.5–5.3)
Sodium: 137 mmol/L (ref 135–146)
Total Bilirubin: 0.5 mg/dL (ref 0.2–1.2)
Total Protein: 6.4 g/dL (ref 6.1–8.1)
eGFR: 79 mL/min/{1.73_m2} (ref >=60)

## 2020-11-06 LAB — TSH: TSH: 1.8 mIU/L

## 2020-11-12 ENCOUNTER — Other Ambulatory Visit: Payer: Self-pay

## 2020-11-12 ENCOUNTER — Encounter: Payer: Self-pay | Admitting: *Deleted

## 2020-11-12 ENCOUNTER — Encounter: Payer: Self-pay | Admitting: Family Medicine

## 2020-11-12 ENCOUNTER — Ambulatory Visit (INDEPENDENT_AMBULATORY_CARE_PROVIDER_SITE_OTHER): Payer: No Typology Code available for payment source | Admitting: Family Medicine

## 2020-11-12 VITALS — BP 134/68 | HR 102 | Temp 98.3°F | Resp 16 | Ht 65.75 in | Wt 168.0 lb

## 2020-11-12 DIAGNOSIS — Z0000 Encounter for general adult medical examination without abnormal findings: Secondary | ICD-10-CM

## 2020-11-12 DIAGNOSIS — I10 Essential (primary) hypertension: Secondary | ICD-10-CM

## 2020-11-12 DIAGNOSIS — F901 Attention-deficit hyperactivity disorder, predominantly hyperactive type: Secondary | ICD-10-CM | POA: Diagnosis not present

## 2020-11-12 DIAGNOSIS — Z0001 Encounter for general adult medical examination with abnormal findings: Secondary | ICD-10-CM

## 2020-11-12 MED ORDER — AMPHETAMINE-DEXTROAMPHETAMINE 30 MG PO TABS: 30.0000 mg | ORAL_TABLET | Freq: Two times a day (BID) | ORAL | 0 refills | Status: DC

## 2020-11-12 MED ORDER — CYANOCOBALAMIN 1000 MCG/ML IJ SOLN: 100.0000 ug | INTRAMUSCULAR | 9 refills | Status: DC

## 2020-11-12 MED ORDER — DICLOFENAC SODIUM 1 % EX GEL: 4.0000 g | Freq: Four times a day (QID) | CUTANEOUS | 3 refills | Status: AC

## 2020-11-12 MED ORDER — "BD ECLIPSE SYRINGE 25G X 1"" 3 ML MISC": 0 refills | Status: AC

## 2020-11-12 NOTE — Progress Notes (Signed)
Subjective:    Patient ID: Norma Harmon, female    DOB: 07-Sep-1985, 35 y.o.   MRN: [id number]  HPI Patient is a very pleasant 35 year old Caucasian female here today for complete physical exam.  She declines the COVID-vaccine.  She sees a gynecologist for her Pap smear.  She is due again for a Pap smear in 2023.  Otherwise she is doing well with no concerns.  She denies any issues with her Adderall.  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  She states without the medication she easily loses focus and finds her self easily distracted.  She has a difficult time prioritizing responsibilities and completing tasks in a timely manner.  With the medication, she is able to maintain focus in order to complete her professional responsibilities in a timely fashion without becoming easily distracted or switching to another project. Past medical history is complex:  She suffered a crush injury to her left arm when it was stuck in a conveyor belt. Although there is no visible deformity to the arm now she still has residual pain in the left arm. She also has a history of L5-S1 lumbar fusion due to chronic low back pain and degenerative disc disease. She was diagnosed with a condition as a child and has been on the medication for a long time. She also has a history of a partial colectomy for Crohn's colitis. Past Medical History:  Diagnosis Date   ADHD (attention deficit hyperactivity disorder)    recently started on Adderol   Allergic rhinitis    Anxiety    Arthritis    lumbar back   Colitis    Crushing injury of left hand    Depression    Diarrhea    Endometriosis    Dr. Rogue Bussing G[facility]   Family history of malignant neoplasm of gastrointestinal tract    GERD (gastroesophageal reflux disease)    IBS (irritable bowel syndrome)    Lumbosacral radiculopathy at L4    left side, (Dr. Towanda Malkin group).    Migraine headache    Ovarian cyst    Pernicious anemia    PONV  (postoperative nausea and vomiting)    Rheumatoid arthritis ([place])    was seeing Ouida Sills but no longer   Skin cancer    abdomen and neck- states not melanoma   Past Surgical History:  Procedure Laterality Date   APPENDECTOMY  10/2008   COLON RESECTION  04/02/2011   Procedure: COLON RESECTION LAPAROSCOPIC;  Surgeon: Pedro Earls, MD;  Location: [facility];  Service: General;  Laterality: N/A;  Laparscopic  Assisted Ileocecostomy    FRACTURE SURGERY     HAND SURGERY     left   NASAL SINUS SURGERY     SMALL INTESTINE SURGERY     SPINE SURGERY  08/2005   L5 - S1 fusion   WISDOM TOOTH EXTRACTION     Current Outpatient Medications on File Prior to Visit  Medication Sig Dispense Refill   amphetamine-dextroamphetamine (ADDERALL) 30 MG tablet Take 1 tablet by mouth 2 (two) times daily. 60 tablet 0   cyanocobalamin (,VITAMIN B-12,) 1000 MCG/ML injection INJECT 1 ML INTO THE MUSCLE EVERY 30 DAYS. 3 mL 9   diclofenac Sodium (VOLTAREN) 1 % GEL Apply 4 g topically 4 (four) times daily.     hydrochlorothiazide (HYDRODIURIL) 25 MG tablet TAKE 1 TABLET BY MOUTH EVERY DAY AS NEEDED FOR SWELLING 90 tablet 2   Levonorgestrel (SKYLA) 13.5 MG  IUD Skyla 14 mcg/24 hrs (3 yrs) 13.5 mg intrauterine device  Take 1 device by intrauterine route.     No current facility-administered medications on file prior to visit.   Allergies  Allergen Reactions   Percocet [Oxycodone-Acetaminophen] Swelling   Soy Allergy     Abdominal pains   Social History   Socioeconomic History   Marital status: Divorced    Spouse name: Not on file   Number of children: 0   Years of education: Not on file   Highest education level: Not on file  Occupational History   Occupation: Corporate investment banker    Employer: [employer]  Tobacco Use   Smoking status: Every Day    Packs/day: 0.25    Years: 2.00    Pack years: 0.50    Types: Cigarettes   Smokeless tobacco: Former    Quit date: 01/29/2011  Substance and Sexual  Activity   Alcohol use: No    Comment: rarely   Drug use: No   Sexual activity: Yes    Birth control/protection: Other-see comments    Comment: nuvaring  Other Topics Concern   Not on file  Social History Narrative   Not on file   Social Determinants of Health   Financial Resource Strain: Not on file  Food Insecurity: Not on file  Transportation Needs: Not on file  Physical Activity: Not on file  Stress: Not on file  Social Connections: Not on file  Intimate Partner Violence: Not on file   Family History  Problem Relation Age of Onset   Colon cancer Paternal Grandfather    Breast cancer Paternal Grandmother    Colon polyps Paternal Grandmother    Cancer Paternal Grandmother        breast   Diabetes Maternal Grandmother    Heart disease Maternal Grandmother    Cirrhosis Maternal Grandfather        heavy drinker   ADD / ADHD Brother       Review of Systems  All other systems reviewed and are negative.     Objective:   Physical Exam Vitals reviewed.  Constitutional:      General: She is not in acute distress.    Appearance: She is well-developed. She is not diaphoretic.  HENT:     Head: Normocephalic and atraumatic.     Right Ear: External ear normal.     Left Ear: External ear normal.     Nose: Nose normal.     Mouth/Throat:     Pharynx: No oropharyngeal exudate.  Eyes:     Conjunctiva/sclera: Conjunctivae normal.     Pupils: Pupils are equal, round, and reactive to light.  Neck:     Thyroid: No thyromegaly.     Vascular: No JVD.  Cardiovascular:     Rate and Rhythm: Normal rate and regular rhythm.     Heart sounds: Normal heart sounds. No murmur heard. Pulmonary:     Effort: Pulmonary effort is normal. No respiratory distress.     Breath sounds: Normal breath sounds. No wheezing or rales.  Abdominal:     General: Bowel sounds are normal. There is no distension.     Palpations: Abdomen is soft.     Tenderness: There is no abdominal tenderness. There  is no guarding or rebound.  Musculoskeletal:     Cervical back: Neck supple.  Lymphadenopathy:     Cervical: No cervical adenopathy.  Skin:    Coloration: Skin is not pale.     Findings: No  erythema or rash.  Neurological:     Mental Status: She is alert and oriented to person, place, and time.     Cranial Nerves: No cranial nerve deficit.     Coordination: Coordination normal.     Deep Tendon Reflexes: Reflexes are normal and symmetric. Reflexes normal.  Psychiatric:        Mood and Affect: Mood normal.        Speech: Speech normal.        Behavior: Behavior normal.        Thought Content: Thought content normal.        Judgment: Judgment normal.         Assessment & Plan:  General medical exam  Benign essential HTN  Attention-deficit hyperactivity disorder, predominantly hyperactive type Lab on 11/05/2020  Component Date Value Ref Range Status   Cholesterol 11/05/2020 202 (A) <200 mg/dL Final   HDL 11/05/2020 74  > OR = 50 mg/dL Final   Triglycerides 11/05/2020 94  <150 mg/dL Final   LDL Cholesterol (Calc) 11/05/2020 109 (A) mg/dL (calc) Final   Comment: Reference range: <100 . Desirable range <100 mg/dL for primary prevention;   <70 mg/dL for patients with CHD or diabetic patients  with > or = 2 CHD risk factors. Marland Kitchen LDL-C is now calculated using the Martin-Hopkins  calculation, which is a validated novel method providing  better accuracy than the Friedewald equation in the  estimation of LDL-C.  Cresenciano Genre et al. Annamaria Helling. 1324;401(02): 2061-2068  (http://education.[facility].com/faq/FAQ164)    Total CHOL/HDL Ratio 11/05/2020 2.7  <5.0 (calc) Final   Non-HDL Cholesterol (Calc) 11/05/2020 128  <130 mg/dL (calc) Final   Comment: For patients with diabetes plus 1 major ASCVD risk  factor, treating to a non-HDL-C goal of <100 mg/dL  (LDL-C of <70 mg/dL) is considered a therapeutic  option.    Glucose, Bld 11/05/2020 92  65 - 99 mg/dL Final   Comment: .             Fasting reference interval .    BUN 11/05/2020 12  7 - 25 mg/dL Final   Creat 11/05/2020 0.97  0.50 - 0.97 mg/dL Final   eGFR 11/05/2020 79  > OR = 60 mL/min/1.40m Final   Comment: The eGFR is based on the CKD-EPI 2021 equation. To calculate  the new eGFR from a previous Creatinine or Cystatin C result, go to https://www.kidney.org/professionals/ kdoqi/gfr%5Fcalculator    BUN/Creatinine Ratio 072/53/6644NOT APPLICABLE  6 - 22 (calc) Final   Sodium 11/05/2020 137  135 - 146 mmol/L Final   Potassium 11/05/2020 4.4  3.5 - 5.3 mmol/L Final   Chloride 11/05/2020 104  98 - 110 mmol/L Final   CO2 11/05/2020 28  20 - 32 mmol/L Final   Calcium 11/05/2020 9.5  8.6 - 10.2 mg/dL Final   Total Protein 11/05/2020 6.4  6.1 - 8.1 g/dL Final   Albumin 11/05/2020 4.1  3.6 - 5.1 g/dL Final   Globulin 11/05/2020 2.3  1.9 - 3.7 g/dL (calc) Final   AG Ratio 11/05/2020 1.8  1.0 - 2.5 (calc) Final   Total Bilirubin 11/05/2020 0.5  0.2 - 1.2 mg/dL Final   Alkaline phosphatase (APISO) 11/05/2020 50  31 - 125 U/L Final   AST 11/05/2020 17  10 - 30 U/L Final   ALT 11/05/2020 16  6 - 29 U/L Final   WBC 11/05/2020 5.2  3.8 - 10.8 Thousand/uL Final   RBC 11/05/2020 4.68  3.80 - 5.10 Million/uL Final  Hemoglobin 11/05/2020 13.8  11.7 - 15.5 g/dL Final   HCT 11/05/2020 41.3  35.0 - 45.0 % Final   MCV 11/05/2020 88.2  80.0 - 100.0 fL Final   MCH 11/05/2020 29.5  27.0 - 33.0 pg Final   MCHC 11/05/2020 33.4  32.0 - 36.0 g/dL Final   RDW 11/05/2020 12.0  11.0 - 15.0 % Final   Platelets 11/05/2020 241  140 - 400 Thousand/uL Final   MPV 11/05/2020 10.3  7.5 - 12.5 fL Final   Neutro Abs 11/05/2020 2,870  1,500 - 7,800 cells/uL Final   Lymphs Abs 11/05/2020 1,654  850 - 3,900 cells/uL Final   Absolute Monocytes 11/05/2020 463  200 - 950 cells/uL Final   Eosinophils Absolute 11/05/2020 182  15 - 500 cells/uL Final   Basophils Absolute 11/05/2020 31  0 - 200 cells/uL Final   Neutrophils Relative % 11/05/2020 55.2   % Final   Total Lymphocyte 11/05/2020 31.8  % Final   Monocytes Relative 11/05/2020 8.9  % Final   Eosinophils Relative 11/05/2020 3.5  % Final   Basophils Relative 11/05/2020 0.6  % Final   TSH 11/05/2020 1.80  mIU/L Final   Comment:           Reference Range .           > or = 20 Years  0.40-4.50 .                Pregnancy Ranges           First trimester    0.26-2.66           Second trimester   0.55-2.73           Third trimester    0.43-2.91    Blood pressure today is well controlled.  Lab work is outstanding.  Patient's ADD is well controlled with her current dose of Adderall.  She has no concerns today.  Pap smear is up-to-date and she is not yet due for mammograms.  Recommended a COVID-vaccine.  Patient politely declined.  The remainder of her preventative care is up-to-date.

## 2020-12-11 ENCOUNTER — Encounter: Payer: Self-pay | Admitting: Family Medicine

## 2020-12-11 MED ORDER — AMPHETAMINE-DEXTROAMPHETAMINE 30 MG PO TABS: 30.0000 mg | ORAL_TABLET | Freq: Two times a day (BID) | ORAL | 0 refills | Status: DC

## 2020-12-11 NOTE — Telephone Encounter (Signed)
Ok to refill??  Last office visit/ refill 11/12/2020.

## 2021-01-08 ENCOUNTER — Other Ambulatory Visit: Payer: Self-pay | Admitting: Family Medicine

## 2021-01-08 NOTE — Telephone Encounter (Signed)
Ok to refill??  Last office visit 11/12/2020.  Last refill 12/11/2020.

## 2021-01-09 MED ORDER — AMPHETAMINE-DEXTROAMPHETAMINE 30 MG PO TABS: 30.0000 mg | ORAL_TABLET | Freq: Two times a day (BID) | ORAL | 0 refills | Status: DC

## 2021-02-07 ENCOUNTER — Other Ambulatory Visit: Payer: Self-pay | Admitting: Family Medicine

## 2021-02-08 MED ORDER — AMPHETAMINE-DEXTROAMPHETAMINE 30 MG PO TABS: 30.0000 mg | ORAL_TABLET | Freq: Two times a day (BID) | ORAL | 0 refills | Status: DC

## 2021-02-08 NOTE — Telephone Encounter (Signed)
Ok to refill??  Last office visit 11/12/2020.  Last refill 01/09/2021

## 2021-03-11 ENCOUNTER — Other Ambulatory Visit: Payer: Self-pay | Admitting: Family Medicine

## 2021-03-11 MED ORDER — AMPHETAMINE-DEXTROAMPHETAMINE 30 MG PO TABS: 30.0000 mg | ORAL_TABLET | Freq: Two times a day (BID) | ORAL | 0 refills | Status: DC

## 2021-03-11 NOTE — Telephone Encounter (Signed)
LOV 11/12/20 Last refill 02/08/21, #60, 0 refills  Please review. Thanks!

## 2021-04-16 ENCOUNTER — Other Ambulatory Visit: Payer: Self-pay | Admitting: Family Medicine

## 2021-04-16 MED ORDER — AMPHETAMINE-DEXTROAMPHETAMINE 30 MG PO TABS: 30.0000 mg | ORAL_TABLET | Freq: Two times a day (BID) | ORAL | 0 refills | Status: DC

## 2021-05-13 ENCOUNTER — Other Ambulatory Visit: Payer: Self-pay | Admitting: Family Medicine

## 2021-05-13 MED ORDER — AMPHETAMINE-DEXTROAMPHETAMINE 30 MG PO TABS: 30.0000 mg | ORAL_TABLET | Freq: Two times a day (BID) | ORAL | 0 refills | Status: DC

## 2021-05-13 NOTE — Telephone Encounter (Signed)
Adderall refill request.  Last seen 11/12/2020, last filled 04/16/2021.

## 2021-06-13 ENCOUNTER — Other Ambulatory Visit: Payer: Self-pay | Admitting: Family Medicine

## 2021-06-14 MED ORDER — AMPHETAMINE-DEXTROAMPHETAMINE 30 MG PO TABS: 30.0000 mg | ORAL_TABLET | Freq: Two times a day (BID) | ORAL | 0 refills | Status: DC

## 2021-06-14 NOTE — Telephone Encounter (Signed)
LOV 11/12/20 Last refill 05/13/21, #30, 0 refills  Please review, thanks!

## 2021-07-08 ENCOUNTER — Encounter (HOSPITAL_BASED_OUTPATIENT_CLINIC_OR_DEPARTMENT_OTHER): Payer: Self-pay | Admitting: Emergency Medicine

## 2021-07-08 ENCOUNTER — Other Ambulatory Visit (HOSPITAL_BASED_OUTPATIENT_CLINIC_OR_DEPARTMENT_OTHER): Payer: Self-pay

## 2021-07-08 ENCOUNTER — Emergency Department (HOSPITAL_BASED_OUTPATIENT_CLINIC_OR_DEPARTMENT_OTHER): Payer: Self-pay

## 2021-07-08 ENCOUNTER — Emergency Department (HOSPITAL_BASED_OUTPATIENT_CLINIC_OR_DEPARTMENT_OTHER)
Admission: EM | Admit: 2021-07-08 | Discharge: 2021-07-08 | Disposition: A | Discharge status: Home / Self Care | Payer: Self-pay | Attending: Emergency Medicine | Admitting: Emergency Medicine

## 2021-07-08 ENCOUNTER — Other Ambulatory Visit: Payer: Self-pay

## 2021-07-08 DIAGNOSIS — N200 Calculus of kidney: Secondary | ICD-10-CM

## 2021-07-08 DIAGNOSIS — N132 Hydronephrosis with renal and ureteral calculous obstruction: Secondary | ICD-10-CM | POA: Insufficient documentation

## 2021-07-08 LAB — URINALYSIS, ROUTINE W REFLEX MICROSCOPIC
Bilirubin Urine: NEGATIVE
Glucose, UA: 100 mg/dL — AB
Hgb urine dipstick: NEGATIVE
Ketones, ur: 15 mg/dL — AB
Leukocytes,Ua: NEGATIVE
Nitrite: NEGATIVE
Protein, ur: NEGATIVE mg/dL
Specific Gravity, Urine: 1.024 (ref 1.005–1.030)
pH: 5.5 (ref 5.0–8.0)

## 2021-07-08 LAB — BASIC METABOLIC PANEL
Anion gap: 10 (ref 5–15)
BUN: 13 mg/dL (ref 6–20)
CO2: 20 mmol/L — ABNORMAL LOW (ref 22–32)
Calcium: 8.8 mg/dL — ABNORMAL LOW (ref 8.9–10.3)
Chloride: 106 mmol/L (ref 98–111)
Creatinine, Ser: 1.04 mg/dL — ABNORMAL HIGH (ref 0.44–1.00)
GFR, Estimated: >60 mL/min (ref >60)
Glucose, Bld: 125 mg/dL — ABNORMAL HIGH (ref 70–99)
Potassium: 3.6 mmol/L (ref 3.5–5.1)
Sodium: 136 mmol/L (ref 135–145)

## 2021-07-08 LAB — CBC
HCT: 36.1 % (ref 36.0–46.0)
Hemoglobin: 12.2 g/dL (ref 12.0–15.0)
MCH: 28.9 pg (ref 26.0–34.0)
MCHC: 33.8 g/dL (ref 30.0–36.0)
MCV: 85.5 fL (ref 80.0–100.0)
Platelets: 220 10*3/uL (ref 150–400)
RBC: 4.22 MIL/uL (ref 3.87–5.11)
RDW: 12 % (ref 11.5–15.5)
WBC: 8 10*3/uL (ref 4.0–10.5)
nRBC: 0 % (ref 0.0–0.2)

## 2021-07-08 LAB — HCG, SERUM, QUALITATIVE: Preg, Serum: NEGATIVE

## 2021-07-08 MED ORDER — MORPHINE SULFATE (PF) 4 MG/ML IV SOLN
4.0000 mg | Freq: Once | INTRAVENOUS | Status: AC
  Administered 2021-07-08: 4 mg via INTRAVENOUS
  Filled 2021-07-08: qty 1

## 2021-07-08 MED ORDER — KETOROLAC TROMETHAMINE 15 MG/ML IJ SOLN
15.0000 mg | Freq: Once | INTRAMUSCULAR | Status: AC
Start: 2021-07-08 — End: 2021-07-08
  Administered 2021-07-08: 15 mg via INTRAVENOUS
  Filled 2021-07-08: qty 1

## 2021-07-08 MED ORDER — KETOROLAC TROMETHAMINE 10 MG PO TABS
10.0000 mg | ORAL_TABLET | Freq: Four times a day (QID) | ORAL | 0 refills | Status: DC | PRN
  Filled 2021-07-08: qty 12, 3d supply, fill #0

## 2021-07-08 MED ORDER — ONDANSETRON HCL 4 MG/2ML IJ SOLN
4.0000 mg | Freq: Once | INTRAMUSCULAR | Status: AC
  Administered 2021-07-08: 4 mg via INTRAVENOUS
  Filled 2021-07-08: qty 2

## 2021-07-08 MED ORDER — KETOROLAC TROMETHAMINE 15 MG/ML IJ SOLN
15.0000 mg | Freq: Once | INTRAMUSCULAR | Status: AC
  Administered 2021-07-08: 15 mg via INTRAVENOUS
  Filled 2021-07-08: qty 1

## 2021-07-08 MED ORDER — TAMSULOSIN HCL 0.4 MG PO CAPS
0.4000 mg | ORAL_CAPSULE | Freq: Every day | ORAL | 0 refills | Status: AC
Start: 2021-07-08 — End: 2021-07-13
  Filled 2021-07-08: qty 5, 5d supply, fill #0

## 2021-07-08 MED ORDER — ONDANSETRON 4 MG PO TBDP
4.0000 mg | ORAL_TABLET | Freq: Three times a day (TID) | ORAL | 0 refills | Status: DC | PRN
  Filled 2021-07-08: qty 10, 4d supply, fill #0

## 2021-07-08 MED ORDER — SODIUM CHLORIDE 0.9 % IV BOLUS
1000.0000 mL | Freq: Once | INTRAVENOUS | Status: AC
  Administered 2021-07-08: 1000 mL via INTRAVENOUS

## 2021-07-08 NOTE — Discharge Instructions (Signed)
Call the urologist to make an appointment within the next 3 to 4 days. ? ? ?Return immediately back to the ER if: ? ?Your symptoms worsen within the next 12-24 hours. ?You develop new symptoms such as new fevers, persistent vomiting, new pain, shortness of breath, or new weakness or numbness, or if you have any other concerns. ? ?

## 2021-07-08 NOTE — ED Provider Notes (Signed)
?[facility] ?Provider Note ? ? ?CSN: [id number] ?Arrival date & time: 07/08/21  0751 ? ?  ? ?History ? ?Chief Complaint  ?Patient presents with  ? Flank Pain  ? ? ?Norma Harmon is a 36 y.o. female. ? ?Patient presents with sharp 10 out of 10 left flank pain that started last night.  Describes a persistent pain associate with nausea and vomiting.  No reports of fevers no cough.  No diarrhea reported.  No history of kidney stones in the past. ? ? ?  ? ?Home Medications ?Prior to Admission medications   ?Medication Sig Start Date End Date Taking? Authorizing Provider  ?ketorolac (TORADOL) 10 MG tablet Take 1 tablet (10 mg total) by mouth every 6 (six) hours as needed for up to 12 doses. 07/08/21  Yes Luna Fuse, MD  ?ondansetron (ZOFRAN-ODT) 4 MG disintegrating tablet Take 1 tablet (4 mg total) by mouth every 8 (eight) hours as needed for up to 10 doses for nausea or vomiting. 07/08/21  Yes [place], Greggory Brandy, MD  ?tamsulosin (FLOMAX) 0.4 MG CAPS capsule Take 1 capsule (0.4 mg total) by mouth daily after supper for 5 days. 07/08/21 07/13/21 Yes Luna Fuse, MD  ?amphetamine-dextroamphetamine (ADDERALL) 30 MG tablet Take 1 tablet by mouth 2 (two) times daily. 06/14/21   Susy Frizzle, MD  ?cyanocobalamin (,VITAMIN B-12,) 1000 MCG/ML injection Inject 0.1 mLs (100 mcg total) into the muscle every 30 (thirty) days. 11/12/20   Susy Frizzle, MD  ?diclofenac Sodium (VOLTAREN) 1 % GEL Apply 4 g topically 4 (four) times daily. 11/12/20   Susy Frizzle, MD  ?hydrochlorothiazide (HYDRODIURIL) 25 MG tablet TAKE 1 TABLET BY MOUTH EVERY DAY AS NEEDED FOR SWELLING ?Patient not taking: Reported on 11/12/2020 01/13/19   Susy Frizzle, MD  ?Levonorgestrel (SKYLA) 13.5 MG IUD Skyla 14 mcg/24 hrs (3 yrs) 13.5 mg intrauterine device ? Take 1 device by intrauterine route.    [provider]  ?SYRINGE-NEEDLE, DISP, 3 ML (BD ECLIPSE SYRINGE) 25G X 1" 3 ML MISC Use as directed to inject Vit B  IM  month 11/12/20   Susy Frizzle, MD  ?   ? ?Allergies    ?Percocet [oxycodone-acetaminophen] and Soy allergy   ? ?Review of Systems   ?Review of Systems  ?Constitutional:  Negative for fever.  ?HENT:  Negative for ear pain.   ?Eyes:  Negative for pain.  ?Respiratory:  Negative for cough.   ?Cardiovascular:  Negative for chest pain.  ?Gastrointestinal:  Negative for abdominal pain.  ?Genitourinary:  Positive for flank pain.  ?Musculoskeletal:  Negative for back pain.  ?Skin:  Negative for rash.  ?Neurological:  Negative for headaches.  ? ?Physical Exam ?Updated Vital Signs ?BP (!) 140/94 (BP Location: Left Arm)   Pulse 82   Temp (!) 96.6 ?F (35.9 ?C) (Temporal)   Resp 17   Ht 5' 5.5" (1.664 m)   Wt 72.6 kg   SpO2 100%   BMI 26.22 kg/m?  ?Physical Exam ?Constitutional:   ?   General: She is not in acute distress. ?   Appearance: Normal appearance.  ?HENT:  ?   Head: Normocephalic.  ?   Nose: Nose normal.  ?Eyes:  ?   Extraocular Movements: Extraocular movements intact.  ?Cardiovascular:  ?   Rate and Rhythm: Normal rate.  ?Pulmonary:  ?   Effort: Pulmonary effort is normal.  ?Abdominal:  ?   Tenderness: There is no abdominal tenderness. There is no  right CVA tenderness, left CVA tenderness, guarding or rebound.  ?Musculoskeletal:     ?   General: Normal range of motion.  ?   Cervical back: Normal range of motion.  ?Neurological:  ?   General: No focal deficit present.  ?   Mental Status: She is alert. Mental status is at baseline.  ? ? ?ED Results / Procedures / Treatments   ?Labs ?(all labs ordered are listed, but only abnormal results are displayed) ?Labs Reviewed  ?URINALYSIS, ROUTINE W REFLEX MICROSCOPIC - Abnormal; Notable for the following components:  ?    Result Value  ? Glucose, UA 100 (*)   ? Ketones, ur 15 (*)   ? All other components within normal limits  ?BASIC METABOLIC PANEL - Abnormal; Notable for the following components:  ? CO2 20 (*)   ? Glucose, Bld 125 (*)   ? Creatinine, Ser 1.04 (*)    ? Calcium 8.8 (*)   ? All other components within normal limits  ?CBC  ?HCG, SERUM, QUALITATIVE  ? ? ?EKG ?None ? ?Radiology ?CT Renal Stone Study ? ?Result Date: 07/08/2021 ?CLINICAL DATA:  Flank pain, kidney stone suspected EXAM: CT ABDOMEN AND PELVIS WITHOUT CONTRAST TECHNIQUE: Multidetector CT imaging of the abdomen and pelvis was performed following the standard protocol without IV contrast. RADIATION DOSE REDUCTION: This exam was performed according to the departmental dose-optimization program which includes automated exposure control, adjustment of the mA and/or kV according to patient size and/or use of iterative reconstruction technique. COMPARISON:  2016 FINDINGS: Lower chest: No acute abnormality. Hepatobiliary: No focal liver abnormality is seen. No gallstones, gallbladder wall thickening, or biliary dilatation. Pancreas: Unremarkable. Spleen: Unremarkable. Adrenals/Urinary Tract: Adrenals are unremarkable. Mild left perinephric stranding. Mild left hydroureteronephrosis. There is a 2 mm obstructing calculus at the left vesicoureteral junction. Bladder is unremarkable. Stomach/Bowel: Stomach is within normal limits. Bowel is normal in caliber. Postoperative changes at the ileocecal junction. Vascular/Lymphatic: No significant vascular abnormality on this noncontrast study. No enlarged nodes. Reproductive: Intrauterine device is present.  No adnexal mass. Other: Trace free fluid in the pelvis is probably physiologic. Abdominal wall is unremarkable. Musculoskeletal: Postoperative changes at L5-S1. IMPRESSION: 2 mm obstructing calculus at the left vesicoureteral junction with mild hydroureteronephrosis. Electronically Signed   By: Macy Mis M.D.   On: 07/08/2021 09:00   ? ?Procedures ?Procedures  ? ? ?Medications Ordered in ED ?Medications  ?sodium chloride 0.9 % bolus 1,000 mL (1,000 mLs Intravenous New Bag/Given 07/08/21 0821)  ?ketorolac (TORADOL) 15 MG/ML injection 15 mg (15 mg Intravenous Given  07/08/21 0821)  ?ketorolac (TORADOL) 15 MG/ML injection 15 mg (15 mg Intravenous Given 07/08/21 0941)  ?ondansetron [facility]) injection 4 mg (4 mg Intravenous Given 07/08/21 0941)  ? ? ?ED Course/ Medical Decision Making/ A&P ?  ?                        ?Medical Decision Making ?Amount and/or Complexity of Data Reviewed ?Labs: ordered. ?Radiology: ordered. ? ?Risk ?Prescription drug management. ? ? ?Patient placed on the monitor showing sinus rhythm no arrhythmias no tachycardia noted. ? ?Chart review shows primary care office visit November 14, 2020 for general medical exam. ? ?Differential for sudden onset flank pain is broad including aortic etiology, kidney stones, UTI, musculoskeletal pain versus other. ? ?Labs unremarkable white count normal chemistry normal pregnancy test is negative.  Urinalysis shows no evidence of infection. ? ?CT abdomen pelvis pursued going 2 mm kidney stone with mild  hydronephrosis likely the cause of her pain. ? ?Patient given several doses of Toradol IV fluids with resolution of pain.  Appearing much better at this time.  Recommending discharge home with outpatient follow-up with urology within the week.  Recommend immediate return for fevers worsening symptoms or any additional concerns. ? ? ? ? ? ? ? ?Final Clinical Impression(s) / ED Diagnoses ?Final diagnoses:  ?Kidney stone  ? ? ?Rx / DC Orders ?ED Discharge Orders   ? ?      Ordered  ?  ketorolac (TORADOL) 10 MG tablet  Every 6 hours PRN       ? 07/08/21 1126  ?  tamsulosin (FLOMAX) 0.4 MG CAPS capsule  Daily after supper       ? 07/08/21 1126  ?  ondansetron (ZOFRAN-ODT) 4 MG disintegrating tablet  Every 8 hours PRN       ? 07/08/21 1126  ? ?  ?  ? ?  ? ? ?  ?Luna Fuse, MD ?07/08/21 1126 ? ?

## 2021-07-08 NOTE — ED Triage Notes (Signed)
Pt arrives to ED via [facility] with c/o left sided flank pain. The pain started suddenly this morning and woke her from sleep. The pain is described as sharp, pressure, and is intermittent. Pt received 143mg of Fentanyl from EMS with her current pain 10/10.  ?

## 2021-07-17 ENCOUNTER — Other Ambulatory Visit: Payer: Self-pay | Admitting: Family Medicine

## 2021-07-17 NOTE — Telephone Encounter (Signed)
LOV 10/26/20 ?Last refill 06/14/21, #60, 0 refills ? ?Please review, thanks! ? ? ?

## 2021-07-18 MED ORDER — AMPHETAMINE-DEXTROAMPHETAMINE 30 MG PO TABS: 30.0000 mg | ORAL_TABLET | Freq: Two times a day (BID) | ORAL | 0 refills | Status: DC

## 2021-08-16 ENCOUNTER — Other Ambulatory Visit: Payer: Self-pay | Admitting: Family Medicine

## 2021-08-16 MED ORDER — AMPHETAMINE-DEXTROAMPHETAMINE 30 MG PO TABS: 30.0000 mg | ORAL_TABLET | Freq: Two times a day (BID) | ORAL | 0 refills | Status: DC

## 2021-08-16 NOTE — Telephone Encounter (Signed)
LOV 11/12/20 ?Last refill 07/18/21, #60, 0 refills ? ?Please review, thanks! ? ?

## 2021-09-09 ENCOUNTER — Other Ambulatory Visit: Payer: Self-pay | Admitting: Family Medicine

## 2021-09-09 MED ORDER — AMPHETAMINE-DEXTROAMPHETAMINE 30 MG PO TABS: 30.0000 mg | ORAL_TABLET | Freq: Two times a day (BID) | ORAL | 0 refills | Status: DC

## 2021-09-09 NOTE — Telephone Encounter (Signed)
LOV 11/12/20, OV scheduled 11/14/21 ? ?Last refill 08/16/21, #60, 0 refills ? ?Please review, thanks! ? ?

## 2021-10-11 ENCOUNTER — Other Ambulatory Visit: Payer: Self-pay | Admitting: Family Medicine

## 2021-10-14 ENCOUNTER — Other Ambulatory Visit: Payer: Self-pay | Admitting: Family Medicine

## 2021-10-14 MED ORDER — AMPHETAMINE-DEXTROAMPHETAMINE 30 MG PO TABS: 30.0000 mg | ORAL_TABLET | Freq: Two times a day (BID) | ORAL | 0 refills | Status: DC

## 2021-11-12 ENCOUNTER — Other Ambulatory Visit: Payer: Self-pay | Admitting: Family Medicine

## 2021-11-12 MED ORDER — AMPHETAMINE-DEXTROAMPHETAMINE 30 MG PO TABS: 30.0000 mg | ORAL_TABLET | Freq: Two times a day (BID) | ORAL | 0 refills | Status: DC

## 2021-11-12 NOTE — Telephone Encounter (Signed)
LOV 11/12/20 Last refill 10/14/21, #60, 2 refills  Please review, thanks!

## 2021-11-14 ENCOUNTER — Encounter: Payer: Self-pay | Admitting: Family Medicine

## 2021-11-25 ENCOUNTER — Other Ambulatory Visit: Payer: Self-pay

## 2021-11-25 DIAGNOSIS — I10 Essential (primary) hypertension: Secondary | ICD-10-CM

## 2021-11-26 ENCOUNTER — Other Ambulatory Visit: Payer: Self-pay

## 2021-11-26 ENCOUNTER — Other Ambulatory Visit: Payer: No Typology Code available for payment source

## 2021-11-26 DIAGNOSIS — I10 Essential (primary) hypertension: Secondary | ICD-10-CM

## 2021-11-26 LAB — CBC WITH DIFFERENTIAL/PLATELET
Absolute Monocytes: 385 cells/uL (ref 200–950)
Basophils Absolute: 60 cells/uL (ref 0–200)
Basophils Relative: 1.2 %
Eosinophils Absolute: 150 cells/uL (ref 15–500)
Eosinophils Relative: 3 %
HCT: 39.9 % (ref 35.0–45.0)
Hemoglobin: 13.6 g/dL (ref 11.7–15.5)
Lymphs Abs: 1735 cells/uL (ref 850–3900)
MCH: 29.8 pg (ref 27.0–33.0)
MCHC: 34.1 g/dL (ref 32.0–36.0)
MCV: 87.5 fL (ref 80.0–100.0)
MPV: 9.9 fL (ref 7.5–12.5)
Monocytes Relative: 7.7 %
Neutro Abs: 2670 cells/uL (ref 1500–7800)
Neutrophils Relative %: 53.4 %
Platelets: 254 10*3/uL (ref 140–400)
RBC: 4.56 10*6/uL (ref 3.80–5.10)
RDW: 12 % (ref 11.0–15.0)
Total Lymphocyte: 34.7 %
WBC: 5 10*3/uL (ref 3.8–10.8)

## 2021-11-27 LAB — LIPID PANEL
Cholesterol: 187 mg/dL (ref <200)
HDL: 81 mg/dL (ref >=50)
LDL Cholesterol (Calc): 92 mg/dL (calc)
Non-HDL Cholesterol (Calc): 106 mg/dL (calc) (ref <130)
Total CHOL/HDL Ratio: 2.3 (calc) (ref <5.0)
Triglycerides: 59 mg/dL (ref <150)

## 2021-11-27 LAB — BASIC METABOLIC PANEL
BUN: 12 mg/dL (ref 7–25)
CO2: 25 mmol/L (ref 20–32)
Calcium: 9.2 mg/dL (ref 8.6–10.2)
Chloride: 107 mmol/L (ref 98–110)
Creat: 0.95 mg/dL (ref 0.50–0.97)
Glucose, Bld: 93 mg/dL (ref 65–99)
Potassium: 4.4 mmol/L (ref 3.5–5.3)
Sodium: 139 mmol/L (ref 135–146)

## 2021-11-27 LAB — TSH: TSH: 1.14 mIU/L

## 2021-11-27 LAB — VITAMIN B12: Vitamin B-12: 369 pg/mL (ref 200–1100)

## 2021-12-06 ENCOUNTER — Ambulatory Visit (INDEPENDENT_AMBULATORY_CARE_PROVIDER_SITE_OTHER): Payer: No Typology Code available for payment source | Admitting: Family Medicine

## 2021-12-06 VITALS — BP 140/92 | HR 88 | Temp 98.0°F | Resp 14 | Wt 169.0 lb

## 2021-12-06 DIAGNOSIS — M545 Low back pain, unspecified: Secondary | ICD-10-CM

## 2021-12-06 LAB — URINALYSIS, ROUTINE W REFLEX MICROSCOPIC
Bilirubin Urine: NEGATIVE
Glucose, UA: NEGATIVE
Hgb urine dipstick: NEGATIVE
Ketones, ur: NEGATIVE
Leukocytes,Ua: NEGATIVE
Nitrite: NEGATIVE
Protein, ur: NEGATIVE
Specific Gravity, Urine: 1.024 (ref 1.001–1.035)
pH: 5.5 (ref 5.0–8.0)

## 2021-12-06 MED ORDER — AMLODIPINE BESYLATE 5 MG PO TABS: 5.0000 mg | ORAL_TABLET | Freq: Every day | ORAL | 3 refills | Status: DC

## 2021-12-06 NOTE — Progress Notes (Signed)
Subjective:    Patient ID: Norma Harmon, female    DOB: November 27, 1985, 36 y.o.   MRN: [id number]  HPI Patient is a very sweet 36 year old Caucasian female.  She works in Architect.  She is frequently getting dehydrated.  Therefore she takes hydrochlorothiazide for her blood pressure, she often gets a headache.  She is not checking her blood pressure regularly however today is elevated at 140/92.  She does take Adderall for ADD.  She is not taking any NSAIDs regularly.  She is not on any birth control other than her IUD.  Her Pap smear was performed by her gynecologist.  She is not yet due for mammogram. Past medical history is complex:  She suffered a crush injury to her left arm when it was stuck in a conveyor belt. Although there is no visible deformity to the arm now she still has residual pain in the left arm. She also has a history of L5-S1 lumbar fusion due to chronic low back pain and degenerative disc disease. She was diagnosed with a condition as a child and has been on the medication for a long time. She also has a history of a partial colectomy for Crohn's colitis. Past Medical History:  Diagnosis Date   ADHD (attention deficit hyperactivity disorder)    recently started on Adderol   Allergic rhinitis    Anxiety    Arthritis    lumbar back   Colitis    Crushing injury of left hand    Depression    Diarrhea    Endometriosis    Dr. Rogue Bussing G[facility]   Family history of malignant neoplasm of gastrointestinal tract    GERD (gastroesophageal reflux disease)    IBS (irritable bowel syndrome)    Lumbosacral radiculopathy at L4    left side, (Dr. Towanda Malkin group).    Migraine headache    Ovarian cyst    Pernicious anemia    PONV (postoperative nausea and vomiting)    Rheumatoid arthritis ([place])    was seeing Ouida Sills but no longer   Skin cancer    abdomen and neck- states not melanoma   Past Surgical History:  Procedure Laterality Date   APPENDECTOMY  10/2008   COLON RESECTION   04/02/2011   Procedure: COLON RESECTION LAPAROSCOPIC;  Surgeon: Pedro Earls, MD;  Location: [facility];  Service: General;  Laterality: N/A;  Laparscopic  Assisted Ileocecostomy    FRACTURE SURGERY     HAND SURGERY     left   NASAL SINUS SURGERY     SMALL INTESTINE SURGERY     SPINE SURGERY  08/2005   L5 - S1 fusion   WISDOM TOOTH EXTRACTION     Current Outpatient Medications on File Prior to Visit  Medication Sig Dispense Refill   amphetamine-dextroamphetamine (ADDERALL) 30 MG tablet Take 1 tablet by mouth 2 (two) times daily. 60 tablet 0   Levonorgestrel (SKYLA) 13.5 MG IUD Skyla 14 mcg/24 hrs (3 yrs) 13.5 mg intrauterine device  Take 1 device by intrauterine route.     cyanocobalamin (,VITAMIN B-12,) 1000 MCG/ML injection Inject 0.1 mLs (100 mcg total) into the muscle every 30 (thirty) days. 3 mL 9   diclofenac Sodium (VOLTAREN) 1 % GEL Apply 4 g topically 4 (four) times daily. 100 g 3   ketorolac (TORADOL) 10 MG tablet Take 1 tablet (10 mg total) by mouth every 6 (six) hours as needed for up to 12 doses. 12 tablet 0   ondansetron (ZOFRAN-ODT) 4 MG disintegrating  tablet Take 1 tablet (4 mg total) by mouth every 8 (eight) hours as needed for up to 10 doses for nausea or vomiting. 10 tablet 0   SYRINGE-NEEDLE, DISP, 3 ML (BD ECLIPSE SYRINGE) 25G X 1" 3 ML MISC Use as directed to inject Vit B IM  month 50 each 0   No current facility-administered medications on file prior to visit.   Allergies  Allergen Reactions   Percocet [Oxycodone-Acetaminophen] Swelling   Soy Allergy     Abdominal pains   Social History   Socioeconomic History   Marital status: Divorced    Spouse name: Not on file   Number of children: 0   Years of education: Not on file   Highest education level: Not on file  Occupational History   Occupation: Corporate investment banker    Employer: [employer]  Tobacco Use   Smoking status: Every Day    Packs/day: 0.25    Years: 2.00    Total pack years: 0.50     Types: Cigarettes   Smokeless tobacco: Former    Quit date: 01/29/2011  Substance and Sexual Activity   Alcohol use: No    Comment: rarely   Drug use: No   Sexual activity: Yes    Birth control/protection: Other-see comments    Comment: nuvaring  Other Topics Concern   Not on file  Social History Narrative   Not on file   Social Determinants of Health   Financial Resource Strain: Not on file  Food Insecurity: Not on file  Transportation Needs: Not on file  Physical Activity: Not on file  Stress: Not on file  Social Connections: Not on file  Intimate Partner Violence: Not on file   Family History  Problem Relation Age of Onset   Colon cancer Paternal Grandfather    Breast cancer Paternal Grandmother    Colon polyps Paternal Grandmother    Cancer Paternal Grandmother        breast   Diabetes Maternal Grandmother    Heart disease Maternal Grandmother    Cirrhosis Maternal Grandfather        heavy drinker   ADD / ADHD Brother       Review of Systems  All other systems reviewed and are negative.      Objective:   Physical Exam Vitals reviewed.  Constitutional:      General: She is not in acute distress.    Appearance: She is well-developed. She is not diaphoretic.  HENT:     Head: Normocephalic and atraumatic.     Right Ear: External ear normal.     Left Ear: External ear normal.     Nose: Nose normal.     Mouth/Throat:     Pharynx: No oropharyngeal exudate.  Eyes:     Conjunctiva/sclera: Conjunctivae normal.     Pupils: Pupils are equal, round, and reactive to light.  Neck:     Thyroid: No thyromegaly.     Vascular: No JVD.  Cardiovascular:     Rate and Rhythm: Normal rate and regular rhythm.     Heart sounds: Normal heart sounds. No murmur heard. Pulmonary:     Effort: Pulmonary effort is normal. No respiratory distress.     Breath sounds: Normal breath sounds. No wheezing or rales.  Abdominal:     General: Bowel sounds are normal. There is no  distension.     Palpations: Abdomen is soft.     Tenderness: There is no abdominal tenderness. There is no guarding or  rebound.  Musculoskeletal:     Cervical back: Neck supple.  Lymphadenopathy:     Cervical: No cervical adenopathy.  Skin:    Coloration: Skin is not pale.     Findings: No erythema or rash.  Neurological:     Mental Status: She is alert and oriented to person, place, and time.     Cranial Nerves: No cranial nerve deficit.     Coordination: Coordination normal.     Deep Tendon Reflexes: Reflexes are normal and symmetric. Reflexes normal.  Psychiatric:        Mood and Affect: Mood normal.        Speech: Speech normal.        Behavior: Behavior normal.        Thought Content: Thought content normal.        Judgment: Judgment normal.          Assessment & Plan:  Acute midline low back pain without sciatica - Plan: Urinalysis, Routine w reflex microscopic Orders Only on 11/26/2021  Component Date Value Ref Range Status   WBC 11/26/2021 5.0  3.8 - 10.8 Thousand/uL Final   RBC 11/26/2021 4.56  3.80 - 5.10 Million/uL Final   Hemoglobin 11/26/2021 13.6  11.7 - 15.5 g/dL Final   HCT 11/26/2021 39.9  35.0 - 45.0 % Final   MCV 11/26/2021 87.5  80.0 - 100.0 fL Final   MCH 11/26/2021 29.8  27.0 - 33.0 pg Final   MCHC 11/26/2021 34.1  32.0 - 36.0 g/dL Final   RDW 11/26/2021 12.0  11.0 - 15.0 % Final   Platelets 11/26/2021 254  140 - 400 Thousand/uL Final   MPV 11/26/2021 9.9  7.5 - 12.5 fL Final   Neutro Abs 11/26/2021 2,670  1,500 - 7,800 cells/uL Final   Lymphs Abs 11/26/2021 1,735  850 - 3,900 cells/uL Final   Absolute Monocytes 11/26/2021 385  200 - 950 cells/uL Final   Eosinophils Absolute 11/26/2021 150  15 - 500 cells/uL Final   Basophils Absolute 11/26/2021 60  0 - 200 cells/uL Final   Neutrophils Relative % 11/26/2021 53.4  % Final   Total Lymphocyte 11/26/2021 34.7  % Final   Monocytes Relative 11/26/2021 7.7  % Final   Eosinophils Relative 11/26/2021 3.0   % Final   Basophils Relative 11/26/2021 1.2  % Final  Lab on 11/26/2021  Component Date Value Ref Range Status   Vitamin B-12 11/26/2021 369  200 - 1,100 pg/mL Final   Comment: . Please Note: Although the reference range for vitamin B12 is [phone] pg/mL, it has been reported that between 5 and 10% of patients with values between 200 and 400 pg/mL may experience neuropsychiatric and hematologic abnormalities due to occult B12 deficiency; less than 1% of patients with values above 400 pg/mL will have symptoms. .    TSH 11/26/2021 1.14  mIU/L Final   Comment:           Reference Range .           > or = 20 Years  0.40-4.50 .                Pregnancy Ranges           First trimester    0.26-2.66           Second trimester   0.55-2.73           Third trimester    0.43-2.91    Glucose, Bld 11/26/2021 93  65 - 99  mg/dL Final   Comment: .            Fasting reference interval .    BUN 11/26/2021 12  7 - 25 mg/dL Final   Creat 11/26/2021 0.95  0.50 - 0.97 mg/dL Final   BUN/Creatinine Ratio 11/26/2021 SEE NOTE:  6 - 22 (calc) Final   Comment:    Not Reported: BUN and Creatinine are within    reference range. .    Sodium 11/26/2021 139  135 - 146 mmol/L Final   Potassium 11/26/2021 4.4  3.5 - 5.3 mmol/L Final   Chloride 11/26/2021 107  98 - 110 mmol/L Final   CO2 11/26/2021 25  20 - 32 mmol/L Final   Calcium 11/26/2021 9.2  8.6 - 10.2 mg/dL Final   Cholesterol 11/26/2021 187  <200 mg/dL Final   HDL 11/26/2021 81  > OR = 50 mg/dL Final   Triglycerides 11/26/2021 59  <150 mg/dL Final   LDL Cholesterol (Calc) 11/26/2021 92  mg/dL (calc) Final   Comment: Reference range: <100 . Desirable range <100 mg/dL for primary prevention;   <70 mg/dL for patients with CHD or diabetic patients  with > or = 2 CHD risk factors. Marland Kitchen LDL-C is now calculated using the Martin-Hopkins  calculation, which is a validated novel method providing  better accuracy than the Friedewald equation in the   estimation of LDL-C.  Cresenciano Genre et al. Annamaria Helling. 7628;315(17): 2061-2068  (http://education.[facility].com/faq/FAQ164)    Total CHOL/HDL Ratio 11/26/2021 2.3  <5.0 (calc) Final   Non-HDL Cholesterol (Calc) 11/26/2021 106  <130 mg/dL (calc) Final   Comment: For patients with diabetes plus 1 major ASCVD risk  factor, treating to a non-HDL-C goal of <100 mg/dL  (LDL-C of <70 mg/dL) is considered a therapeutic  option.    I am concerned about her blood pressure.  I am concerned about her getting dehydrated.  Recommended switching hydrochlorothiazide to amlodipine 5 mg a day and monitoring her blood pressure to see if this will work better for her.  Cholesterol and blood work looks outstanding.  I continue to encourage her to quit smoking.  I will defer her Pap smear to her gynecologist.  I will check a urinalysis today as she has some low back pain but I believe this is most likely muscular as she denies any dysuria

## 2021-12-09 ENCOUNTER — Other Ambulatory Visit: Payer: Self-pay | Admitting: Family Medicine

## 2021-12-09 MED ORDER — AMPHETAMINE-DEXTROAMPHETAMINE 30 MG PO TABS: 30.0000 mg | ORAL_TABLET | Freq: Two times a day (BID) | ORAL | 0 refills | Status: DC

## 2022-01-09 ENCOUNTER — Other Ambulatory Visit: Payer: Self-pay

## 2022-01-09 NOTE — Telephone Encounter (Signed)
Pharmacy faxed a refill request for cyanocobalamin (,VITAMIN B-12,) 1000 MCG/ML injection [[id number]]    Order Details Dose: 100 mcg Route: Intramuscular Frequency: Every 30 days  Dispense Quantity: 3 mL Refills: 9        Sig: Inject 0.1 mLs (100 mcg total) into the muscle every 30 (thirty) days.       Start Date: 11/12/20 End Date: --  Written Date: 11/12/20 Expiration Date: 11/12/21  Original Order:  cyanocobalamin (,VITAMIN B-12,) 1000 MCG/ML injection [[id number]]

## 2022-01-10 ENCOUNTER — Other Ambulatory Visit: Payer: Self-pay | Admitting: Family Medicine

## 2022-01-10 MED ORDER — AMPHETAMINE-DEXTROAMPHETAMINE 30 MG PO TABS: 30.0000 mg | ORAL_TABLET | Freq: Two times a day (BID) | ORAL | 0 refills | Status: DC

## 2022-01-10 MED ORDER — CYANOCOBALAMIN 1000 MCG/ML IJ SOLN: 100.0000 ug | INTRAMUSCULAR | 9 refills | Status: DC

## 2022-01-10 NOTE — Telephone Encounter (Signed)
Pt had physical 12/06/2021.  Due to computer glitch it's not being picked up in the protocol.  Requested Prescriptions  Pending Prescriptions Disp Refills  . cyanocobalamin (VITAMIN B12) 1000 MCG/ML injection 3 mL 9    Sig: Inject 0.1 mLs (100 mcg total) into the muscle every 30 (thirty) days.     Endocrinology:  Vitamins - Vitamin B12 Failed - 01/09/2022 11:30 AM      Failed - Valid encounter within last 12 months    Recent Outpatient Visits          1 year ago General medical exam   [place] Susy Frizzle, MD   2 years ago Preseptal cellulitis of left eye   [place] Susy Frizzle, MD   2 years ago Benign essential HTN   [place] Susy Frizzle, MD   3 years ago Benign essential HTN   [place] Susy Frizzle, MD   4 years ago Chronic fatigue   [place] Pickard, Cammie Mcgee, MD      Future Appointments            In 11 months Pickard, Cammie Mcgee, MD [place]           Passed - HCT in normal range and within 360 days    HCT  Date Value Ref Range Status  11/26/2021 39.9 35.0 - 45.0 % Final         Passed - HGB in normal range and within 360 days    Hemoglobin  Date Value Ref Range Status  11/26/2021 13.6 11.7 - 15.5 g/dL Final         Passed - B12 Level in normal range and within 360 days    Vitamin B-12  Date Value Ref Range Status  11/26/2021 369 200 - 1,100 pg/mL Final    Comment:    . Please Note: Although the reference range for vitamin B12 is [phone] pg/mL, it has been reported that between 5 and 10% of patients with values between 200 and 400 pg/mL may experience neuropsychiatric and hematologic abnormalities due to occult B12 deficiency; less than 1% of patients with values above 400 pg/mL will have symptoms. Marland Kitchen

## 2022-01-13 ENCOUNTER — Encounter: Payer: Self-pay | Admitting: Family Medicine

## 2022-02-03 ENCOUNTER — Encounter: Payer: Self-pay | Admitting: Family Medicine

## 2022-02-03 ENCOUNTER — Other Ambulatory Visit: Payer: Self-pay | Admitting: Family Medicine

## 2022-02-04 ENCOUNTER — Other Ambulatory Visit: Payer: Self-pay | Admitting: Family Medicine

## 2022-02-04 MED ORDER — AMPHETAMINE-DEXTROAMPHETAMINE 30 MG PO TABS: 30.0000 mg | ORAL_TABLET | Freq: Two times a day (BID) | ORAL | 0 refills | Status: DC

## 2022-02-04 MED ORDER — MUPIROCIN 2 % EX OINT
1.0000 | TOPICAL_OINTMENT | Freq: Two times a day (BID) | CUTANEOUS | 5 refills | Status: DC
Start: 2022-02-04 — End: 2022-12-08

## 2022-03-10 ENCOUNTER — Other Ambulatory Visit: Payer: Self-pay | Admitting: Family Medicine

## 2022-03-11 ENCOUNTER — Other Ambulatory Visit: Payer: Self-pay | Admitting: Family Medicine

## 2022-03-11 MED ORDER — AMPHETAMINE-DEXTROAMPHETAMINE 30 MG PO TABS: 30.0000 mg | ORAL_TABLET | Freq: Two times a day (BID) | ORAL | 0 refills | Status: DC

## 2022-03-13 MED ORDER — AMPHETAMINE-DEXTROAMPHETAMINE 30 MG PO TABS: 30.0000 mg | ORAL_TABLET | Freq: Two times a day (BID) | ORAL | 0 refills | Status: DC

## 2022-04-11 ENCOUNTER — Other Ambulatory Visit: Payer: Self-pay | Admitting: Family Medicine

## 2022-04-11 MED ORDER — AMPHETAMINE-DEXTROAMPHETAMINE 30 MG PO TABS: 30.0000 mg | ORAL_TABLET | Freq: Two times a day (BID) | ORAL | 0 refills | Status: DC

## 2022-04-14 ENCOUNTER — Other Ambulatory Visit: Payer: Self-pay | Admitting: Family Medicine

## 2022-04-14 ENCOUNTER — Encounter: Payer: Self-pay | Admitting: Family Medicine

## 2022-04-14 ENCOUNTER — Telehealth: Payer: Self-pay

## 2022-04-14 MED ORDER — AMPHETAMINE-DEXTROAMPHETAMINE 30 MG PO TABS: 30.0000 mg | ORAL_TABLET | Freq: Two times a day (BID) | ORAL | 0 refills | Status: DC

## 2022-04-14 NOTE — Telephone Encounter (Signed)
Pt called and [facility] [address] is out of the pt's Adderall dosage. Pt asks [facility]? Pharm added to pt's list. Last RF 03/11/2022. Last OV 12/06/2021. Thanks.

## 2022-05-09 ENCOUNTER — Other Ambulatory Visit: Payer: Self-pay | Admitting: Family Medicine

## 2022-05-09 MED ORDER — AMPHETAMINE-DEXTROAMPHETAMINE 30 MG PO TABS: 30.0000 mg | ORAL_TABLET | Freq: Two times a day (BID) | ORAL | 0 refills | Status: DC

## 2022-06-06 IMAGING — CT CT RENAL STONE PROTOCOL
3 of 4 series · 7 of 46 positions shown, 13 images · non-contrast
Comparison: [id number]

CLINICAL DATA: Flank pain, kidney stone suspected



[Series 4: lungs · axial · 0.82mm/px · z∈[+1195,+1230]mm · 3 of 15 slices shown, 7 images]
[im 4/15  soft-tissue]
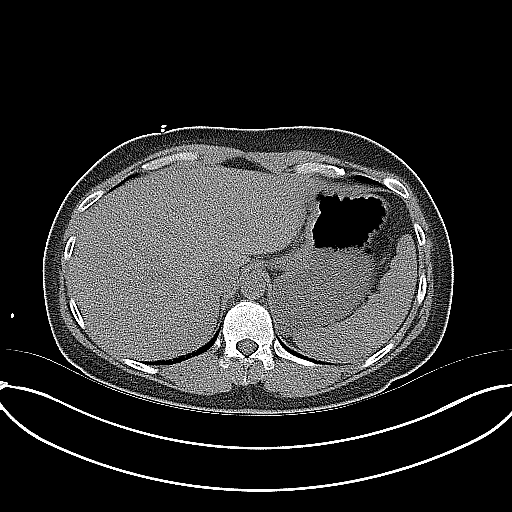
[im 4/15  lung]
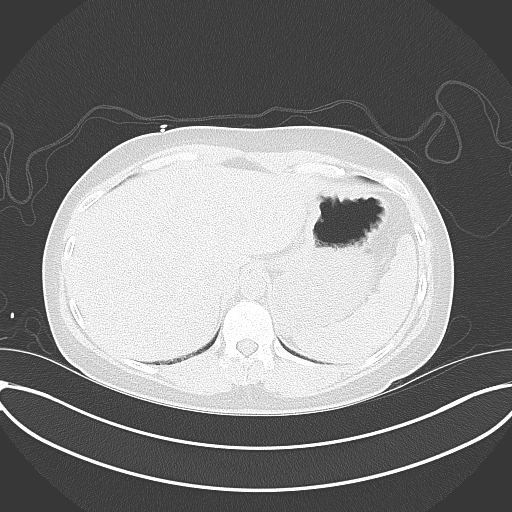
[im 4/15  bone]
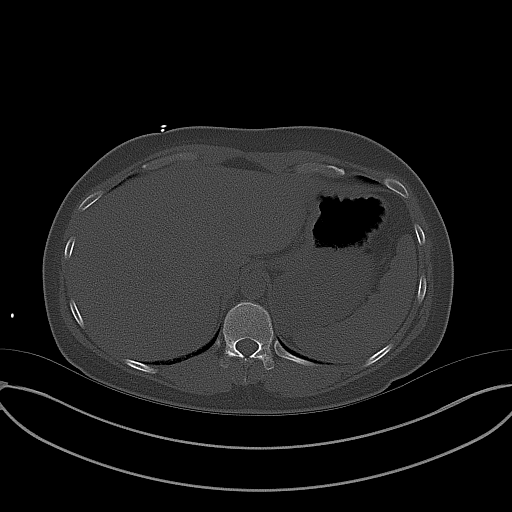
[im 8/15  soft-tissue]
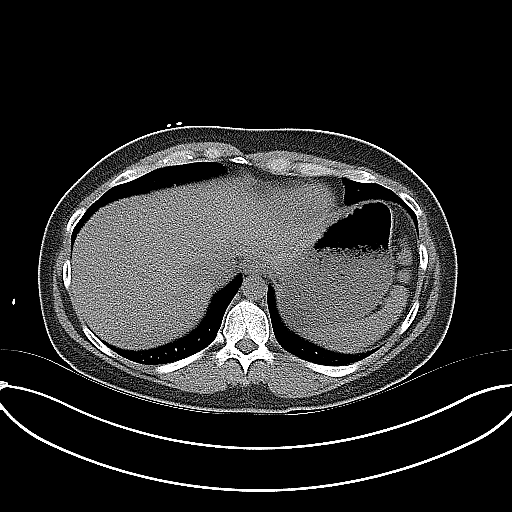
[im 8/15  lung]
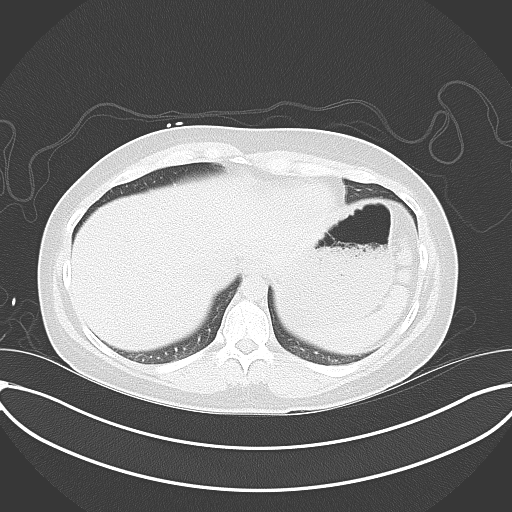
[im 11/15  soft-tissue]
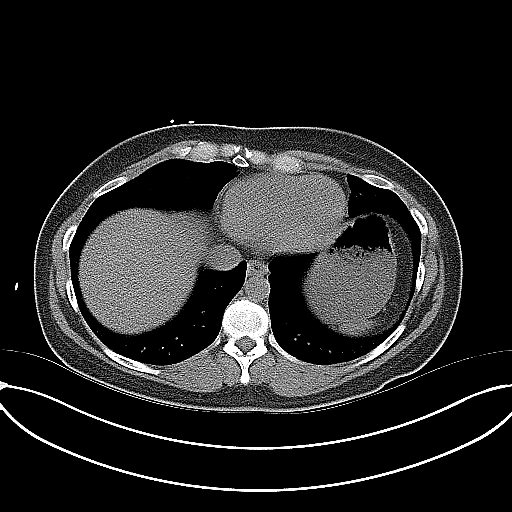
[im 11/15  lung]
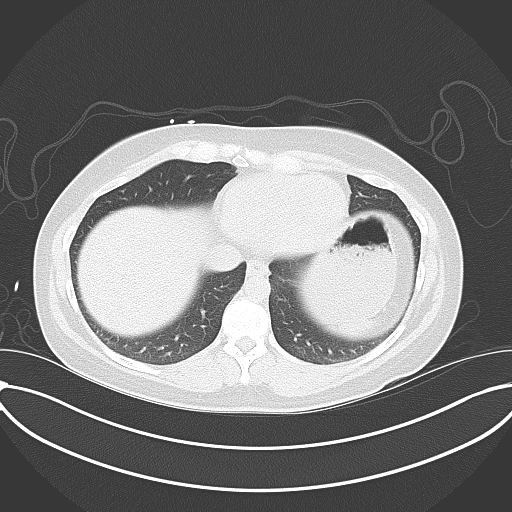

[Series 5: coronal · coronal · 0.83mm/px · 3 of 83 slices shown, 4 images]
[im 28/83  soft-tissue]
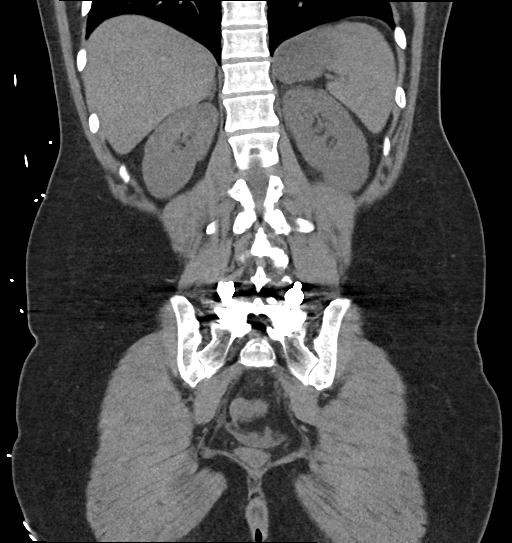
[im 37/83  soft-tissue]
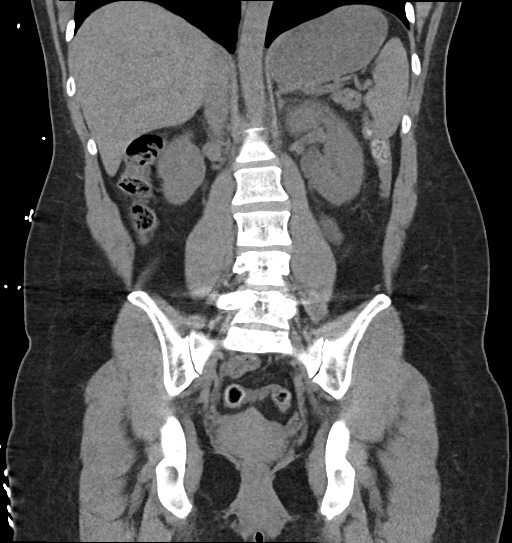
[im 37/83  bone]
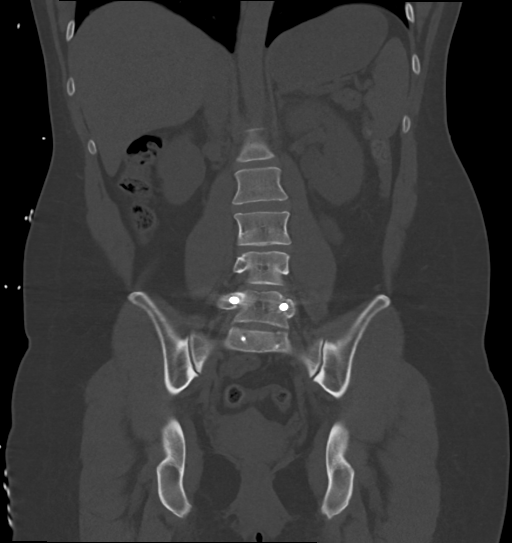
[im 46/83  soft-tissue]
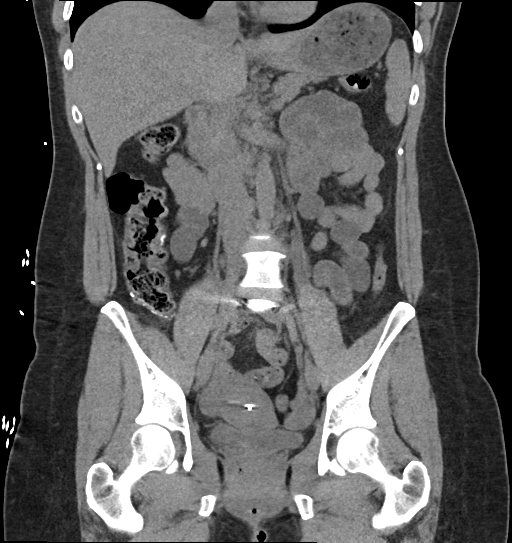

[Series 6: sagittal · sagittal · 0.48mm/px · 1 of 133 slices shown, 2 images]
[im 45/133  soft-tissue]
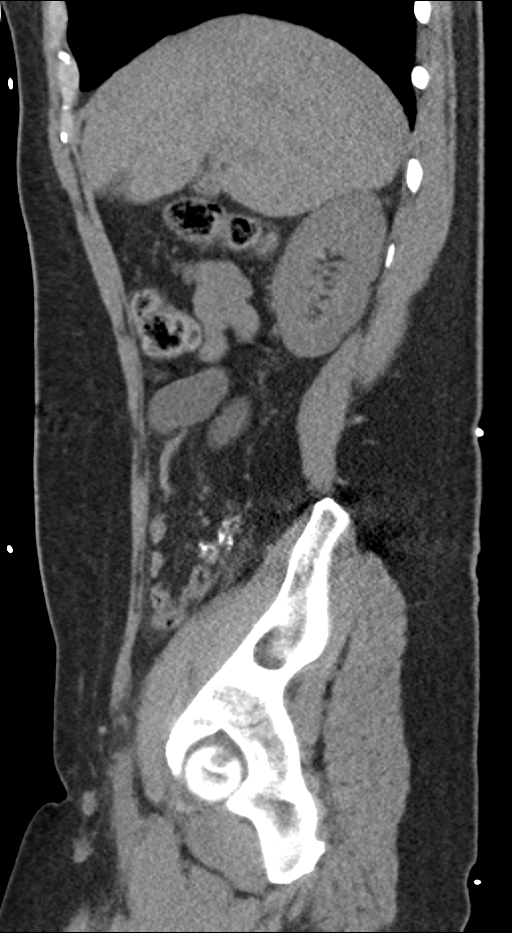
[im 45/133  bone]
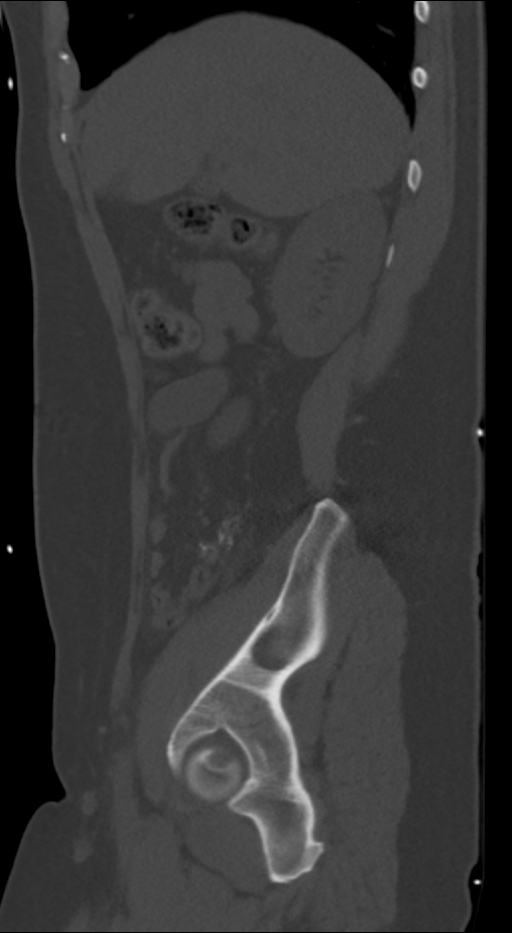

[7 of 46 positions shown; findings below may reference images not displayed]

FINDINGS: Lower chest: No acute abnormality.

Hepatobiliary: No focal liver abnormality is seen. No gallstones,
gallbladder wall thickening, or biliary dilatation.

Pancreas: Unremarkable.

Spleen: Unremarkable.

Adrenals/Urinary Tract: Adrenals are unremarkable. Mild left
perinephric stranding. Mild left hydroureteronephrosis. There is a 2
mm obstructing calculus at the left vesicoureteral junction. Bladder
is unremarkable.

Stomach/Bowel: Stomach is within normal limits. Bowel is normal in
caliber. Postoperative changes at the ileocecal junction.

Vascular/Lymphatic: No significant vascular abnormality on this
noncontrast study. No enlarged nodes.

Reproductive: Intrauterine device is present.  No adnexal mass.

Other: Trace free fluid in the pelvis is probably physiologic.
Abdominal wall is unremarkable.

Musculoskeletal: Postoperative changes at L5-S1.
IMPRESSION: 2 mm obstructing calculus at the left vesicoureteral junction with
mild hydroureteronephrosis.

## 2022-06-12 ENCOUNTER — Other Ambulatory Visit: Payer: Self-pay | Admitting: Family Medicine

## 2022-06-13 MED ORDER — AMPHETAMINE-DEXTROAMPHETAMINE 30 MG PO TABS: 30.0000 mg | ORAL_TABLET | Freq: Two times a day (BID) | ORAL | 0 refills | Status: DC

## 2022-07-13 ENCOUNTER — Other Ambulatory Visit: Payer: Self-pay | Admitting: Family Medicine

## 2022-07-14 MED ORDER — AMPHETAMINE-DEXTROAMPHETAMINE 30 MG PO TABS: 30.0000 mg | ORAL_TABLET | Freq: Two times a day (BID) | ORAL | 0 refills | Status: DC

## 2022-08-11 ENCOUNTER — Other Ambulatory Visit: Payer: Self-pay | Admitting: Family Medicine

## 2022-08-12 MED ORDER — AMPHETAMINE-DEXTROAMPHETAMINE 30 MG PO TABS: 30.0000 mg | ORAL_TABLET | Freq: Two times a day (BID) | ORAL | 0 refills | Status: DC

## 2022-09-09 ENCOUNTER — Other Ambulatory Visit: Payer: Self-pay | Admitting: Family Medicine

## 2022-09-09 MED ORDER — AMPHETAMINE-DEXTROAMPHETAMINE 30 MG PO TABS: 30.0000 mg | ORAL_TABLET | Freq: Two times a day (BID) | ORAL | 0 refills | Status: DC

## 2022-10-13 ENCOUNTER — Other Ambulatory Visit: Payer: Self-pay | Admitting: Family Medicine

## 2022-10-15 ENCOUNTER — Other Ambulatory Visit: Payer: Self-pay | Admitting: Family Medicine

## 2022-10-16 MED ORDER — AMPHETAMINE-DEXTROAMPHETAMINE 30 MG PO TABS: 30.0000 mg | ORAL_TABLET | Freq: Two times a day (BID) | ORAL | 0 refills | Status: DC

## 2022-11-13 ENCOUNTER — Other Ambulatory Visit: Payer: Self-pay | Admitting: Family Medicine

## 2022-11-13 MED ORDER — AMPHETAMINE-DEXTROAMPHETAMINE 30 MG PO TABS: 30.0000 mg | ORAL_TABLET | Freq: Two times a day (BID) | ORAL | 0 refills | Status: DC

## 2022-12-08 ENCOUNTER — Ambulatory Visit (INDEPENDENT_AMBULATORY_CARE_PROVIDER_SITE_OTHER): Payer: No Typology Code available for payment source | Admitting: Family Medicine

## 2022-12-08 ENCOUNTER — Encounter: Payer: Self-pay | Admitting: Family Medicine

## 2022-12-08 VITALS — BP 126/82 | HR 88 | Temp 98.4°F | Ht 65.5 in | Wt 158.8 lb

## 2022-12-08 DIAGNOSIS — Z23 Encounter for immunization: Secondary | ICD-10-CM

## 2022-12-08 DIAGNOSIS — I10 Essential (primary) hypertension: Secondary | ICD-10-CM

## 2022-12-08 DIAGNOSIS — Z0001 Encounter for general adult medical examination with abnormal findings: Secondary | ICD-10-CM

## 2022-12-08 DIAGNOSIS — N921 Excessive and frequent menstruation with irregular cycle: Secondary | ICD-10-CM | POA: Insufficient documentation

## 2022-12-08 DIAGNOSIS — F17200 Nicotine dependence, unspecified, uncomplicated: Secondary | ICD-10-CM

## 2022-12-08 DIAGNOSIS — R87610 Atypical squamous cells of undetermined significance on cytologic smear of cervix (ASC-US): Secondary | ICD-10-CM | POA: Insufficient documentation

## 2022-12-08 DIAGNOSIS — Z0000 Encounter for general adult medical examination without abnormal findings: Secondary | ICD-10-CM

## 2022-12-08 DIAGNOSIS — E538 Deficiency of other specified B group vitamins: Secondary | ICD-10-CM

## 2022-12-08 LAB — CBC WITH DIFFERENTIAL/PLATELET
Absolute Monocytes: 661 cells/uL (ref 200–950)
Basophils Absolute: 52 cells/uL (ref 0–200)
Basophils Relative: 0.9 %
Eosinophils Absolute: 128 cells/uL (ref 15–500)
Eosinophils Relative: 2.2 %
HCT: 41 % (ref 35.0–45.0)
Hemoglobin: 13.9 g/dL (ref 11.7–15.5)
Lymphs Abs: 1746 cells/uL (ref 850–3900)
MCH: 30.9 pg (ref 27.0–33.0)
MCHC: 33.9 g/dL (ref 32.0–36.0)
MCV: 91.1 fL (ref 80.0–100.0)
MPV: 10.2 fL (ref 7.5–12.5)
Monocytes Relative: 11.4 %
Neutro Abs: 3213 cells/uL (ref 1500–7800)
Neutrophils Relative %: 55.4 %
Platelets: 269 10*3/uL (ref 140–400)
RBC: 4.5 10*6/uL (ref 3.80–5.10)
RDW: 12.7 % (ref 11.0–15.0)
Total Lymphocyte: 30.1 %
WBC: 5.8 10*3/uL (ref 3.8–10.8)

## 2022-12-08 MED ORDER — CYANOCOBALAMIN 1000 MCG/ML IJ SOLN: 100.0000 ug | INTRAMUSCULAR | 9 refills | Status: DC

## 2022-12-08 NOTE — Progress Notes (Signed)
Subjective:    Patient ID: Norma Harmon, female    DOB: Aug 19, 1985, 37 y.o.   MRN: [id number]  HPI Patient is a very pleasant 37 year old Caucasian female here today for complete physical exam.  She also has a history of a partial colectomy for Crohn's colitis..  Patient works in Holiday representative.  She would like to update her tetanus shot today given that it is almost due.  She is not yet due for a mammogram.  She is not yet due for colonoscopy.  She denies any blood in her stool or abdominal pain.  Her Pap smear was performed last year by her gynecologist.  She does have a history of B12 deficiency secondary to a partial colectomy.  She has not been taking her B12 shots regularly.  She would like to check a B12 level because she feels tired.  She has not been taking her amlodipine consistently however her blood pressure today is outstanding at 126/82.  She does a very physical job and I imagine is dealing with dehydration in the summertime.  Therefore I asked her to monitor her blood pressure and as long as her blood pressure is less than 140/90, I am fine with her not taking the amlodipine.  I anticipate that her blood pressure likely will be higher in the wintertime when she is not sweating is much.  She is trying to reduce her smoking and is down to smoking just a few cigarettes every week.  I continue to encourage her to quit completely Past Medical History:  Diagnosis Date   ADHD (attention deficit hyperactivity disorder)    Allergic rhinitis    Anxiety    Arthritis    lumbar back   Colitis    history of partial colectomy due to Crohns   Crushing injury of left hand    Depression    Diarrhea    Endometriosis    Dr. Claib[facility]   Family history of malignant neoplasm of gastrointestinal tract    GERD (gastroesophageal reflux disease)    IBS (irritable bowel syndrome)    Lumbosacral radiculopathy at L4    left side, (Dr. [facility] group).    Migraine headache    Ovarian cyst     Pernicious anemia    PONV (postoperative nausea and vomiting)    Rheumatoid arthritis ([facility])    was seeing Dareen Piano but no longer   Skin cancer    abdomen and neck- states not melanoma    Past Surgical History:  Procedure Laterality Date   APPENDECTOMY  10/2008   COLON RESECTION  04/02/2011   Procedure: COLON RESECTION LAPAROSCOPIC;  Surgeon: [name], MD;  Location: [facility];  Service: General;  Laterality: N/A;  Laparscopic  Assisted Ileocecostomy    FRACTURE SURGERY     HAND SURGERY     left   NASAL SINUS SURGERY     SMALL INTESTINE SURGERY     SPINE SURGERY  08/2005   L5 - S1 fusion   WISDOM TOOTH EXTRACTION     Current Outpatient Medications on File Prior to Visit  Medication Sig Dispense Refill   amLODipine (NORVASC) 5 MG tablet Take 1 tablet (5 mg total) by mouth daily. 90 tablet 3   amphetamine-dextroamphetamine (ADDERALL) 30 MG tablet Take 1 tablet by mouth 2 (two) times daily. 60 tablet 0   cyanocobalamin (VITAMIN B12) 1000 MCG/ML injection Inject 0.1 mLs (100 mcg total) into the muscle every 30 (thirty) days. 3 mL 9   diclofenac Sodium (  VOLTAREN) 1 % GEL Apply 4 g topically 4 (four) times daily. 100 g 3   Levonorgestrel (SKYLA) 13.5 MG IUD Skyla 14 mcg/24 hrs (3 yrs) 13.5 mg intrauterine device  Take 1 device by intrauterine route.     SYRINGE-NEEDLE, DISP, 3 ML (BD ECLIPSE SYRINGE) 25G X 1" 3 ML MISC Use as directed to inject Vit B IM  month 50 each 0   No current facility-administered medications on file prior to visit.     Allergies  Allergen Reactions   Percocet [Oxycodone-Acetaminophen] Swelling   Soy Allergy     Abdominal pains   Social History   Socioeconomic History   Marital status: Divorced    Spouse name: Not on file   Number of children: 0   Years of education: Not on file   Highest education level: Not on file  Occupational History   Occupation: Radio producer    Employer: [employer]  Tobacco Use   Smoking status: Every Day     Current packs/day: 0.25    Average packs/day: 0.3 packs/day for 2.0 years (0.5 ttl pk-yrs)    Types: Cigarettes   Smokeless tobacco: Former    Quit date: 01/29/2011  Substance and Sexual Activity   Alcohol use: No    Comment: rarely   Drug use: No   Sexual activity: Yes    Birth control/protection: Other-see comments    Comment: nuvaring  Other Topics Concern   Not on file  Social History Narrative   Not on file   Social Determinants of Health   Financial Resource Strain: Not on file  Food Insecurity: Not on file  Transportation Needs: Not on file  Physical Activity: Not on file  Stress: Not on file  Social Connections: Not on file  Intimate Partner Violence: Not on file   Family History  Problem Relation Age of Onset   Colon cancer Paternal Grandfather    Breast cancer Paternal Grandmother    Colon polyps Paternal Grandmother    Cancer Paternal Grandmother        breast   Diabetes Maternal Grandmother    Heart disease Maternal Grandmother    Cirrhosis Maternal Grandfather        heavy drinker   ADD / ADHD Brother       Review of Systems  All other systems reviewed and are negative.      Objective:   Physical Exam Vitals reviewed.  Constitutional:      General: She is not in acute distress.    Appearance: She is well-developed. She is not diaphoretic.  HENT:     Head: Normocephalic and atraumatic.     Right Ear: External ear normal.     Left Ear: External ear normal.     Nose: Nose normal.     Mouth/Throat:     Pharynx: No oropharyngeal exudate.  Eyes:     Conjunctiva/sclera: Conjunctivae normal.     Pupils: Pupils are equal, round, and reactive to light.  Neck:     Thyroid: No thyromegaly.     Vascular: No JVD.  Cardiovascular:     Rate and Rhythm: Normal rate and regular rhythm.     Heart sounds: Normal heart sounds. No murmur heard. Pulmonary:     Effort: Pulmonary effort is normal. No respiratory distress.     Breath sounds: Normal breath  sounds. No wheezing or rales.  Abdominal:     General: Bowel sounds are normal. There is no distension.     Palpations:  Abdomen is soft.     Tenderness: There is no abdominal tenderness. There is no guarding or rebound.  Musculoskeletal:     Cervical back: Neck supple.  Lymphadenopathy:     Cervical: No cervical adenopathy.  Skin:    Coloration: Skin is not pale.     Findings: No erythema or rash.  Neurological:     Mental Status: She is alert and oriented to person, place, and time.     Cranial Nerves: No cranial nerve deficit.     Coordination: Coordination normal.     Deep Tendon Reflexes: Reflexes are normal and symmetric. Reflexes normal.  Psychiatric:        Mood and Affect: Mood normal.        Speech: Speech normal.        Behavior: Behavior normal.        Thought Content: Thought content normal.        Judgment: Judgment normal.          Assessment & Plan:  Benign essential HTN - Plan: CBC with Differential/Platelet, COMPLETE METABOLIC PANEL WITH GFR, Lipid panel  General medical exam - Plan: CBC with Differential/Platelet, COMPLETE METABOLIC PANEL WITH GFR, Lipid panel  Tobacco use disorder Patient's physical exam today is completely normal.  She is not yet due for mammogram or colonoscopy and her Pap smear is up-to-date.  She received a tetanus shot today.  Blood pressure is outstanding.  Check CBC CMP and a fasting lipid panel.  Goal LDL cholesterol is less than 100.  The remainder of her preventative care is up-to-date.  Continue to encourage smoking cessation and I am proud of the patient for try to quit

## 2022-12-14 ENCOUNTER — Other Ambulatory Visit: Payer: Self-pay | Admitting: Family Medicine

## 2022-12-22 MED ORDER — AMPHETAMINE-DEXTROAMPHETAMINE 30 MG PO TABS: 30.0000 mg | ORAL_TABLET | Freq: Two times a day (BID) | ORAL | 0 refills | Status: DC

## 2023-02-10 ENCOUNTER — Other Ambulatory Visit: Payer: Self-pay | Admitting: Family Medicine

## 2023-02-12 MED ORDER — AMPHETAMINE-DEXTROAMPHETAMINE 30 MG PO TABS: 30.0000 mg | ORAL_TABLET | Freq: Two times a day (BID) | ORAL | 0 refills | Status: DC

## 2023-03-11 ENCOUNTER — Other Ambulatory Visit: Payer: Self-pay | Admitting: Family Medicine

## 2023-03-23 ENCOUNTER — Other Ambulatory Visit: Payer: Self-pay | Admitting: Family Medicine

## 2023-03-25 ENCOUNTER — Encounter: Payer: Self-pay | Admitting: Family Medicine

## 2023-03-26 MED ORDER — AMPHETAMINE-DEXTROAMPHETAMINE 30 MG PO TABS: 30.0000 mg | ORAL_TABLET | Freq: Two times a day (BID) | ORAL | 0 refills | Status: DC

## 2023-05-09 ENCOUNTER — Other Ambulatory Visit: Payer: Self-pay | Admitting: Family Medicine

## 2023-05-12 MED ORDER — AMPHETAMINE-DEXTROAMPHETAMINE 30 MG PO TABS: 30.0000 mg | ORAL_TABLET | Freq: Two times a day (BID) | ORAL | 0 refills | Status: DC

## 2023-05-14 ENCOUNTER — Other Ambulatory Visit: Payer: Self-pay

## 2023-06-04 ENCOUNTER — Other Ambulatory Visit: Payer: Self-pay | Admitting: Family Medicine

## 2023-06-04 MED ORDER — AMPHETAMINE-DEXTROAMPHETAMINE 30 MG PO TABS: 30.0000 mg | ORAL_TABLET | Freq: Two times a day (BID) | ORAL | 0 refills | Status: DC

## 2023-06-09 ENCOUNTER — Other Ambulatory Visit: Payer: Self-pay | Admitting: Family Medicine

## 2023-06-09 MED ORDER — AMPHETAMINE-DEXTROAMPHETAMINE 30 MG PO TABS: 30.0000 mg | ORAL_TABLET | Freq: Two times a day (BID) | ORAL | 0 refills | Status: DC

## 2023-08-03 ENCOUNTER — Other Ambulatory Visit: Payer: Self-pay | Admitting: Family Medicine

## 2023-08-03 MED ORDER — AMPHETAMINE-DEXTROAMPHETAMINE 30 MG PO TABS: 30.0000 mg | ORAL_TABLET | Freq: Two times a day (BID) | ORAL | 0 refills | Status: DC

## 2023-09-07 ENCOUNTER — Other Ambulatory Visit: Payer: Self-pay | Admitting: Family Medicine

## 2023-09-07 MED ORDER — AMPHETAMINE-DEXTROAMPHETAMINE 30 MG PO TABS: 30.0000 mg | ORAL_TABLET | Freq: Two times a day (BID) | ORAL | 0 refills | Status: DC

## 2023-10-06 ENCOUNTER — Encounter: Payer: Self-pay | Admitting: Family Medicine

## 2023-10-06 ENCOUNTER — Other Ambulatory Visit: Payer: Self-pay

## 2023-10-06 MED ORDER — AMPHETAMINE-DEXTROAMPHETAMINE 30 MG PO TABS: 30.0000 mg | ORAL_TABLET | Freq: Two times a day (BID) | ORAL | 0 refills | Status: DC

## 2023-10-20 ENCOUNTER — Telehealth: Payer: Self-pay | Admitting: Physician Assistant

## 2023-10-20 DIAGNOSIS — H6692 Otitis media, unspecified, left ear: Secondary | ICD-10-CM

## 2023-10-20 MED ORDER — AMOXICILLIN 875 MG PO TABS: 875.0000 mg | ORAL_TABLET | Freq: Two times a day (BID) | ORAL | 0 refills | Status: AC

## 2023-10-20 NOTE — Progress Notes (Signed)
 E-Visit for Ear Pain - Acute Otitis Media   We are sorry that you are not feeling well. Here is how we plan to help!  Based on what you have shared with me it looks like you have Acute Otitis Media.  Acute Otitis Media is an infection of the middle or "inner" ear. This type of infection can cause redness, inflammation, and fluid buildup behind the tympanic membrane (ear drum).  The usual symptoms include: Earache/Pain Fever Upper respiratory symptoms Lack of energy/Fatigue/Malaise Slight hearing loss gradually worsening- if the inner ear fills with fluid What causes middle ear infections? Most middle ear infections occur when an infection such as a cold, leads to a build-up of mucus in the middle ear and causes the Eustachian tube (a thin tube that runs from the middle ear to the back of the nose) to become swollen or blocked.   This means mucus can't drain away properly, making it easier for an infection to spread into the middle ear.  How middle ear infections are treated: Most ear infections clear up within three to five days and don't need any specific treatment. If necessary, tylenol or ibuprofen should be used to relieve pain and a high temperature.  If you develop a fever higher than 102, or any significantly worsening symptoms, this could indicate a more serious infection moving to the middle/inner and needs face to face evaluation in an office by a provider.   Antibiotics aren't routinely used to treat middle ear infections, although they may occasionally be prescribed if symptoms persist or are particularly severe. Given your presentation,   I have prescribed Amoxicillin 875 mg one tablet twice daily for 10 days   Your symptoms should improve over the next 3 days and should resolve in about 7 days. Be sure to complete ALL of the prescription(s) given.  HOME CARE: Wash your hands frequently. If you are prescribed an ear drop, do not place the tip of the bottle on your ear or  touch it with your fingers. You can take Acetaminophen 650 mg every 4-6 hours as needed for pain.  If pain is severe or moderate, you can apply a heating pad (set on low) or hot water bottle (wrapped in a towel) to outer ear for 20 minutes.  This will also increase drainage.  GET HELP RIGHT AWAY IF: Fever is over 102.2 degrees. You develop progressive ear pain or hearing loss. Ear symptoms persist longer than 3 days after treatment.  MAKE SURE YOU: Understand these instructions. Will watch your condition. Will get help right away if you are not doing well or get worse.  Thank you for choosing an e-visit.  Your e-visit answers were reviewed by a board certified advanced clinical practitioner to complete your personal care plan. Depending upon the condition, your plan could have included both over the counter or prescription medications.  Please review your pharmacy choice. Make sure the pharmacy is open so you can pick up the prescription now. If there is a problem, you may contact your provider through [employer] and have the prescription routed to another pharmacy.  Your safety is important to [name]. If you have drug allergies check your prescription carefully.   For the next 24 hours you can use MyChart to ask questions about today's visit, request a non-urgent call back, or ask for a work or school excuse. You will get an email with a survey after your eVisit asking about your experience. We would appreciate your feedback. I hope  that your e-visit has been valuable and will aid in your recovery.

## 2023-10-20 NOTE — Progress Notes (Signed)
 I have spent 5 minutes in review of e-visit questionnaire, review and updating patient chart, medical decision making and response to patient.   Piedad Climes, PA-C

## 2023-11-02 ENCOUNTER — Encounter: Payer: Self-pay | Admitting: Family Medicine

## 2023-11-02 ENCOUNTER — Other Ambulatory Visit: Payer: Self-pay

## 2023-11-02 MED ORDER — AMPHETAMINE-DEXTROAMPHETAMINE 30 MG PO TABS: 30.0000 mg | ORAL_TABLET | Freq: Two times a day (BID) | ORAL | 0 refills | Status: DC

## 2023-12-01 ENCOUNTER — Other Ambulatory Visit: Payer: Self-pay

## 2023-12-01 ENCOUNTER — Encounter: Payer: Self-pay | Admitting: Family Medicine

## 2023-12-01 MED ORDER — AMPHETAMINE-DEXTROAMPHETAMINE 30 MG PO TABS: 30.0000 mg | ORAL_TABLET | Freq: Two times a day (BID) | ORAL | 0 refills | Status: DC

## 2023-12-08 ENCOUNTER — Ambulatory Visit (INDEPENDENT_AMBULATORY_CARE_PROVIDER_SITE_OTHER): Payer: Self-pay | Admitting: Family Medicine

## 2023-12-08 ENCOUNTER — Encounter: Payer: Self-pay | Admitting: Family Medicine

## 2023-12-08 VITALS — BP 160/110 | HR 92 | Temp 97.6°F | Ht 65.5 in | Wt 178.0 lb

## 2023-12-08 DIAGNOSIS — M1990 Unspecified osteoarthritis, unspecified site: Secondary | ICD-10-CM | POA: Insufficient documentation

## 2023-12-08 DIAGNOSIS — E538 Deficiency of other specified B group vitamins: Secondary | ICD-10-CM

## 2023-12-08 DIAGNOSIS — F17200 Nicotine dependence, unspecified, uncomplicated: Secondary | ICD-10-CM

## 2023-12-08 DIAGNOSIS — Z0000 Encounter for general adult medical examination without abnormal findings: Secondary | ICD-10-CM

## 2023-12-08 DIAGNOSIS — Z0001 Encounter for general adult medical examination with abnormal findings: Secondary | ICD-10-CM

## 2023-12-08 DIAGNOSIS — I10 Essential (primary) hypertension: Secondary | ICD-10-CM

## 2023-12-08 DIAGNOSIS — N6452 Nipple discharge: Secondary | ICD-10-CM | POA: Insufficient documentation

## 2023-12-08 LAB — CBC WITH DIFFERENTIAL/PLATELET
Absolute Lymphocytes: 1436 {cells}/uL (ref 850–3900)
Absolute Monocytes: 502 {cells}/uL (ref 200–950)
Basophils Absolute: 80 {cells}/uL (ref 0–200)
Basophils Relative: 1.4 %
Eosinophils Absolute: 182 {cells}/uL (ref 15–500)
Eosinophils Relative: 3.2 %
HCT: 42.4 % (ref 35.0–45.0)
Hemoglobin: 14.1 g/dL (ref 11.7–15.5)
MCH: 30.5 pg (ref 27.0–33.0)
MCHC: 33.3 g/dL (ref 32.0–36.0)
MCV: 91.8 fL (ref 80.0–100.0)
MPV: 9.6 fL (ref 7.5–12.5)
Monocytes Relative: 8.8 %
Neutro Abs: 3500 {cells}/uL (ref 1500–7800)
Neutrophils Relative %: 61.4 %
Platelets: 278 Thousand/uL (ref 140–400)
RBC: 4.62 Million/uL (ref 3.80–5.10)
RDW: 12.5 % (ref 11.0–15.0)
Total Lymphocyte: 25.2 %
WBC: 5.7 Thousand/uL (ref 3.8–10.8)

## 2023-12-08 LAB — COMPREHENSIVE METABOLIC PANEL WITH GFR
AG Ratio: 1.6 (calc) (ref 1.0–2.5)
ALT: 102 U/L — ABNORMAL HIGH (ref 6–29)
AST: 60 U/L — ABNORMAL HIGH (ref 10–30)
Albumin: 4.5 g/dL (ref 3.6–5.1)
Alkaline phosphatase (APISO): 56 U/L (ref 31–125)
BUN/Creatinine Ratio: 12 (calc) (ref 6–22)
BUN: 15 mg/dL (ref 7–25)
CO2: 25 mmol/L (ref 20–32)
Calcium: 9.4 mg/dL (ref 8.6–10.2)
Chloride: 103 mmol/L (ref 98–110)
Creat: 1.23 mg/dL — ABNORMAL HIGH (ref 0.50–0.97)
Globulin: 2.8 g/dL (ref 1.9–3.7)
Glucose, Bld: 87 mg/dL (ref 65–99)
Potassium: 4.7 mmol/L (ref 3.5–5.3)
Sodium: 138 mmol/L (ref 135–146)
Total Bilirubin: 0.7 mg/dL (ref 0.2–1.2)
Total Protein: 7.3 g/dL (ref 6.1–8.1)
eGFR: 58 mL/min/1.73m2 — ABNORMAL LOW (ref >=60)

## 2023-12-08 LAB — LIPID PANEL
Cholesterol: 258 mg/dL — ABNORMAL HIGH (ref <200)
HDL: 115 mg/dL (ref >=50)
LDL Cholesterol (Calc): 129 mg/dL — ABNORMAL HIGH
Non-HDL Cholesterol (Calc): 143 mg/dL — ABNORMAL HIGH (ref <130)
Total CHOL/HDL Ratio: 2.2 (calc) (ref <5.0)
Triglycerides: 57 mg/dL (ref <150)

## 2023-12-08 LAB — VITAMIN B12: Vitamin B-12: 416 pg/mL (ref 200–1100)

## 2023-12-08 MED ORDER — CEPHALEXIN 500 MG PO CAPS: 500.0000 mg | ORAL_CAPSULE | Freq: Three times a day (TID) | ORAL | 0 refills | Status: AC

## 2023-12-08 MED ORDER — AMLODIPINE BESYLATE 5 MG PO TABS: 5.0000 mg | ORAL_TABLET | Freq: Every day | ORAL | 3 refills | Status: AC

## 2023-12-08 MED ORDER — FLUCONAZOLE 150 MG PO TABS: 150.0000 mg | ORAL_TABLET | Freq: Once | ORAL | 0 refills | Status: AC

## 2023-12-08 NOTE — Progress Notes (Signed)
 Subjective:    Patient ID: Norma Harmon, female    DOB: Apr 24, 1986, 38 y.o.   MRN: [id number]  HPI Patient is a very pleasant 38 year old Caucasian female here today for complete physical exam.  She also has a history of a partial colectomy for Crohn's colitis..  She is not yet due for a mammogram.  She is not yet due for colonoscopy.  She denies any blood in her stool or abdominal pain.  Her Pap smear was performed last year by her gynecologist.  She does have a history of B12 deficiency secondary to a partial colectomy.  Her blood pressure is extremely high today.  She is not checking her blood pressure at home and she is not taking her amlodipine .  She denies any chest pain shortness of breath or dyspnea on exertion. Past Medical History:  Diagnosis Date   ADHD (attention deficit hyperactivity disorder)    Allergic rhinitis    Anxiety    Arthritis    lumbar back   Colitis    history of partial colectomy due to Crohns   Crushing injury of left hand    Depression    Diarrhea    Endometriosis    Dr. [facility]   Family history of malignant neoplasm of gastrointestinal tract    GERD (gastroesophageal reflux disease)    Hypertension    IBS (irritable bowel syndrome)    Lumbosacral radiculopathy at L4    left side, ([facility]. [facility]).    Migraine headache    Ovarian cyst    Pernicious anemia    PONV (postoperative nausea and vomiting)    Rheumatoid arthritis (HCC)    was seeing [facility] but no longer   Skin cancer    abdomen and neck- states not melanoma    Past Surgical History:  Procedure Laterality Date   APPENDECTOMY  10/2008   COLON RESECTION  04/02/2011   Procedure: COLON RESECTION LAPAROSCOPIC;  Surgeon: Donnice KATHEE Lunger, MD;  Location: [facility];  Service: General;  Laterality: N/A;  Laparscopic  Assisted Ileocecostomy    FRACTURE SURGERY     HAND SURGERY     left   NASAL SINUS SURGERY     SMALL INTESTINE SURGERY     SPINE SURGERY  08/2005   L5 - S1 fusion    WISDOM TOOTH EXTRACTION     Current Outpatient Medications on File Prior to Visit  Medication Sig Dispense Refill   amLODipine  (NORVASC ) 5 MG tablet Take 1 tablet (5 mg total) by mouth daily. (Patient not taking: Reported on 12/08/2022) 90 tablet 3   amphetamine -dextroamphetamine  (ADDERALL) 30 MG tablet Take 1 tablet by mouth 2 (two) times daily. 60 tablet 0   cyanocobalamin  (VITAMIN B12) 1000 MCG/ML injection Inject 0.1 mLs (100 mcg total) into the muscle every 30 (thirty) days. 3 mL 9   diclofenac  Sodium (VOLTAREN ) 1 % GEL Apply 4 g topically 4 (four) times daily. 100 g 3   Levonorgestrel  (SKYLA ) 13.5 MG IUD Skyla  14 mcg/24 hrs (3 yrs) 13.5 mg intrauterine device  Take 1 device by intrauterine route.     SYRINGE-NEEDLE, DISP, 3 ML (BD ECLIPSE SYRINGE) 25G X 1 3 ML MISC Use as directed to inject Vit B IM  month 50 each 0   No current facility-administered medications on file prior to visit.     Allergies  Allergen Reactions   Percocet Enoc.Eves ] Swelling   Soy Allergy (Obsolete)     Abdominal pains   Social History  Socioeconomic History   Marital status: Divorced    Spouse name: Not on file   Number of children: 0   Years of education: Not on file   Highest education level: Not on file  Occupational History   Occupation: Radio producer    Employer: [employer]  Tobacco Use   Smoking status: Every Day    Current packs/day: 0.25    Average packs/day: 0.3 packs/day for 2.0 years (0.5 ttl pk-yrs)    Types: Cigarettes   Smokeless tobacco: Former    Quit date: 01/29/2011  Substance and Sexual Activity   Alcohol use: No    Comment: rarely   Drug use: No   Sexual activity: Yes    Birth control/protection: Other-see comments    Comment: nuvaring  Other Topics Concern   Not on file  Social History Narrative   Not on file   Social Drivers of Health   Financial Resource Strain: Patient Declined (12/08/2023)   Overall Financial Resource Strain  (CARDIA)    Difficulty of Paying Living Expenses: Patient declined  Food Insecurity: Patient Declined (12/08/2023)   Hunger Vital Sign    Worried About Running Out of Food in the Last Year: Patient declined    Ran Out of Food in the Last Year: Patient declined  Transportation Needs: Patient Declined (12/08/2023)   PRAPARE - Administrator, Civil Service (Medical): Patient declined    Lack of Transportation (Non-Medical): Patient declined  Physical Activity: Not on file  Stress: Not on file  Social Connections: Unknown (12/08/2023)   Social Connection and Isolation Panel    Frequency of Communication with Friends and Family: Patient declined    Frequency of Social Gatherings with Friends and Family: Patient declined    Attends Religious Services: Patient declined    Database administrator or Organizations: Patient declined    Attends Engineer, structural: Not on file    Marital Status: Patient declined  Catering manager Violence: Not on file   Family History  Problem Relation Age of Onset   Colon cancer Paternal Grandfather    Breast cancer Paternal Grandmother    Colon polyps Paternal Grandmother    Cancer Paternal Grandmother        breast   Diabetes Maternal Grandmother    Heart disease Maternal Grandmother    Cirrhosis Maternal Grandfather        heavy drinker   ADD / ADHD Brother       Review of Systems  All other systems reviewed and are negative.      Objective:   Physical Exam Vitals reviewed.  Constitutional:      General: She is not in acute distress.    Appearance: She is well-developed. She is not diaphoretic.  HENT:     Head: Normocephalic and atraumatic.     Right Ear: External ear normal.     Left Ear: External ear normal.     Nose: Nose normal.     Mouth/Throat:     Pharynx: No oropharyngeal exudate.  Eyes:     Conjunctiva/sclera: Conjunctivae normal.     Pupils: Pupils are equal, round, and reactive to light.  Neck:      Thyroid : No thyromegaly.     Vascular: No JVD.  Cardiovascular:     Rate and Rhythm: Normal rate and regular rhythm.     Heart sounds: Normal heart sounds. No murmur heard. Pulmonary:     Effort: Pulmonary effort is normal. No respiratory distress.  Breath sounds: Normal breath sounds. No wheezing or rales.  Abdominal:     General: Bowel sounds are normal. There is no distension.     Palpations: Abdomen is soft.     Tenderness: There is no abdominal tenderness. There is no guarding or rebound.  Musculoskeletal:     Cervical back: Neck supple.  Lymphadenopathy:     Cervical: No cervical adenopathy.  Skin:    Coloration: Skin is not pale.     Findings: No erythema or rash.  Neurological:     Mental Status: She is alert and oriented to person, place, and time.     Cranial Nerves: No cranial nerve deficit.     Coordination: Coordination normal.     Deep Tendon Reflexes: Reflexes are normal and symmetric. Reflexes normal.  Psychiatric:        Mood and Affect: Mood normal.        Speech: Speech normal.        Behavior: Behavior normal.        Thought Content: Thought content normal.        Judgment: Judgment normal.          Assessment & Plan:  General medical exam - Plan: CBC with Differential/Platelet, Comprehensive metabolic panel with GFR, Lipid panel, Vitamin B12  Tobacco use disorder  B12 deficiency - Plan: Vitamin B12  Benign essential HTN - Plan: CBC with Differential/Platelet, Comprehensive metabolic panel with GFR, Lipid panel, US  Renal Artery Stenosis I have verified her blood pressure personally.  I recommended obtaining a renal artery ultrasound to rule out renal artery stenosis.  Meanwhile start the patient back on amlodipine  5 mg a day and recheck blood pressure in 2 weeks.  Patient believes blood pressure is falsely elevated due to whitecoat syndrome.  Hence I am only starting her on 5 mg of amlodipine .  If blood pressure is not less 40/90 in 2 weeks I would  recommend increasing the dose or adding additional medication.  Patient has quit smoking now for 1 year.  I congratulated her on this.  I will check a vitamin B12 level.  I will also check a CBC a CMP and lipid panel.  Recommended holding Adderall until her blood pressure is better controlled

## 2023-12-10 ENCOUNTER — Ambulatory Visit: Payer: Self-pay | Admitting: Family Medicine

## 2023-12-30 ENCOUNTER — Encounter: Payer: Self-pay | Admitting: Family Medicine

## 2023-12-31 ENCOUNTER — Other Ambulatory Visit: Payer: Self-pay

## 2023-12-31 MED ORDER — AMPHETAMINE-DEXTROAMPHETAMINE 30 MG PO TABS: 30.0000 mg | ORAL_TABLET | Freq: Two times a day (BID) | ORAL | 0 refills | Status: DC

## 2024-01-04 ENCOUNTER — Other Ambulatory Visit: Payer: Self-pay | Admitting: Family Medicine

## 2024-01-04 DIAGNOSIS — N1831 Chronic kidney disease, stage 3a: Secondary | ICD-10-CM

## 2024-01-14 ENCOUNTER — Other Ambulatory Visit (INDEPENDENT_AMBULATORY_CARE_PROVIDER_SITE_OTHER): Payer: Self-pay

## 2024-01-14 DIAGNOSIS — R7989 Other specified abnormal findings of blood chemistry: Secondary | ICD-10-CM

## 2024-01-14 DIAGNOSIS — I10 Essential (primary) hypertension: Secondary | ICD-10-CM

## 2024-01-14 DIAGNOSIS — N1831 Chronic kidney disease, stage 3a: Secondary | ICD-10-CM

## 2024-01-15 ENCOUNTER — Ambulatory Visit: Payer: Self-pay | Admitting: Family Medicine

## 2024-01-15 ENCOUNTER — Other Ambulatory Visit

## 2024-01-15 LAB — MICROALBUMIN / CREATININE URINE RATIO
Creatinine, Urine: 202 mg/dL (ref 20–275)
Microalb Creat Ratio: 4 mg/g{creat} (ref <30)
Microalb, Ur: 0.8 mg/dL

## 2024-01-15 LAB — URINALYSIS, ROUTINE W REFLEX MICROSCOPIC
Bilirubin Urine: NEGATIVE
Glucose, UA: NEGATIVE
Hgb urine dipstick: NEGATIVE
Hyaline Cast: NONE SEEN /LPF
Ketones, ur: NEGATIVE
Nitrite: NEGATIVE
Protein, ur: NEGATIVE
RBC / HPF: NONE SEEN /HPF (ref 0–2)
Specific Gravity, Urine: 1.023 (ref 1.001–1.035)
pH: <=5 — AB (ref 5.0–8.0)

## 2024-01-15 LAB — RENAL FUNCTION PANEL
Albumin: 4.6 g/dL (ref 3.6–5.1)
BUN/Creatinine Ratio: 16 (calc) (ref 6–22)
BUN: 17 mg/dL (ref 7–25)
CO2: 25 mmol/L (ref 20–32)
Calcium: 9.6 mg/dL (ref 8.6–10.2)
Chloride: 103 mmol/L (ref 98–110)
Creat: 1.08 mg/dL — ABNORMAL HIGH (ref 0.50–0.97)
Glucose, Bld: 122 mg/dL — ABNORMAL HIGH (ref 65–99)
Phosphorus: 3.5 mg/dL (ref 2.5–4.5)
Potassium: 3.7 mmol/L (ref 3.5–5.3)
Sodium: 137 mmol/L (ref 135–146)

## 2024-01-15 LAB — MICROSCOPIC MESSAGE

## 2024-01-16 LAB — HEPATIC FUNCTION PANEL
AG Ratio: 1.7 (calc) (ref 1.0–2.5)
ALT: 72 U/L — ABNORMAL HIGH (ref 6–29)
AST: 40 U/L — ABNORMAL HIGH (ref 10–30)
Albumin: 4.7 g/dL (ref 3.6–5.1)
Alkaline phosphatase (APISO): 48 U/L (ref 31–125)
Bilirubin, Direct: 0.1 mg/dL (ref 0.0–0.2)
Globulin: 2.7 g/dL (ref 1.9–3.7)
Indirect Bilirubin: 0.2 mg/dL (ref 0.2–1.2)
Total Bilirubin: 0.3 mg/dL (ref 0.2–1.2)
Total Protein: 7.4 g/dL (ref 6.1–8.1)

## 2024-01-19 ENCOUNTER — Other Ambulatory Visit

## 2024-01-21 ENCOUNTER — Encounter: Payer: Self-pay | Admitting: Family Medicine

## 2024-01-22 ENCOUNTER — Other Ambulatory Visit: Payer: Self-pay | Admitting: Family Medicine

## 2024-01-22 MED ORDER — AMPHETAMINE-DEXTROAMPHETAMINE 30 MG PO TABS: 30.0000 mg | ORAL_TABLET | Freq: Two times a day (BID) | ORAL | 0 refills | Status: DC

## 2024-01-25 ENCOUNTER — Other Ambulatory Visit

## 2024-02-25 ENCOUNTER — Encounter: Payer: Self-pay | Admitting: Family Medicine

## 2024-02-26 ENCOUNTER — Other Ambulatory Visit: Payer: Self-pay

## 2024-02-26 MED ORDER — AMPHETAMINE-DEXTROAMPHETAMINE 30 MG PO TABS: 30.0000 mg | ORAL_TABLET | Freq: Two times a day (BID) | ORAL | 0 refills | Status: DC

## 2024-02-28 ENCOUNTER — Other Ambulatory Visit: Payer: Self-pay | Admitting: Family Medicine

## 2024-03-21 ENCOUNTER — Other Ambulatory Visit: Payer: Self-pay | Admitting: Family Medicine

## 2024-03-21 ENCOUNTER — Encounter: Payer: Self-pay | Admitting: Family Medicine

## 2024-03-21 MED ORDER — AMPHETAMINE-DEXTROAMPHETAMINE 30 MG PO TABS: 30.0000 mg | ORAL_TABLET | Freq: Two times a day (BID) | ORAL | 0 refills | Status: DC

## 2024-03-28 ENCOUNTER — Encounter: Payer: Self-pay | Admitting: Family Medicine

## 2024-04-18 ENCOUNTER — Encounter: Payer: Self-pay | Admitting: Family Medicine

## 2024-04-19 ENCOUNTER — Other Ambulatory Visit: Payer: Self-pay | Admitting: Family Medicine

## 2024-04-19 MED ORDER — AMPHETAMINE-DEXTROAMPHETAMINE 30 MG PO TABS: 30.0000 mg | ORAL_TABLET | Freq: Two times a day (BID) | ORAL | 0 refills | Status: DC

## 2024-05-25 ENCOUNTER — Encounter: Payer: Self-pay | Admitting: Family Medicine

## 2024-05-26 ENCOUNTER — Other Ambulatory Visit: Payer: Self-pay

## 2024-05-26 MED ORDER — AMPHETAMINE-DEXTROAMPHETAMINE 30 MG PO TABS: 30.0000 mg | ORAL_TABLET | Freq: Two times a day (BID) | ORAL | 0 refills | Status: AC

## 2024-12-08 ENCOUNTER — Encounter: Admitting: Family Medicine
